# Patient Record
Sex: Male | Born: 1935 | Race: Black or African American | Hispanic: No | Marital: Single | Smoking: Former smoker
Health system: Southern US, Community
[De-identification: ages and names within clinical notes are randomized; demographics above are authoritative.]

## PROBLEM LIST (undated history)

## (undated) DIAGNOSIS — E785 Hyperlipidemia, unspecified: Secondary | ICD-10-CM

## (undated) DIAGNOSIS — K648 Other hemorrhoids: Secondary | ICD-10-CM

## (undated) DIAGNOSIS — N400 Enlarged prostate without lower urinary tract symptoms: Secondary | ICD-10-CM

## (undated) DIAGNOSIS — K219 Gastro-esophageal reflux disease without esophagitis: Secondary | ICD-10-CM

## (undated) DIAGNOSIS — R0989 Other specified symptoms and signs involving the circulatory and respiratory systems: Secondary | ICD-10-CM

## (undated) DIAGNOSIS — K5730 Diverticulosis of large intestine without perforation or abscess without bleeding: Secondary | ICD-10-CM

## (undated) DIAGNOSIS — M542 Cervicalgia: Secondary | ICD-10-CM

## (undated) DIAGNOSIS — Z87891 Personal history of nicotine dependence: Secondary | ICD-10-CM

## (undated) DIAGNOSIS — I2699 Other pulmonary embolism without acute cor pulmonale: Secondary | ICD-10-CM

## (undated) DIAGNOSIS — R519 Headache, unspecified: Secondary | ICD-10-CM

## (undated) DIAGNOSIS — Z86010 Personal history of colonic polyps: Secondary | ICD-10-CM

## (undated) DIAGNOSIS — I739 Peripheral vascular disease, unspecified: Secondary | ICD-10-CM

## (undated) DIAGNOSIS — D126 Benign neoplasm of colon, unspecified: Secondary | ICD-10-CM

## (undated) DIAGNOSIS — N419 Inflammatory disease of prostate, unspecified: Secondary | ICD-10-CM

## (undated) DIAGNOSIS — M1990 Unspecified osteoarthritis, unspecified site: Secondary | ICD-10-CM

## (undated) DIAGNOSIS — R109 Unspecified abdominal pain: Secondary | ICD-10-CM

## (undated) HISTORY — DX: Unspecified abdominal pain: R10.9

## (undated) HISTORY — DX: Peripheral vascular disease, unspecified: I73.9

## (undated) HISTORY — DX: Other specified symptoms and signs involving the circulatory and respiratory systems: R09.89

## (undated) HISTORY — DX: Other hemorrhoids: K64.8

## (undated) HISTORY — DX: Diverticulosis of large intestine without perforation or abscess without bleeding: K57.30

## (undated) HISTORY — DX: Benign neoplasm of colon, unspecified: D12.6

## (undated) HISTORY — PX: TRANSURETHRAL RESECTION OF PROSTATE: SHX73

## (undated) HISTORY — DX: Personal history of colonic polyps: Z86.010

## (undated) HISTORY — PX: OTHER SURGICAL HISTORY: SHX169

## (undated) HISTORY — DX: Hyperlipidemia, unspecified: E78.5

## (undated) HISTORY — DX: Gastro-esophageal reflux disease without esophagitis: K21.9

## (undated) HISTORY — DX: Inflammatory disease of prostate, unspecified: N41.9

## (undated) HISTORY — DX: Benign prostatic hyperplasia without lower urinary tract symptoms: N40.0

## (undated) HISTORY — DX: Headache, unspecified: R51.9

## (undated) HISTORY — DX: Personal history of nicotine dependence: Z87.891

## (undated) HISTORY — DX: Cervicalgia: M54.2

## (undated) HISTORY — DX: Unspecified osteoarthritis, unspecified site: M19.90

---

## 1993-02-11 ENCOUNTER — Encounter (INDEPENDENT_AMBULATORY_CARE_PROVIDER_SITE_OTHER): Payer: Self-pay | Admitting: Gastroenterology

## 2002-12-27 ENCOUNTER — Encounter: Payer: Self-pay | Admitting: Emergency Medicine

## 2002-12-27 ENCOUNTER — Emergency Department (HOSPITAL_COMMUNITY): Admission: EM | Admit: 2002-12-27 | Discharge: 2002-12-27 | Payer: Self-pay | Admitting: Emergency Medicine

## 2004-05-31 ENCOUNTER — Emergency Department (HOSPITAL_COMMUNITY): Admission: EM | Admit: 2004-05-31 | Discharge: 2004-05-31 | Payer: Self-pay

## 2004-08-30 ENCOUNTER — Ambulatory Visit (HOSPITAL_COMMUNITY): Admission: RE | Admit: 2004-08-30 | Discharge: 2004-08-30 | Payer: Self-pay | Admitting: Internal Medicine

## 2004-09-08 ENCOUNTER — Ambulatory Visit (HOSPITAL_COMMUNITY): Admission: RE | Admit: 2004-09-08 | Discharge: 2004-09-08 | Payer: Self-pay | Admitting: Internal Medicine

## 2005-03-07 ENCOUNTER — Ambulatory Visit: Payer: Self-pay | Admitting: Internal Medicine

## 2005-03-18 ENCOUNTER — Ambulatory Visit: Payer: Self-pay | Admitting: Cardiology

## 2005-07-04 ENCOUNTER — Ambulatory Visit: Payer: Self-pay | Admitting: Internal Medicine

## 2005-08-04 ENCOUNTER — Emergency Department (HOSPITAL_COMMUNITY): Admission: EM | Admit: 2005-08-04 | Discharge: 2005-08-04 | Payer: Self-pay | Admitting: Emergency Medicine

## 2005-11-14 ENCOUNTER — Ambulatory Visit: Payer: Self-pay | Admitting: Internal Medicine

## 2005-11-16 ENCOUNTER — Encounter: Payer: Self-pay | Admitting: Cardiology

## 2005-11-16 ENCOUNTER — Ambulatory Visit: Payer: Self-pay

## 2005-12-29 ENCOUNTER — Ambulatory Visit: Payer: Self-pay | Admitting: Internal Medicine

## 2006-02-21 ENCOUNTER — Ambulatory Visit: Payer: Self-pay | Admitting: Gastroenterology

## 2006-04-21 ENCOUNTER — Ambulatory Visit: Payer: Self-pay | Admitting: Internal Medicine

## 2006-04-26 ENCOUNTER — Ambulatory Visit: Payer: Self-pay | Admitting: *Deleted

## 2006-07-06 ENCOUNTER — Ambulatory Visit: Payer: Self-pay | Admitting: Internal Medicine

## 2006-10-03 ENCOUNTER — Ambulatory Visit: Payer: Self-pay | Admitting: Internal Medicine

## 2006-10-16 ENCOUNTER — Ambulatory Visit: Payer: Self-pay | Admitting: Gastroenterology

## 2006-10-27 ENCOUNTER — Ambulatory Visit: Payer: Self-pay | Admitting: Gastroenterology

## 2006-10-27 ENCOUNTER — Encounter: Payer: Self-pay | Admitting: Gastroenterology

## 2006-11-06 ENCOUNTER — Ambulatory Visit: Payer: Self-pay | Admitting: Internal Medicine

## 2006-12-21 ENCOUNTER — Ambulatory Visit: Payer: Self-pay | Admitting: Internal Medicine

## 2007-06-18 ENCOUNTER — Telehealth: Payer: Self-pay | Admitting: Internal Medicine

## 2007-06-20 ENCOUNTER — Encounter: Payer: Self-pay | Admitting: Internal Medicine

## 2007-07-27 ENCOUNTER — Ambulatory Visit: Payer: Self-pay | Admitting: Gastroenterology

## 2007-08-29 ENCOUNTER — Ambulatory Visit: Payer: Self-pay | Admitting: Gastroenterology

## 2008-01-04 ENCOUNTER — Encounter: Payer: Self-pay | Admitting: Gastroenterology

## 2008-01-04 DIAGNOSIS — N419 Inflammatory disease of prostate, unspecified: Secondary | ICD-10-CM

## 2008-01-04 DIAGNOSIS — K648 Other hemorrhoids: Secondary | ICD-10-CM

## 2008-01-04 DIAGNOSIS — D126 Benign neoplasm of colon, unspecified: Secondary | ICD-10-CM

## 2008-01-04 DIAGNOSIS — R109 Unspecified abdominal pain: Secondary | ICD-10-CM

## 2008-01-04 DIAGNOSIS — K5730 Diverticulosis of large intestine without perforation or abscess without bleeding: Secondary | ICD-10-CM | POA: Insufficient documentation

## 2008-01-04 HISTORY — DX: Benign neoplasm of colon, unspecified: D12.6

## 2008-01-04 HISTORY — DX: Inflammatory disease of prostate, unspecified: N41.9

## 2008-01-04 HISTORY — DX: Diverticulosis of large intestine without perforation or abscess without bleeding: K57.30

## 2008-01-04 HISTORY — DX: Other hemorrhoids: K64.8

## 2008-02-13 ENCOUNTER — Ambulatory Visit: Payer: Self-pay | Admitting: Internal Medicine

## 2008-02-13 DIAGNOSIS — N400 Enlarged prostate without lower urinary tract symptoms: Secondary | ICD-10-CM

## 2008-02-13 HISTORY — DX: Benign prostatic hyperplasia without lower urinary tract symptoms: N40.0

## 2008-07-16 ENCOUNTER — Ambulatory Visit: Payer: Self-pay | Admitting: Internal Medicine

## 2008-07-16 DIAGNOSIS — M65839 Other synovitis and tenosynovitis, unspecified forearm: Secondary | ICD-10-CM

## 2008-07-16 DIAGNOSIS — M65849 Other synovitis and tenosynovitis, unspecified hand: Secondary | ICD-10-CM

## 2008-08-13 ENCOUNTER — Encounter: Payer: Self-pay | Admitting: Internal Medicine

## 2008-08-14 ENCOUNTER — Ambulatory Visit: Payer: Self-pay | Admitting: Internal Medicine

## 2008-08-14 DIAGNOSIS — Z860100 Personal history of colon polyps, unspecified: Secondary | ICD-10-CM | POA: Insufficient documentation

## 2008-08-14 LAB — CONVERTED CEMR LAB
ALT: 12 units/L (ref 0–53)
AST: 21 units/L (ref 0–37)
Albumin: 3.6 g/dL (ref 3.5–5.2)
Alkaline Phosphatase: 68 units/L (ref 39–117)
BUN: 20 mg/dL (ref 6–23)
Basophils Absolute: 0 10*3/uL (ref 0.0–0.1)
Basophils Relative: 0.1 % (ref 0.0–3.0)
Bilirubin, Direct: 0.1 mg/dL (ref 0.0–0.3)
CO2: 29 meq/L (ref 19–32)
Calcium: 9.2 mg/dL (ref 8.4–10.5)
Chloride: 106 meq/L (ref 96–112)
Cholesterol: 188 mg/dL (ref 0–200)
Creatinine, Ser: 1.1 mg/dL (ref 0.4–1.5)
Eosinophils Absolute: 0.1 10*3/uL (ref 0.0–0.7)
Eosinophils Relative: 1.8 % (ref 0.0–5.0)
GFR calc Af Amer: 84 mL/min
GFR calc non Af Amer: 69 mL/min
Glucose, Bld: 84 mg/dL (ref 70–99)
HCT: 40.6 % (ref 39.0–52.0)
HDL: 71.2 mg/dL (ref >39.0)
Hemoglobin: 13.7 g/dL (ref 13.0–17.0)
LDL Cholesterol: 107 mg/dL — ABNORMAL HIGH (ref 0–99)
Lymphocytes Relative: 35.3 % (ref 12.0–46.0)
MCHC: 33.7 g/dL (ref 30.0–36.0)
MCV: 88.9 fL (ref 78.0–100.0)
Monocytes Absolute: 0.5 10*3/uL (ref 0.1–1.0)
Monocytes Relative: 10.9 % (ref 3.0–12.0)
Neutro Abs: 2.3 10*3/uL (ref 1.4–7.7)
Neutrophils Relative %: 51.9 % (ref 43.0–77.0)
PSA: 0.15 ng/mL (ref 0.10–4.00)
Platelets: 189 10*3/uL (ref 150–400)
Potassium: 3.9 meq/L (ref 3.5–5.1)
RBC: 4.57 M/uL (ref 4.22–5.81)
RDW: 13.2 % (ref 11.5–14.6)
Sodium: 141 meq/L (ref 135–145)
TSH: 1.24 microintl units/mL (ref 0.35–5.50)
Total Bilirubin: 0.9 mg/dL (ref 0.3–1.2)
Total CHOL/HDL Ratio: 2.6
Total Protein: 6.8 g/dL (ref 6.0–8.3)
Triglycerides: 48 mg/dL (ref 0–149)
VLDL: 10 mg/dL (ref 0–40)
WBC: 4.5 10*3/uL (ref 4.5–10.5)

## 2008-11-14 ENCOUNTER — Ambulatory Visit: Payer: Self-pay | Admitting: Internal Medicine

## 2008-11-14 DIAGNOSIS — R109 Unspecified abdominal pain: Secondary | ICD-10-CM | POA: Insufficient documentation

## 2008-11-17 ENCOUNTER — Ambulatory Visit: Payer: Self-pay | Admitting: Internal Medicine

## 2008-11-17 LAB — CONVERTED CEMR LAB
BUN: 15 mg/dL (ref 6–23)
BUN: 15 mg/dL (ref 6–23)
Creatinine, Ser: 1.1 mg/dL (ref 0.4–1.5)
Creatinine, Ser: 1 mg/dL (ref 0.4–1.5)

## 2008-11-19 ENCOUNTER — Ambulatory Visit: Payer: Self-pay | Admitting: Cardiology

## 2009-06-22 ENCOUNTER — Encounter: Payer: Self-pay | Admitting: Internal Medicine

## 2009-08-14 ENCOUNTER — Ambulatory Visit: Payer: Self-pay | Admitting: Internal Medicine

## 2009-08-14 DIAGNOSIS — Z87891 Personal history of nicotine dependence: Secondary | ICD-10-CM | POA: Insufficient documentation

## 2009-08-14 HISTORY — DX: Personal history of nicotine dependence: Z87.891

## 2009-08-14 LAB — CONVERTED CEMR LAB
ALT: 14 units/L (ref 0–53)
AST: 20 units/L (ref 0–37)
Albumin: 3.6 g/dL (ref 3.5–5.2)
Alkaline Phosphatase: 73 units/L (ref 39–117)
BUN: 15 mg/dL (ref 6–23)
Basophils Absolute: 0 10*3/uL (ref 0.0–0.1)
Basophils Relative: 0.6 % (ref 0.0–3.0)
Bilirubin, Direct: 0.1 mg/dL (ref 0.0–0.3)
CO2: 22 meq/L (ref 19–32)
Calcium: 9.4 mg/dL (ref 8.4–10.5)
Chloride: 104 meq/L (ref 96–112)
Cholesterol: 203 mg/dL — ABNORMAL HIGH (ref 0–200)
Creatinine, Ser: 1.2 mg/dL (ref 0.4–1.5)
Direct LDL: 111 mg/dL
Eosinophils Absolute: 0.1 10*3/uL (ref 0.0–0.7)
Eosinophils Relative: 1.6 % (ref 0.0–5.0)
GFR calc non Af Amer: 75.62 mL/min (ref >60)
Glucose, Bld: 108 mg/dL — ABNORMAL HIGH (ref 70–99)
HCT: 41 % (ref 39.0–52.0)
HDL: 78.9 mg/dL (ref >39.00)
Hemoglobin: 13.7 g/dL (ref 13.0–17.0)
Lymphocytes Relative: 26.7 % (ref 12.0–46.0)
Lymphs Abs: 1.4 10*3/uL (ref 0.7–4.0)
MCHC: 33.4 g/dL (ref 30.0–36.0)
MCV: 90.4 fL (ref 78.0–100.0)
Monocytes Absolute: 0.4 10*3/uL (ref 0.1–1.0)
Monocytes Relative: 7.7 % (ref 3.0–12.0)
Neutro Abs: 3.5 10*3/uL (ref 1.4–7.7)
Neutrophils Relative %: 63.4 % (ref 43.0–77.0)
Platelets: 200 10*3/uL (ref 150.0–400.0)
Potassium: 4.5 meq/L (ref 3.5–5.1)
RBC: 4.53 M/uL (ref 4.22–5.81)
RDW: 13.6 % (ref 11.5–14.6)
Sodium: 139 meq/L (ref 135–145)
TSH: 0.78 microintl units/mL (ref 0.35–5.50)
Total Bilirubin: 0.9 mg/dL (ref 0.3–1.2)
Total CHOL/HDL Ratio: 3
Total Protein: 6.6 g/dL (ref 6.0–8.3)
Triglycerides: 50 mg/dL (ref 0.0–149.0)
VLDL: 10 mg/dL (ref 0.0–40.0)
WBC: 5.4 10*3/uL (ref 4.5–10.5)

## 2009-10-07 ENCOUNTER — Encounter (INDEPENDENT_AMBULATORY_CARE_PROVIDER_SITE_OTHER): Payer: Self-pay | Admitting: *Deleted

## 2010-02-03 ENCOUNTER — Encounter: Payer: Self-pay | Admitting: Internal Medicine

## 2010-05-26 ENCOUNTER — Encounter: Payer: Self-pay | Admitting: Internal Medicine

## 2010-06-30 ENCOUNTER — Ambulatory Visit: Payer: Self-pay | Admitting: Internal Medicine

## 2010-06-30 DIAGNOSIS — J029 Acute pharyngitis, unspecified: Secondary | ICD-10-CM

## 2010-06-30 DIAGNOSIS — M542 Cervicalgia: Secondary | ICD-10-CM | POA: Insufficient documentation

## 2010-06-30 LAB — CONVERTED CEMR LAB: Rapid Strep: NEGATIVE

## 2010-07-01 ENCOUNTER — Encounter: Payer: Self-pay | Admitting: Internal Medicine

## 2010-07-01 DIAGNOSIS — R0989 Other specified symptoms and signs involving the circulatory and respiratory systems: Secondary | ICD-10-CM

## 2010-07-02 ENCOUNTER — Ambulatory Visit: Payer: Self-pay

## 2010-07-02 ENCOUNTER — Encounter: Payer: Self-pay | Admitting: Internal Medicine

## 2010-08-17 ENCOUNTER — Ambulatory Visit: Payer: Self-pay | Admitting: Internal Medicine

## 2010-08-17 ENCOUNTER — Encounter: Payer: Self-pay | Admitting: Internal Medicine

## 2010-08-17 DIAGNOSIS — I739 Peripheral vascular disease, unspecified: Secondary | ICD-10-CM | POA: Insufficient documentation

## 2010-08-17 HISTORY — DX: Peripheral vascular disease, unspecified: I73.9

## 2010-08-17 LAB — CONVERTED CEMR LAB
ALT: 13 units/L (ref 0–53)
AST: 22 units/L (ref 0–37)
Albumin: 3.6 g/dL (ref 3.5–5.2)
Alkaline Phosphatase: 87 units/L (ref 39–117)
BUN: 17 mg/dL (ref 6–23)
Basophils Absolute: 0 10*3/uL (ref 0.0–0.1)
Basophils Relative: 0.3 % (ref 0.0–3.0)
Bilirubin, Direct: 0.1 mg/dL (ref 0.0–0.3)
CO2: 29 meq/L (ref 19–32)
Calcium: 9.2 mg/dL (ref 8.4–10.5)
Chloride: 104 meq/L (ref 96–112)
Cholesterol: 187 mg/dL (ref 0–200)
Creatinine, Ser: 1 mg/dL (ref 0.4–1.5)
Eosinophils Absolute: 0.1 10*3/uL (ref 0.0–0.7)
Eosinophils Relative: 1.2 % (ref 0.0–5.0)
GFR calc non Af Amer: 89.96 mL/min (ref >60)
Glucose, Bld: 92 mg/dL (ref 70–99)
HCT: 39.2 % (ref 39.0–52.0)
HDL: 74.7 mg/dL (ref >39.00)
Hemoglobin: 13 g/dL (ref 13.0–17.0)
LDL Cholesterol: 104 mg/dL — ABNORMAL HIGH (ref 0–99)
Lymphocytes Relative: 33.5 % (ref 12.0–46.0)
Lymphs Abs: 1.6 10*3/uL (ref 0.7–4.0)
MCHC: 33.3 g/dL (ref 30.0–36.0)
MCV: 89.3 fL (ref 78.0–100.0)
Monocytes Absolute: 0.5 10*3/uL (ref 0.1–1.0)
Monocytes Relative: 11.3 % (ref 3.0–12.0)
Neutro Abs: 2.5 10*3/uL (ref 1.4–7.7)
Neutrophils Relative %: 53.7 % (ref 43.0–77.0)
Platelets: 201 10*3/uL (ref 150.0–400.0)
Potassium: 4.4 meq/L (ref 3.5–5.1)
RBC: 4.39 M/uL (ref 4.22–5.81)
RDW: 14.4 % (ref 11.5–14.6)
Sodium: 138 meq/L (ref 135–145)
TSH: 0.93 microintl units/mL (ref 0.35–5.50)
Total Bilirubin: 0.6 mg/dL (ref 0.3–1.2)
Total CHOL/HDL Ratio: 3
Total Protein: 6.6 g/dL (ref 6.0–8.3)
Triglycerides: 42 mg/dL (ref 0.0–149.0)
VLDL: 8.4 mg/dL (ref 0.0–40.0)
WBC: 4.7 10*3/uL (ref 4.5–10.5)

## 2010-11-17 ENCOUNTER — Ambulatory Visit
Admission: RE | Admit: 2010-11-17 | Discharge: 2010-11-17 | Payer: Self-pay | Source: Home / Self Care | Attending: Internal Medicine | Admitting: Internal Medicine

## 2010-11-17 DIAGNOSIS — K219 Gastro-esophageal reflux disease without esophagitis: Secondary | ICD-10-CM | POA: Insufficient documentation

## 2010-11-17 HISTORY — DX: Gastro-esophageal reflux disease without esophagitis: K21.9

## 2010-12-09 NOTE — Assessment & Plan Note (Signed)
Summary: CPX (PT WILL COME IN FASTING) // RS----PT [facility] // RS   Vital Signs:  Patient profile:   77 year old male Height:      70 inches Weight:      178 pounds BMI:     25.63 Temp:     98.3 degrees F oral BP sitting:   120 / 80  (left arm) Cuff size:   regular  Vitals Entered By: Duard Brady LPN (August 17, 2010 8:43 AM) CC: cpx - doing well Is Patient Diabetic? No   CC:  cpx - doing well.  History of Present Illness: 77 year old patient who is seen today for a comprehensive evaluation.  He is doing quite well and denies any focal neurological symptoms or cardiopulmonary complaints.  He does have a history of colonic polyps BPH. he has a history of diverticulosis and colonic polyps.  His last colonoscopy was 4 years ago.  Remains on alpha-blocker for BPH and remained stable Here for Medicare AWV:  1.   Risk factors based on Past M, S, F history:   vascular risk factors include family history of coronary artery disease;  he has a history of peripheral vascular occlusive disease 2.   Physical Activities: remains quite active and physical sense;  continues to work occasionally 3.   Depression/mood: no history of depression or mood disorder 4.   Hearing: no hearing deficits 5.   ADL's: remains independent in all aspects of daily living 6.   Fall Risk: low 7.   Home Safety: no polyps identified 8.   Height, weight, &visual acuity:height and weight stable.  No difficulty with visual acuity 9.   Counseling: heart healthy diet regular.  Exercise encouraged 10.   Labs ordered based on risk factors: laboratory profile will be reviewed, including lipid profile 11.           Referral Coordination- and appropriate at this time.  Will consider colonoscopy in one year 12.           Care Plan- exercise heart healthy diet will be continued.  His medical regimen will be unchanged.  This includes an alpha blocker only for BPH 13.            Cognitive Assessment- alert and oriented, with  normal affect.  No memory difficulties.  Able to handle all executive functions without difficulty   Allergies (verified): No Known Drug Allergies  Past History:  Past Medical History: left lower quadrant pain Benign prostatic hypertrophy Colonic polyps, hx of erectile dysfunction urge incontinence Diverticulosis, colon complete right ICA occlusion  Past Surgical History: Reviewed history from 08/14/2009 and no changes required. circumcision at age 89 status post transurethral destruction of the prostate by radiofrequency thermotherapy colonoscopy December 2007  Family History: Reviewed history from 08/14/2009 and no changes required. father died age 43 mother died age 3 MI  Four brothers and  9 sisters; positive for coronary artery disease, and possible stomach cancer;   2 B, 3 S deceased   Social History: Reviewed history from 08/14/2008 and no changes required. Married Regular exercise-yes  Review of Systems  The patient denies anorexia, fever, weight loss, weight gain, vision loss, decreased hearing, hoarseness, chest pain, syncope, dyspnea on exertion, peripheral edema, prolonged cough, headaches, hemoptysis, abdominal pain, melena, hematochezia, severe indigestion/heartburn, hematuria, incontinence, genital sores, muscle weakness, suspicious skin lesions, transient blindness, difficulty walking, depression, unusual weight change, abnormal bleeding, enlarged lymph nodes, angioedema, breast masses, and testicular masses.    Physical Exam  General:  Well-developed,well-nourished,in no acute distress; alert,appropriate and cooperative throughout examination Head:  Normocephalic and atraumatic without obvious abnormalities. No apparent alopecia or balding. Eyes:  No corneal or conjunctival inflammation noted. EOMI. Perrla. Funduscopic exam benign, without hemorrhages, exudates or papilledema. Vision grossly normal. Ears:  External ear exam shows no significant lesions  or deformities.  Otoscopic examination reveals clear canals, tympanic membranes are intact bilaterally without bulging, retraction, inflammation or discharge. Hearing is grossly normal bilaterally. Mouth:  Oral mucosa and oropharynx without lesions or exudates.   Neck:  markedly decreased right carotid upstroke Chest Wall:  No deformities, masses, tenderness or gynecomastia noted. Breasts:  No masses or gynecomastia noted Lungs:  Normal respiratory effort, chest expands symmetrically. Lungs are clear to auscultation, no crackles or wheezes. Heart:  Normal rate and regular rhythm. S1 and S2 normal without gallop, murmur, click, rub or other extra sounds. Abdomen:  Bowel sounds positive,abdomen soft and non-tender without masses, organomegaly or hernias noted. Rectal:  No external abnormalities noted. Normal sphincter tone. No rectal masses or tenderness. Genitalia:  Testes bilaterally descended without nodularity, tenderness or masses. No scrotal masses or lesions. No penis lesions or urethral discharge. Prostate:  3+ enlarged.  3+ enlarged.   Msk:  No deformity or scoliosis noted of thoracic or lumbar spine.   Pulses:  pedal pulses nonpalpable Extremities:  No clubbing, cyanosis, edema, or deformity noted with normal full range of motion of all joints.   Neurologic:  No cranial nerve deficits noted. Station and gait are normal. Plantar reflexes are down-going bilaterally. DTRs are symmetrical throughout. Sensory, motor and coordinative functions appear intact. Skin:  Intact without suspicious lesions or rashes Cervical Nodes:  No lymphadenopathy noted Axillary Nodes:  No palpable lymphadenopathy Inguinal Nodes:  No significant adenopathy Psych:  Cognition and judgment appear intact. Alert and cooperative with normal attention span and concentration. No apparent delusions, illusions, hallucinations   Impression & Recommendations:  Problem # 1:  HEALTH SCREENING (ICD-V70.0)  Problem # 2:   COLONIC POLYPS, HX OF (ICD-V12.72)  Problem # 3:  PVD (ICD-443.9)  Problem # 4:  BENIGN PROSTATIC HYPERTROPHY (ICD-600.00)  His updated medication list for this problem includes:    Tamsulosin Hcl 0.4 Mg Caps (Tamsulosin hcl) .Marland Kitchen... 1 once daily    His updated medication list for this problem includes:    Tamsulosin Hcl 0.4 Mg Caps (Tamsulosin hcl) .Marland Kitchen... 1 once daily  Orders: Venipuncture (57846) TLB-Lipid Panel (80061-LIPID) TLB-BMP (Basic Metabolic Panel-BMET) (80048-METABOL) TLB-CBC Platelet - w/Differential (85025-CBCD) TLB-Hepatic/Liver Function Pnl (80076-HEPATIC) TLB-TSH (Thyroid Stimulating Hormone) (84443-TSH) Specimen Handling (96295)  Complete Medication List: 1)  Tamsulosin Hcl 0.4 Mg Caps (Tamsulosin hcl) .Marland Kitchen.. 1 once daily 2)  Cialis 10 Mg Tabs (Tadalafil) .... Use daily as directed as needed 3)  Pravastatin Sodium 20 Mg Tabs (Pravastatin sodium) .... One daily 4)  Aspir-low 81 Mg Tbec (Aspirin) .... One daily  Other Orders: Flu Vaccine 34yrs + (28413) Admin 1st Vaccine (24401) EKG w/ Interpretation (93000) Medicare -1st Annual Wellness Visit [phone])  Patient Instructions: 1)  Please schedule a follow-up appointment in 3  months. 2)  Limit your Sodium (Salt). 3)  It is important that you exercise regularly at least 20 minutes 5 times a week. If you develop chest pain, have severe difficulty breathing, or feel very tired , stop exercising immediately and seek medical attention. Prescriptions: PRAVASTATIN SODIUM 20 MG TABS (PRAVASTATIN SODIUM) one daily  #90 x 2   Entered and Authorized by:   Gordy Savers  MD  Signed by:   Gordy Savers  MD on 08/17/2010   Method used:   Print then Give to Patient   RxID:   [id number] CIALIS 10 MG TABS (TADALAFIL) use daily as directed as needed  #6 x 6   Entered and Authorized by:   Gordy Savers  MD   Signed by:   Gordy Savers  MD on 08/17/2010   Method used:   Print then Give to Patient    RxID:   [id number] TAMSULOSIN HCL 0.4 MG CAPS (TAMSULOSIN HCL) 1 once daily  #90 x 6   Entered and Authorized by:   Gordy Savers  MD   Signed by:   Gordy Savers  MD on 08/17/2010   Method used:   Print then Give to Patient   RxID:   [id number]    Immunizations Administered:  Influenza Vaccine # 1:    Vaccine Type: Fluvax 3+    Site: left deltoid    Mfr: GlaxoSmithKline    Dose: 0.5 ml    Route: IM    Given by: Duard Brady LPN    Exp. Date: 05/07/2011    Lot #: [id number]    VIS given: 06/01/10 version given August 17, 2010.    Physician counseled: yes  Flu Vaccine Consent Questions:    Do you have a history of severe allergic reactions to this vaccine? no    Any prior history of allergic reactions to egg and/or gelatin? no    Do you have a sensitivity to the preservative Thimersol? no    Do you have a past history of Guillan-Barre Syndrome? no    Do you currently have an acute febrile illness? no    Have you ever had a severe reaction to latex? no    Vaccine information given and explained to patient? yes

## 2010-12-09 NOTE — Assessment & Plan Note (Signed)
Summary: 3 MONTH FOLLOW UP/CJR   Vital Signs:  Patient profile:   77 year old male Weight:      178 pounds Temp:     98.0 degrees F oral BP sitting:   110 / 68  (left arm) Cuff size:   regular  Vitals Entered By: Duard Brady LPN (November 17, 2010 8:16 AM) CC: 3 mos rov - doing well Is Patient Diabetic? No   CC:  3 mos rov - doing well.  History of Present Illness:  77 year old patient who has a history of hypertension, carotid artery stenosis.  He is doing quite well and denies any focal neurological symptoms.  He has a history of colonic polyps.  Denies any exertional chest pain.  He remains on statin therapy, which he continues to tolerate well.  Only complaints are occasional neck pain that has been chronic and more recently has had some fairly modest reflux symptoms  Allergies (verified): No Known Drug Allergies  Past History:  Past Medical History: Reviewed history from 08/17/2010 and no changes required. left lower quadrant pain Benign prostatic hypertrophy Colonic polyps, hx of erectile dysfunction urge incontinence Diverticulosis, colon complete right ICA occlusion  Review of Systems  The patient denies anorexia, fever, weight loss, weight gain, vision loss, decreased hearing, hoarseness, chest pain, syncope, dyspnea on exertion, peripheral edema, prolonged cough, headaches, hemoptysis, abdominal pain, melena, hematochezia, severe indigestion/heartburn, hematuria, incontinence, genital sores, muscle weakness, suspicious skin lesions, transient blindness, difficulty walking, depression, unusual weight change, abnormal bleeding, enlarged lymph nodes, angioedema, breast masses, and testicular masses.    Physical Exam  General:  Well-developed,well-nourished,in no acute distress; alert,appropriate and cooperative throughout examination Head:  Normocephalic and atraumatic without obvious abnormalities. No apparent alopecia or balding. Eyes:  No corneal or  conjunctival inflammation noted. EOMI. Perrla. Funduscopic exam benign, without hemorrhages, exudates or papilledema. Vision grossly normal. Mouth:  Oral mucosa and oropharynx without lesions or exudates.  Teeth in good repair. Neck:  No deformities, masses, or tenderness noted. Lungs:  Normal respiratory effort, chest expands symmetrically. Lungs are clear to auscultation, no crackles or wheezes. Heart:  Normal rate and regular rhythm. S1 and S2 normal without gallop, murmur, click, rub or other extra sounds. Abdomen:  Bowel sounds positive,abdomen soft and non-tender without masses, organomegaly or hernias noted. Msk:  No deformity or scoliosis noted of thoracic or lumbar spine.   Extremities:  No clubbing, cyanosis, edema, or deformity noted with normal full range of motion of all joints.   Skin:  Intact without suspicious lesions or rashes Cervical Nodes:  No lymphadenopathy noted   Impression & Recommendations:  Problem # 1:  PVD (ICD-443.9)  Problem # 2:  NECK PAIN (ICD-723.1)  His updated medication list for this problem includes:    Aspir-low 81 Mg Tbec (Aspirin) ..... One daily  His updated medication list for this problem includes:    Aspir-low 81 Mg Tbec (Aspirin) ..... One daily  Problem # 3:  GERD (ICD-530.81) anti-reflux regimen discuss  Complete Medication List: 1)  Tamsulosin Hcl 0.4 Mg Caps (Tamsulosin hcl) .Marland Kitchen.. 1 once daily 2)  Cialis 10 Mg Tabs (Tadalafil) .... Use daily as directed as needed 3)  Pravastatin Sodium 20 Mg Tabs (Pravastatin sodium) .... One daily 4)  Aspir-low 81 Mg Tbec (Aspirin) .... One daily  Patient Instructions: 1)  Please schedule a follow-up appointment in 6 months. 2)  Limit your Sodium (Salt) to less than 2 grams a day(slightly less than 1/2 a teaspoon) to prevent fluid retention, swelling,  or worsening of symptoms. 3)  Avoid foods high in acid (tomatoes, citrus juices, spicy foods). Avoid eating within two hours of lying down or before  exercising. Do not over eat; try smaller more frequent meals. Elevate head of bed twelve inches when sleeping. 4)  It is important that you exercise regularly at least 20 minutes 5 times a week. If you develop chest pain, have severe difficulty breathing, or feel very tired , stop exercising immediately and seek medical attention. 5)  consider pepcid AC or OTC Prilosec 20 Prescriptions: PRAVASTATIN SODIUM 20 MG TABS (PRAVASTATIN SODIUM) one daily  #90 x 6   Entered and Authorized by:   Gordy Savers  MD   Signed by:   Gordy Savers  MD on 11/17/2010   Method used:   Print then Give to Patient   RxID:   [id number] CIALIS 10 MG TABS (TADALAFIL) use daily as directed as needed  #6 x 6   Entered and Authorized by:   Gordy Savers  MD   Signed by:   Gordy Savers  MD on 11/17/2010   Method used:   Print then Give to Patient   RxID:   [id number] TAMSULOSIN HCL 0.4 MG CAPS (TAMSULOSIN HCL) 1 once daily  #90 Capsule x 6   Entered and Authorized by:   Gordy Savers  MD   Signed by:   Gordy Savers  MD on 11/17/2010   Method used:   Print then Give to Patient   RxID:   [id number]    Orders Added: 1)  Est. Patient Level III [01027]

## 2010-12-09 NOTE — Letter (Signed)
Summary: [facility]  [facility]   Imported By: Maryln Gottron 05/31/2010 15:59:21  _____________________________________________________________________  External Attachment:    Type:   Image     Comment:   External Document

## 2010-12-09 NOTE — Letter (Signed)
Summary: [facility]  [facility]   Imported By: Maryln Gottron 02/05/2010 15:32:31  _____________________________________________________________________  External Attachment:    Type:   Image     Comment:   External Document

## 2010-12-09 NOTE — Miscellaneous (Signed)
Summary: Orders Update  Clinical Lists Changes  Problems: Added new problem of OTHER SYMPTOMS INVOLVING CARDIOVASCULAR SYSTEM (ICD-785.9) Orders: Added new Test order of Carotid Duplex (Carotid Duplex) - Signed 

## 2010-12-09 NOTE — Assessment & Plan Note (Signed)
Summary: SORE THROAT // RS   Vital Signs:  Patient profile:   77 year old male Weight:      176 pounds Temp:     97.5 degrees F oral BP sitting:   122 / 62  (left arm) Cuff size:   regular  Vitals Entered By: Kathrynn Speed CMA (June 30, 2010 9:14 AM) CC: Sore throat, stiff neck, sore muscle on righ side of neck, x 2 months,dog bite, last week, on left knee,src Is Patient Diabetic? No   CC:  Sore throat, stiff neck, sore muscle on righ side of neck, x 2 months, dog bite, last week, on left knee, and src.  History of Present Illness: 77 year old patient who presents with a two month history of pain in the anterior neck area.  He describes this is a muscle spasm and pain is aggravated by side to side movement.  He denies any hoarseness or swallowing difficulty.  It is a remote history of tobacco use.  When his anterior neck is painful side to side movement does aggravate the pain and he does note a decreased range of motion.  Current Medications (verified): 1)  Tamsulosin Hcl 0.4 Mg Caps (Tamsulosin Hcl) .Marland Kitchen.. 1 Once Daily 2)  Cialis 10 Mg Tabs (Tadalafil) .... Use Daily As Directed As Needed  Allergies (verified): No Known Drug Allergies  Past History:  Past Medical History: Reviewed history from 08/14/2009 and no changes required. left lower quadrant pain Benign prostatic hypertrophy Colonic polyps, hx of erectile dysfunction urge incontinence Diverticulosis, colon  Review of Systems  The patient denies anorexia, fever, weight loss, weight gain, vision loss, decreased hearing, hoarseness, chest pain, syncope, dyspnea on exertion, peripheral edema, prolonged cough, headaches, hemoptysis, abdominal pain, melena, hematochezia, severe indigestion/heartburn, hematuria, incontinence, genital sores, muscle weakness, suspicious skin lesions, transient blindness, difficulty walking, depression, unusual weight change, abnormal bleeding, enlarged lymph nodes, angioedema, breast masses,  and testicular masses.    Physical Exam  General:  Well-developed,well-nourished,in no acute distress; alert,appropriate and cooperative throughout examination Head:  Normocephalic and atraumatic without obvious abnormalities. No apparent alopecia or balding. Eyes:  No corneal or conjunctival inflammation noted. EOMI. Perrla. Funduscopic exam benign, without hemorrhages, exudates or papilledema. Vision grossly normal. Ears:  External ear exam shows no significant lesions or deformities.  Otoscopic examination reveals clear canals, tympanic membranes are intact bilaterally without bulging, retraction, inflammation or discharge. Hearing is grossly normal bilaterally. Mouth:  Oral mucosa and oropharynx without lesions or exudates.  Teeth in good repair. Neck:  the right carotid upstroke appeared to be diminished.  No bruits were noted; there was a suggestion of a small nodule in the anterior right neck region.  This was felt to be the carotid artery that was not very pulsatile Lungs:  Normal respiratory effort, chest expands symmetrically. Lungs are clear to auscultation, no crackles or wheezes. Heart:  Normal rate and regular rhythm. S1 and S2 normal without gallop, murmur, click, rub or other extra sounds.   Impression & Recommendations:  Problem # 1:  NECK PAIN (ICD-723.1)  Orders: Doppler Referral (Doppler)  Problem # 2:  TOBACCO USE, QUIT (ICD-V15.82)  Complete Medication List: 1)  Tamsulosin Hcl 0.4 Mg Caps (Tamsulosin hcl) .Marland Kitchen.. 1 once daily 2)  Cialis 10 Mg Tabs (Tadalafil) .... Use daily as directed as needed  Other Orders: Rapid Strep (59563)  Patient Instructions: 1)  Please schedule a follow-up appointment in 2 months CPX 2)  Advised not to eat any food or drink any liquids after 10  PM the night before your procedure. 3)  Limit your Sodium (Salt). 4)  It is important that you exercise regularly at least 20 minutes 5 times a week. If you develop chest pain, have severe  difficulty breathing, or feel very tired , stop exercising immediately and seek medical attention. 5)  carotid artery.  Doppler study as scheduled Prescriptions: CIALIS 10 MG TABS (TADALAFIL) use daily as directed as needed  #6 x 6   Entered and Authorized by:   Gordy Savers  MD   Signed by:   Gordy Savers  MD on 06/30/2010   Method used:   Print then Give to Patient   RxID:   [id number] TAMSULOSIN HCL 0.4 MG CAPS (TAMSULOSIN HCL) 1 once daily  #90 x 6   Entered and Authorized by:   Gordy Savers  MD   Signed by:   Gordy Savers  MD on 06/30/2010   Method used:   Print then Give to Patient   RxID:   [id number]    Laboratory Results  Date/Time Received: June 30, 2010  Date/Time Reported: June 30, 2010   Other Tests  Rapid Strep: negative Comments: Kathrynn Speed CMA  June 30, 2010 9:36 AM

## 2011-03-22 NOTE — Assessment & Plan Note (Signed)
[facility]                         GASTROENTEROLOGY OFFICE NOTE   ALPHONSUS, DOYEL                      MRN:          [id number]  DATE:08/29/2007                            DOB:          [birth year]    PROBLEM:  Abdominal pain.   Connor Neal has returned for scheduled followup. On Levbid twice a day  he is significantly improved. He has occasional lower abdominal  discomfort with straining. He has had no change in his bowel habits and  altogether feels well.   PHYSICAL EXAMINATION:  Pulse 66, blood pressure 158/78, weight 184.   IMPRESSION:  Nonspecific lower abdominal pain, resolved.   RECOMMENDATIONS:  Discontinue Levbid and use p.r.n. I did instruct Mr.  Phenix to contact me if he finds that he has recurrent and persistent  pain off of his Levbid at which point I would consider CT of the  abdomen.     Barbette Hair. Arlyce Dice, MD,FACG  Electronically Signed    RDK/MedQ  DD: 08/29/2007  DT: 08/29/2007  Job #: [id number]   cc:   Gordy Savers, MD

## 2011-03-22 NOTE — Assessment & Plan Note (Signed)
[facility]                         GASTROENTEROLOGY OFFICE NOTE   SEVERIANO, UTSEY                      MRN:          [id number]  DATE:07/27/2007                            DOB:          1931/10/21    PROBLEM:  Abdominal pain.   Mr. Cowans has returned again complaining of abdominal pain.  He has  spontaneous mild aching left lower quadrant pain.  It is unrelated to  eating or bowel movements.  There has been no change in his bowel  habits.  Colonoscopy in December 2007 was remarkable for a few sigmoid  diverticula.  A nonadenomatous polyp was removed.  There is no history  of melena or hematochezia.  He has been taking Librax with some relief.  He moves his bowels regularly.   His only other medication is Flomax.   On exam, pulse 68, blood pressure 136/78, weight 185.  Abdomen is  without mass, tenderness or organomegaly.   IMPRESSION:  Nonspecific lower abdominal pain.  I suspect this is  related to spasm.   RECOMMENDATIONS:  1. Fiber supplementation daily.  2. Trial of Levbid 0.275 mg twice daily.     Barbette Hair. Arlyce Dice, MD,FACG  Electronically Signed    RDK/MedQ  DD: 07/27/2007  DT: 07/27/2007  Job #: [id number]   cc:   Gordy Savers, MD

## 2011-03-25 NOTE — Assessment & Plan Note (Signed)
[facility] OFFICE NOTE   Connor Neal, Connor Neal                      MRN:          [id number]  DATE:10/03/2006                            DOB:          1931/06/24    The patient is a 77 year old black gentleman who is seen today to re-  establish with our practice.  No old medical records are available.  He  states for a number of months he has been having some chronic abdominal  pain.  He did have a CT of the abdomen and pelvis in June of this year  that was unremarkable.  He states that he feels that he had a  colonoscopy approximately 4 years ago.   PAST MEDICAL HISTORY:  Fairly unremarkable.  He states that he was  hospitalized by me in 1998 for some GI disorder.  He had an outpatient  circumcision at age 33, otherwise does not recall any hospital  admissions.   HE HAS NO KNOWN ALLERGIES.  He has a number of p.r.n. medications  including ibuprofen, Benadryl, Prilosec and Pepcid.   FAMILY HISTORY:  Father died at 34.  Mother died at 36 of an apparent  MI.  One of 4 brothers deceased from coronary artery disease.  One of 9  sisters deceased from stomach cancer.  No diabetes or other family  history of cancer.   EXAM:  Revealed a healthy appearing, fit, black male in no acute  distress.  Blood pressure was 150/80.  FUNDI, EAR, NOSE AND THROAT:  Unremarkable.  NECK:  No bruits.  CHEST:  Clear.  CARDIOVASCULAR:  Normal heart sounds, no murmurs.  ABDOMEN:  Benign.  No organomegaly.  EXTERNAL GENITALIA:  Negative.  PROSTATE:  Moderately large and benign.  Stool heme negative.  EXTREMITIES:  Reveal diminished pedal pulses.   IMPRESSION:  1. Chronic abdominal pain.  2. Benign prostatic hypertrophy with nocturia.   DISPOSITION:  1. Will place the patient on Cardura 2 mg at bedtime.  2. Will recheck in 4 weeks to assess his blood pressure and response.  3. A colonoscopy will be set up as well as screening  laboratory data.    Gordy Savers, MD  Electronically Signed   PFK/MedQ  DD: 10/03/2006  DT: 10/03/2006  Job #: [id number]

## 2011-03-30 ENCOUNTER — Encounter: Payer: Self-pay | Admitting: Internal Medicine

## 2011-03-30 ENCOUNTER — Ambulatory Visit (INDEPENDENT_AMBULATORY_CARE_PROVIDER_SITE_OTHER): Payer: 59 | Admitting: Internal Medicine

## 2011-03-30 DIAGNOSIS — R208 Other disturbances of skin sensation: Secondary | ICD-10-CM

## 2011-03-30 DIAGNOSIS — R109 Unspecified abdominal pain: Secondary | ICD-10-CM

## 2011-03-30 DIAGNOSIS — I739 Peripheral vascular disease, unspecified: Secondary | ICD-10-CM

## 2011-03-30 DIAGNOSIS — R209 Unspecified disturbances of skin sensation: Secondary | ICD-10-CM

## 2011-03-30 MED ORDER — TRAMADOL HCL 50 MG PO TABS: 50.0000 mg | ORAL_TABLET | Freq: Four times a day (QID) | ORAL | Status: AC | PRN

## 2011-03-30 NOTE — Patient Instructions (Signed)
Call or return to clinic prn if these symptoms worsen or fail to improve as anticipated.  Return in October for your annual exam as scheduled

## 2011-03-30 NOTE — Progress Notes (Signed)
  Subjective:    Patient ID: Connor Neal, male    DOB: 06-Aug-1931, 77 y.o.   MRN: [id number]  HPI  77 year old patient who has a history of chronic abdominal pain. This is more marked on the left side. He states he has a difficult time sleeping at night on his left side. Over the years he is said to CT scans of the abdomen and pelvis that were nonrevealing. He is up-to-date on his colonoscopies and does have a history of colonic polyps. He also complains of some tingling and dysesthesia involving his left foot and ankle. He is concerned about the possibility of diabetes. Laboratory studies were reviewed from October of last year blood sugar was normal denies any weight loss or other constitutional complaints   Review of Systems  Constitutional: Negative for fever, chills, appetite change and fatigue.  HENT: Negative for hearing loss, ear pain, congestion, sore throat, trouble swallowing, neck stiffness, dental problem, voice change and tinnitus.   Eyes: Negative for pain, discharge and visual disturbance.  Respiratory: Negative for cough, chest tightness, wheezing and stridor.   Cardiovascular: Negative for chest pain, palpitations and leg swelling.  Gastrointestinal: Positive for abdominal pain. Negative for nausea, vomiting, diarrhea, constipation, blood in stool and abdominal distention.  Genitourinary: Negative for urgency, hematuria, flank pain, discharge, difficulty urinating and genital sores.  Musculoskeletal: Negative for myalgias, back pain, joint swelling, arthralgias and gait problem.  Skin: Negative for rash.  Neurological: Positive for numbness. Negative for dizziness, syncope, speech difficulty, weakness and headaches.  Hematological: Negative for adenopathy. Does not bruise/bleed easily.  Psychiatric/Behavioral: Negative for behavioral problems and dysphoric mood. The patient is not nervous/anxious.        Objective:   Physical Exam  Constitutional: He is oriented to person,  place, and time. He appears well-developed.  HENT:  Head: Normocephalic.  Right Ear: External ear normal.  Left Ear: External ear normal.  Eyes: Conjunctivae and EOM are normal.  Neck: Normal range of motion.  Cardiovascular: Normal rate and normal heart sounds.        Pedal pulses on the left were not easily palpable  Pulmonary/Chest: Breath sounds normal.  Abdominal: Bowel sounds are normal.  Musculoskeletal: Normal range of motion. He exhibits no edema and no tenderness.  Neurological: He is alert and oriented to person, place, and time.       The left ear was examined and had to vibratory sensation and monofilament testing Achilles reflex was normal. Motor strength appeared normal  Psychiatric: He has a normal mood and affect. His behavior is normal.          Assessment & Plan:   Chronic abdominal pain. Was given a prescription for tramadol to take when necessary. He will call if he worsens Left foot dysesthesia. We'll clinically observe at this time Dyslipidemia. We'll continue pravastatin

## 2011-05-17 ENCOUNTER — Ambulatory Visit: Payer: Self-pay | Admitting: Internal Medicine

## 2011-07-15 ENCOUNTER — Other Ambulatory Visit: Payer: Self-pay | Admitting: Internal Medicine

## 2011-07-15 DIAGNOSIS — I6529 Occlusion and stenosis of unspecified carotid artery: Secondary | ICD-10-CM

## 2011-07-18 ENCOUNTER — Encounter (INDEPENDENT_AMBULATORY_CARE_PROVIDER_SITE_OTHER): Payer: 59 | Admitting: *Deleted

## 2011-07-18 DIAGNOSIS — I6529 Occlusion and stenosis of unspecified carotid artery: Secondary | ICD-10-CM

## 2011-08-22 ENCOUNTER — Ambulatory Visit (INDEPENDENT_AMBULATORY_CARE_PROVIDER_SITE_OTHER): Payer: 59 | Admitting: Internal Medicine

## 2011-08-22 ENCOUNTER — Encounter: Payer: Self-pay | Admitting: Internal Medicine

## 2011-08-22 VITALS — BP 120/80 | HR 66 | Temp 98.3°F | Resp 16 | Ht 70.5 in | Wt 174.0 lb

## 2011-08-22 DIAGNOSIS — N400 Enlarged prostate without lower urinary tract symptoms: Secondary | ICD-10-CM

## 2011-08-22 DIAGNOSIS — Z0000 Encounter for general adult medical examination without abnormal findings: Secondary | ICD-10-CM

## 2011-08-22 DIAGNOSIS — Z860100 Personal history of colon polyps, unspecified: Secondary | ICD-10-CM

## 2011-08-22 DIAGNOSIS — K219 Gastro-esophageal reflux disease without esophagitis: Secondary | ICD-10-CM

## 2011-08-22 DIAGNOSIS — R0989 Other specified symptoms and signs involving the circulatory and respiratory systems: Secondary | ICD-10-CM

## 2011-08-22 DIAGNOSIS — I739 Peripheral vascular disease, unspecified: Secondary | ICD-10-CM

## 2011-08-22 LAB — COMPREHENSIVE METABOLIC PANEL
ALT: 13 U/L (ref 0–53)
AST: 23 U/L (ref 0–37)
Albumin: 3.7 g/dL (ref 3.5–5.2)
Alkaline Phosphatase: 72 U/L (ref 39–117)
BUN: 16 mg/dL (ref 6–23)
CO2: 29 mEq/L (ref 19–32)
Calcium: 9.1 mg/dL (ref 8.4–10.5)
Chloride: 104 mEq/L (ref 96–112)
Creatinine, Ser: 1 mg/dL (ref 0.4–1.5)
GFR: 88.73 mL/min (ref >60.00)
Glucose, Bld: 89 mg/dL (ref 70–99)
Potassium: 4.1 mEq/L (ref 3.5–5.1)
Sodium: 141 mEq/L (ref 135–145)
Total Bilirubin: 0.6 mg/dL (ref 0.3–1.2)
Total Protein: 7.4 g/dL (ref 6.0–8.3)

## 2011-08-22 LAB — CBC WITH DIFFERENTIAL/PLATELET
Basophils Absolute: 0 10*3/uL (ref 0.0–0.1)
Basophils Relative: 0.5 % (ref 0.0–3.0)
Eosinophils Absolute: 0.1 10*3/uL (ref 0.0–0.7)
Eosinophils Relative: 1.2 % (ref 0.0–5.0)
HCT: 39.7 % (ref 39.0–52.0)
Hemoglobin: 13.2 g/dL (ref 13.0–17.0)
Lymphocytes Relative: 25.5 % (ref 12.0–46.0)
Lymphs Abs: 1.3 10*3/uL (ref 0.7–4.0)
MCHC: 33.3 g/dL (ref 30.0–36.0)
MCV: 89.5 fl (ref 78.0–100.0)
Monocytes Absolute: 0.5 10*3/uL (ref 0.1–1.0)
Monocytes Relative: 10.6 % (ref 3.0–12.0)
Neutro Abs: 3.1 10*3/uL (ref 1.4–7.7)
Neutrophils Relative %: 62.2 % (ref 43.0–77.0)
Platelets: 211 10*3/uL (ref 150.0–400.0)
RBC: 4.44 Mil/uL (ref 4.22–5.81)
RDW: 13.9 % (ref 11.5–14.6)
WBC: 4.9 10*3/uL (ref 4.5–10.5)

## 2011-08-22 LAB — LIPID PANEL
Cholesterol: 183 mg/dL (ref 0–200)
HDL: 81.3 mg/dL (ref >39.00)
LDL Cholesterol: 93 mg/dL (ref 0–99)
Total CHOL/HDL Ratio: 2
Triglycerides: 42 mg/dL (ref 0.0–149.0)
VLDL: 8.4 mg/dL (ref 0.0–40.0)

## 2011-08-22 LAB — TSH: TSH: 1.09 u[IU]/mL (ref 0.35–5.50)

## 2011-08-22 MED ORDER — TADALAFIL 20 MG PO TABS: 20.0000 mg | ORAL_TABLET | Freq: Every day | ORAL | Status: DC | PRN

## 2011-08-22 MED ORDER — TAMSULOSIN HCL 0.4 MG PO CAPS: 0.4000 mg | ORAL_CAPSULE | Freq: Every day | ORAL | Status: DC

## 2011-08-22 MED ORDER — NAPROXEN SODIUM 220 MG PO TABS: 220.0000 mg | ORAL_TABLET | Freq: Two times a day (BID) | ORAL | Status: DC | PRN

## 2011-08-22 MED ORDER — PRAVASTATIN SODIUM 20 MG PO TABS: 20.0000 mg | ORAL_TABLET | Freq: Every day | ORAL | Status: DC

## 2011-08-22 NOTE — Progress Notes (Signed)
Subjective:    Patient ID: Connor Neal, male    DOB: Dec 10, 1930, 77 y.o.   MRN: [id number]  HPI CC: cpx - doing well.   History of Present Illness:   48 ear-old patient who is seen today for a comprehensive evaluation. He is doing quite well and denies any focal neurological symptoms or cardiopulmonary complaints. He does have a history of colonic polyps BPH.  he has a history of diverticulosis and colonic polyps. His last colonoscopy was 4 years ago. Remains on alpha-blocker for BPH and remained stable  His only complaint is some mild left upper medial thigh pain from recent overuse. The muscle pain is improving daily  Here for Medicare AWV:   1. Risk factors based on Past M, S, F history: vascular risk factors include family history of coronary artery disease; he has a history of peripheral vascular occlusive disease  2. Physical Activities: remains quite active and physical sense; continues to work occasionally  3. Depression/mood: no history of depression or mood disorder  4. Hearing: no hearing deficits  5. ADL's: remains independent in all aspects of daily living  6. Fall Risk: low  7. Home Safety: no polyps identified  8. Height, weight, &visual acuity:height and weight stable. No difficulty with visual acuity  9. Counseling: heart healthy diet regular. Exercise encouraged  10. Labs ordered based on risk factors: laboratory profile will be reviewed, including lipid profile  11. Referral Coordination- and appropriate at this time. Will consider colonoscopy in one year  12. Care Plan- exercise heart healthy diet will be continued. His medical regimen will be unchanged. This includes an alpha blocker only for BPH  13. Cognitive Assessment- alert and oriented, with normal affect. No memory difficulties. Able to handle all executive functions without difficulty   Allergies (verified):  No Known Drug Allergies  Past History:  Past Medical History:  left lower quadrant pain    Benign prostatic hypertrophy  Colonic polyps, hx of  erectile dysfunction  urge incontinence  Diverticulosis, colon  complete right ICA occlusion   Past Surgical History:  Reviewed history from 08/14/2009 and no changes required.  circumcision at age 65  status post transurethral destruction of the prostate by radiofrequency thermotherapy  colonoscopy December 2007   Family History:  Reviewed history from 08/14/2009 and no changes required.  father died age 40  mother died age 38 MI  Four brothers and 9 sisters; positive for coronary artery disease, and possible stomach cancer;  2 B, 3 S deceased   Social History:  Reviewed history from 08/14/2008 and no changes required.  Married  Regular exercise-yes    Review of Systems  Constitutional: Negative for fever, chills, activity change, appetite change and fatigue.  HENT: Negative for hearing loss, ear pain, congestion, rhinorrhea, sneezing, mouth sores, trouble swallowing, neck pain, neck stiffness, dental problem, voice change, sinus pressure and tinnitus.   Eyes: Negative for photophobia, pain, redness and visual disturbance.  Respiratory: Negative for apnea, cough, choking, chest tightness, shortness of breath and wheezing.   Cardiovascular: Negative for chest pain, palpitations and leg swelling.       No claudication  Gastrointestinal: Negative for nausea, vomiting, abdominal pain, diarrhea, constipation, blood in stool, abdominal distention, anal bleeding and rectal pain.  Genitourinary: Negative for dysuria, urgency, frequency, hematuria, flank pain, decreased urine volume, discharge, penile swelling, scrotal swelling, difficulty urinating, genital sores and testicular pain.  Musculoskeletal: Negative for myalgias, back pain, joint swelling, arthralgias and gait problem.  Skin: Negative for color change,  rash and wound.  Neurological: Negative for dizziness, tremors, seizures, syncope, facial asymmetry, speech difficulty,  weakness, light-headedness, numbness and headaches.  Hematological: Negative for adenopathy. Does not bruise/bleed easily.  Psychiatric/Behavioral: Negative for suicidal ideas, hallucinations, behavioral problems, confusion, sleep disturbance, self-injury, dysphoric mood, decreased concentration and agitation. The patient is not nervous/anxious.        Objective:   Physical Exam  Constitutional: He appears well-developed and well-nourished.  HENT:  Head: Normocephalic and atraumatic.  Right Ear: External ear normal.  Left Ear: External ear normal.  Nose: Nose normal.  Mouth/Throat: Oropharynx is clear and moist.  Eyes: Conjunctivae and EOM are normal. Pupils are equal, round, and reactive to light. No scleral icterus.  Neck: Normal range of motion. Neck supple. No JVD present. No thyromegaly present.  Cardiovascular: Regular rhythm and normal heart sounds.  Exam reveals no gallop and no friction rub.   No murmur heard.      Pedal pulses absent  Pulmonary/Chest: Effort normal and breath sounds normal. He exhibits no tenderness.  Abdominal: Soft. Bowel sounds are normal. He exhibits no distension and no mass. There is no tenderness.  Genitourinary: Penis normal. Guaiac negative stool. No penile tenderness.       +2 enlarged  Musculoskeletal: Normal range of motion. He exhibits no edema and no tenderness.  Lymphadenopathy:    He has no cervical adenopathy.  Neurological: He is alert. He has normal reflexes. No cranial nerve deficit. Coordination normal.  Skin: Skin is warm and dry. No rash noted.  Psychiatric: He has a normal mood and affect. His behavior is normal.          Assessment & Plan:   Preventive health examination PAD-  remained stable without claudication or focal neurological symptoms Colonic polyps BPH ED Statin therapy  Laboratory data including lipid profile will be reviewed  return in one year or as needed

## 2011-08-22 NOTE — Patient Instructions (Signed)
Take 81 mg of aspirin daily    It is important that you exercise regularly, at least 20 minutes 3 to 4 times per week.  If you develop chest pain or shortness of breath seek  medical attention.  Return in one year for follow-up

## 2011-08-23 ENCOUNTER — Telehealth: Payer: Self-pay | Admitting: Internal Medicine

## 2011-08-23 MED ORDER — VARDENAFIL HCL 20 MG PO TABS: 20.0000 mg | ORAL_TABLET | ORAL | Status: DC | PRN

## 2011-08-23 NOTE — Telephone Encounter (Signed)
Patient saw dr Kirtland Bouchard yesterday and Cialis was sent to [facility]. He would like another rx for Levitra sent to [facility] [address] in [place], because it is cheaper. Thanks.

## 2011-08-23 NOTE — Telephone Encounter (Signed)
levitra 20  # 6  RF 6

## 2011-08-23 NOTE — Telephone Encounter (Signed)
done

## 2011-08-23 NOTE — Telephone Encounter (Signed)
Please advise on alternate rx to be called in

## 2011-09-06 ENCOUNTER — Telehealth: Payer: Self-pay | Admitting: *Deleted

## 2011-09-06 NOTE — Telephone Encounter (Signed)
Pt needs results of last PSA, and dates, please.  Please call.  I do not see one.

## 2011-09-07 NOTE — Telephone Encounter (Signed)
Spoke with wife - informed that I dont see that psa was ordered or drawn on 10/15 appt. - he has urology appt . Will f/u there

## 2012-02-18 ENCOUNTER — Ambulatory Visit (INDEPENDENT_AMBULATORY_CARE_PROVIDER_SITE_OTHER): Payer: Medicare Other | Admitting: Family Medicine

## 2012-02-18 ENCOUNTER — Encounter: Payer: Self-pay | Admitting: Family Medicine

## 2012-02-18 ENCOUNTER — Ambulatory Visit: Payer: Medicare Other | Admitting: Family Medicine

## 2012-02-18 DIAGNOSIS — R079 Chest pain, unspecified: Secondary | ICD-10-CM

## 2012-02-18 DIAGNOSIS — S46819A Strain of other muscles, fascia and tendons at shoulder and upper arm level, unspecified arm, initial encounter: Secondary | ICD-10-CM

## 2012-02-18 MED ORDER — CYCLOBENZAPRINE HCL 5 MG PO TABS: 5.0000 mg | ORAL_TABLET | Freq: Every evening | ORAL | Status: AC | PRN

## 2012-02-18 MED ORDER — DICLOFENAC SODIUM 75 MG PO TBEC: 75.0000 mg | DELAYED_RELEASE_TABLET | Freq: Two times a day (BID) | ORAL | Status: DC

## 2012-02-18 NOTE — Progress Notes (Signed)
  Patient Name: Connor Neal Date of Birth: [birth year] Age: 78 y.o. Medical Record Number: [id number] Gender: male Date of Encounter: 02/18/2012  History of Present Illness:  Vihan Santagata is a 78 y.o. very pleasant male patient who presents with the following:  Did some yardwork about three weeks ago -- and muscle is sticking in his back. On his left side of his chest and also in his left posterior of his chest. Then it will come back again, and he will have to holler out. No particular pain, numbness, or focal weakness. No shoulder pain. No pain with abduction. No pain with internal rotation.  No SOB with walking or going up and down the stairs, walked 2 miles on Thurs.  Remote h/o tobacco  At home, has tried some prayer, but that is about it. No meds or other non-pharm treatments  Past Medical History, Surgical History, Social History, Family History, Problem List, Medications, and Allergies have been reviewed and updated if relevant.  Review of Systems:  GEN: No fevers, chills. Nontoxic. Primarily MSK c/o today. MSK: Detailed in the HPI GI: tolerating PO intake without difficulty Neuro: No numbness, parasthesias, or tingling associated. Otherwise the pertinent positives of the ROS are noted above.    Physical Examination: Filed Vitals:   02/18/12 1043  BP: 150/80  Pulse: 67  Temp: 97.6 F (36.4 C)  TempSrc: Oral  Weight: 174 lb (78.926 kg)  SpO2: 97%    There is no height on file to calculate BMI.   GEN: WDWN, NAD, Non-toxic, A & O x 3 HEENT: Atraumatic, Normocephalic. Neck supple. No masses, No LAD. Ears and Nose: No external deformity. CV: RRR, No M/G/R. No JVD. No thrill. No extra heart sounds. PULM: CTA B, no wheezes, crackles, rhonchi. No retractions. No resp. distress. No accessory muscle use. MSK: full range of motion of the shoulder without any difficulties. Strength is 5/5 throughout. Nontender at the a.c. Joint. Nontender with crossover. Nontender with  KENNEDY test. Nontender near test. Negative speed's test. Negative Yergason's test. Negative Jobe test Focally tender more at the upper aspect of the trapezius on the LEFT as well as the insertion of the pectoralis major EXTR: No c/c/e NEURO Normal gait.  PSYCH: Normally interactive. Conversant. Not depressed or anxious appearing.  Calm demeanor.    Assessment and Plan:  1. Chest pain  EKG 12-Lead  2. Trapezius strain     EKG: Normal sinus rhythm. Normal axis, normal R wave progression, No acute ST elevation or depression. Increased amplitude is in the anterior leads and the QRS complex, but otherwise grossly unremarkable electrocardiogram  Suspect musculoskeletal pec major insertional tendinopathy as well as trapezius strain. Reviewed basic rehabilitation maneuvers, start with some anti-inflammatories, muscle relaxant might time, heat, and massage  Orders Today: Orders Placed This Encounter  Procedures  . EKG 12-Lead    Medications Today: Meds ordered this encounter  Medications  . diclofenac (VOLTAREN) 75 MG EC tablet    Sig: Take 1 tablet (75 mg total) by mouth 2 (two) times daily.    Dispense:  40 tablet    Refill:  0  . cyclobenzaprine (FLEXERIL) 5 MG tablet    Sig: Take 1 tablet (5 mg total) by mouth at bedtime as needed for muscle spasms.    Dispense:  30 tablet    Refill:  1

## 2012-02-20 ENCOUNTER — Telehealth: Payer: Self-pay | Admitting: Family Medicine

## 2012-02-20 NOTE — Telephone Encounter (Signed)
[facility] Triage Call Report Triage Record Num: [id number] Operator: Aundra Millet Patient Name: Connor Neal Call Date & Time: 02/18/2012 7:42:57AM Patient Phone: [phone] PCP: Gordy Savers Patient Gender: Male PCP Fax : [phone] Patient DOB: 1931/09/29 Practice Name[facility] Reason for Call: Caller: [name]/Patient; PCP: Eleonore Chiquito; CB#: [phone]; Call regarding Back Pain; Upper back pain x 3 weeks. Pr recalls doing yardwork using tiller ~ 1 week before sx's started but no known injury. Hasnt tried OTC Tylenol nor Advil. Pt also c/o of episodes of pain on left chest that radiates to back interm for ~ 3 weeks as well - Sx's lasting up to 1 minute with 3 episodes this morning. No trouble breathing. RN adv to hang up and 911 for cardiac sx's per Back Symptoms protocol and pt stated " I dont need to dial 911" and wants to come to office. RN adv again the recommendation is to hang up and dial 911 and pt declined. Appt sched for 1030 with Dr Patsy Lager this morning at [facility] office per pt request Protocol(s) Used: Back Symptoms Recommended Outcome per Protocol: Activate EMS 911 Reason for Outcome: Any other cardiac signs/symptoms for more than 5 minutes, now or within last hour. Pain is NOT associated with taking a deep breath or a productive cough, movement, or touch to a localized area on the chest or upper body. Care Advice: ~ An adult should stay with the patient, preferably one trained in CPR. ~ IMMEDIATE ACTION Write down provider's name. List or place the following in a bag for transport with the patient: current prescription and/or nonprescription medications; alternative treatments, therapies and medications; and street drugs. ~ 02/18/2012 8:23:35AM Page 1 of 1 [email or web address]

## 2012-04-25 ENCOUNTER — Ambulatory Visit: Payer: Medicare Other | Admitting: Pediatrics

## 2012-06-18 ENCOUNTER — Telehealth: Payer: Self-pay | Admitting: Family Medicine

## 2012-06-18 NOTE — Telephone Encounter (Signed)
Patient is requesting referral from Dr. Kirtland Bouchard for his colonoscopy. Looks like, per EMR, last one was 10/27/2006. [place], under appointment screen in EPIC, looks like GI set him up for Recall in 2017, so that may need to be looked into. Thanks!

## 2012-06-18 NOTE — Telephone Encounter (Signed)
Spoke with pt- he thinks he needs one every 5 yrs - states he has the occasional abd pain . I tried to explain that according to GI - last done 2007 repeat in 41yrs.  He is aware dr. Amador Cunas out of office and I will have to wait for him to ok referral .

## 2012-06-24 NOTE — Telephone Encounter (Signed)
We'll reassess need at  the time of his annual exam in the fall

## 2012-06-25 NOTE — Telephone Encounter (Signed)
Pt aware.

## 2012-08-06 ENCOUNTER — Encounter: Payer: Self-pay | Admitting: Gastroenterology

## 2012-08-22 ENCOUNTER — Encounter: Payer: 59 | Admitting: Internal Medicine

## 2012-08-22 ENCOUNTER — Encounter: Payer: Self-pay | Admitting: Internal Medicine

## 2012-08-22 ENCOUNTER — Ambulatory Visit (INDEPENDENT_AMBULATORY_CARE_PROVIDER_SITE_OTHER): Payer: Medicare Other | Admitting: Internal Medicine

## 2012-08-22 VITALS — BP 138/80 | HR 60 | Temp 98.2°F | Resp 18 | Ht 70.4 in | Wt 182.0 lb

## 2012-08-22 DIAGNOSIS — K219 Gastro-esophageal reflux disease without esophagitis: Secondary | ICD-10-CM

## 2012-08-22 DIAGNOSIS — Z0000 Encounter for general adult medical examination without abnormal findings: Secondary | ICD-10-CM

## 2012-08-22 DIAGNOSIS — I739 Peripheral vascular disease, unspecified: Secondary | ICD-10-CM

## 2012-08-22 DIAGNOSIS — E785 Hyperlipidemia, unspecified: Secondary | ICD-10-CM | POA: Insufficient documentation

## 2012-08-22 LAB — CBC WITH DIFFERENTIAL/PLATELET
Basophils Absolute: 0 10*3/uL (ref 0.0–0.1)
HCT: 41.5 % (ref 39.0–52.0)
Hemoglobin: 13.2 g/dL (ref 13.0–17.0)
Lymphs Abs: 1.6 10*3/uL (ref 0.7–4.0)
MCHC: 31.9 g/dL (ref 30.0–36.0)
MCV: 89.4 fl (ref 78.0–100.0)
Monocytes Absolute: 0.6 10*3/uL (ref 0.1–1.0)
Neutro Abs: 3 10*3/uL (ref 1.4–7.7)
Platelets: 192 10*3/uL (ref 150.0–400.0)
RDW: 14.4 % (ref 11.5–14.6)

## 2012-08-22 LAB — LIPID PANEL
LDL Cholesterol: 116 mg/dL — ABNORMAL HIGH (ref 0–99)
Total CHOL/HDL Ratio: 3

## 2012-08-22 LAB — COMPREHENSIVE METABOLIC PANEL
ALT: 12 U/L (ref 0–53)
AST: 22 U/L (ref 0–37)
Alkaline Phosphatase: 78 U/L (ref 39–117)
CO2: 29 mEq/L (ref 19–32)
Creatinine, Ser: 1.1 mg/dL (ref 0.4–1.5)
Sodium: 141 mEq/L (ref 135–145)
Total Bilirubin: 0.7 mg/dL (ref 0.3–1.2)
Total Protein: 6.9 g/dL (ref 6.0–8.3)

## 2012-08-22 MED ORDER — PRAVASTATIN SODIUM 20 MG PO TABS: 20.0000 mg | ORAL_TABLET | Freq: Every day | ORAL | Status: DC

## 2012-08-22 MED ORDER — VARDENAFIL HCL 20 MG PO TABS: 20.0000 mg | ORAL_TABLET | ORAL | Status: DC | PRN

## 2012-08-22 MED ORDER — DICLOFENAC SODIUM 75 MG PO TBEC: 75.0000 mg | DELAYED_RELEASE_TABLET | Freq: Two times a day (BID) | ORAL | Status: AC

## 2012-08-22 MED ORDER — TAMSULOSIN HCL 0.4 MG PO CAPS: 0.4000 mg | ORAL_CAPSULE | Freq: Every day | ORAL | Status: DC

## 2012-08-22 NOTE — Patient Instructions (Addendum)
Limit your sodium (Salt) intake  Carotid artery duplex study as discussed  Stress test as discussed    It is important that you exercise regularly, at least 20 minutes 3 to 4 times per week.  If you develop chest pain or shortness of breath seek  medical attention.

## 2012-08-22 NOTE — Progress Notes (Signed)
Patient ID: Connor Neal, male   DOB: Jul 15, 1931, 78 y.o.   MRN: [id number]  Subjective:    Patient ID: Connor Neal, male    DOB: Aug 12, 1931, 78 y.o.   MRN: [id number]  Hyperlipidemia Associated symptoms include chest pain. Pertinent negatives include no myalgias or shortness of breath.   CC: cpx - doing well.   History of Present Illness:   25 ear-old patient who is seen today for a comprehensive evaluation.  Patient has a history of peripheral vascular disease with a totally occluded right ICA. Denies any focal neurological symptoms. He also has absent pedal pulses but denies any claudication. He walks 1 mile approximately 3 times per week and denies any exertional chest pain or claudication. He does state that he has occasional chest tightness that occurs 2-3 times per week paroxysmally.  He does have a history of GERD and does take diclofenac occasionally. Remains on pravastatin for dyslipidemia. He does have a history of colonic polyps and his last colonoscopy was approximately 5 years ago he wishes to defer additional screening and consider colonoscopy for diagnostic purposes only  Here for Medicare AWV:   1. Risk factors based on Past M, S, F history: vascular risk factors include family history of coronary artery disease; he has a history of peripheral vascular occlusive disease and dyslipidemia 2. Physical Activities: remains quite active and physical sense; continues to work occasionally  3. Depression/mood: no history of depression or mood disorder  4. Hearing: no hearing deficits  5. ADL's: remains independent in all aspects of daily living  6. Fall Risk: low  7. Home Safety: no polyps identified  8. Height, weight, &visual acuity:height and weight stable. No difficulty with visual acuity  9. Counseling: heart healthy diet regular. Exercise encouraged  10. Labs ordered based on risk factors: laboratory profile will be reviewed, including lipid profile  11. Referral  Coordination-  needs followup carotid artery Doppler study  12. Care Plan- exercise heart healthy diet will be continued. His medical regimen will be unchanged. This includes an alpha blocker only for BPH  13. Cognitive Assessment- alert and oriented, with normal affect. No memory difficulties. Able to handle all executive functions without difficulty   Allergies (verified):  No Known Drug Allergies  Past History:  Past Medical History:  left lower quadrant pain  Benign prostatic hypertrophy  Colonic polyps, hx of  erectile dysfunction  urge incontinence  Diverticulosis, colon  complete right ICA occlusion   Past Surgical History:  Reviewed history from 08/14/2009 and no changes required.  circumcision at age 10  status post transurethral destruction of the prostate by radiofrequency thermotherapy  colonoscopy December 2007   Family History:  Reviewed history from 08/14/2009 and no changes required.  father died age 21  mother died age 35 MI  Four brothers and 9 sisters; positive for coronary artery disease, and possible stomach cancer;  2 B, 3 S deceased   Social History:  Reviewed history from 08/14/2008 and no changes required.  Married  Regular exercise-yes  Past Medical History  Diagnosis Date  . ABDOMINAL PAIN 11/14/2008  . BENIGN PROSTATIC HYPERTROPHY 02/13/2008  . COLONIC POLYPS, HX OF 08/14/2008  . COLONIC POLYPS 01/04/2008  . Diverticulosis of colon (without mention of hemorrhage) 01/04/2008  . GERD 11/17/2010  . Internal hemorrhoids with other complication 01/04/2008  . NECK PAIN 06/30/2010  . Other symptoms involving cardiovascular system 07/01/2010  . PROSTATITIS, RECURRENT 01/04/2008  . PVD 08/17/2010  . TOBACCO USE, QUIT 08/14/2009  History   Social History  . Marital Status: Married    Spouse Name: N/A    Number of Children: N/A  . Years of Education: N/A   Occupational History  . Not on file.   Social History Main Topics  . Smoking status: Former  Smoker    Quit date: 11/07/1958  . Smokeless tobacco: Never Used  . Alcohol Use: No  . Drug Use: No  . Sexually Active: Not on file   Other Topics Concern  . Not on file   Social History Narrative  . No narrative on file    Past Surgical History  Procedure Date  . Transurethral resection of prostate     No family history on file.  No Known Allergies  Current Outpatient Prescriptions on File Prior to Visit  Medication Sig Dispense Refill  . aspirin 81 MG tablet Take 81 mg by mouth daily.        . diclofenac (VOLTAREN) 75 MG EC tablet Take 1 tablet (75 mg total) by mouth 2 (two) times daily.  40 tablet  0  . naproxen sodium (ANAPROX) 220 MG tablet Take 1 tablet (220 mg total) by mouth 2 (two) times daily as needed.  180 tablet  6  . pravastatin (PRAVACHOL) 20 MG tablet Take 1 tablet (20 mg total) by mouth daily.  90 tablet  6  . Tamsulosin HCl (FLOMAX) 0.4 MG CAPS Take 1 capsule (0.4 mg total) by mouth daily.  90 capsule  6  . vardenafil (LEVITRA) 20 MG tablet Take 1 tablet (20 mg total) by mouth as needed for erectile dysfunction.  6 tablet  6    BP 138/80  Pulse 60  Temp 98.2 F (36.8 C) (Oral)  Resp 18  Ht 5' 10.4" (1.788 m)  Wt 182 lb (82.555 kg)  BMI 25.82 kg/m2  SpO2 98%     Review of Systems  Constitutional: Negative for fever, chills, activity change, appetite change and fatigue.  HENT: Negative for hearing loss, ear pain, congestion, rhinorrhea, sneezing, mouth sores, trouble swallowing, neck pain, neck stiffness, dental problem, voice change, sinus pressure and tinnitus.   Eyes: Negative for photophobia, pain, redness and visual disturbance.  Respiratory: Negative for apnea, cough, choking, chest tightness, shortness of breath and wheezing.   Cardiovascular: Positive for chest pain. Negative for palpitations and leg swelling.       No claudication  Gastrointestinal: Negative for nausea, vomiting, abdominal pain, diarrhea, constipation, blood in stool,  abdominal distention, anal bleeding and rectal pain.  Genitourinary: Negative for dysuria, urgency, frequency, hematuria, flank pain, decreased urine volume, discharge, penile swelling, scrotal swelling, difficulty urinating, genital sores and testicular pain.  Musculoskeletal: Negative for myalgias, back pain, joint swelling, arthralgias and gait problem.  Skin: Negative for color change, rash and wound.  Neurological: Negative for dizziness, tremors, seizures, syncope, facial asymmetry, speech difficulty, weakness, light-headedness, numbness and headaches.  Hematological: Negative for adenopathy. Does not bruise/bleed easily.  Psychiatric/Behavioral: Negative for suicidal ideas, hallucinations, behavioral problems, confusion, disturbed wake/sleep cycle, self-injury, dysphoric mood, decreased concentration and agitation. The patient is not nervous/anxious.        Objective:   Physical Exam  Constitutional: He appears well-developed and well-nourished.  HENT:  Head: Normocephalic and atraumatic.  Right Ear: External ear normal.  Left Ear: External ear normal.  Nose: Nose normal.  Mouth/Throat: Oropharynx is clear and moist.  Eyes: Conjunctivae normal and EOM are normal. Pupils are equal, round, and reactive to light. No scleral icterus.  Neck: Normal  range of motion. Neck supple. No JVD present. No thyromegaly present.       Decreased right carotid upstroke. No bruits  Cardiovascular: Regular rhythm and normal heart sounds.  Exam reveals no gallop and no friction rub.   No murmur heard.      Pedal pulses absent  Pulmonary/Chest: Effort normal and breath sounds normal. He exhibits no tenderness.  Abdominal: Soft. Bowel sounds are normal. He exhibits no distension and no mass. There is no tenderness.  Genitourinary: Penis normal. Guaiac negative stool. No penile tenderness.       +2 enlarged  Musculoskeletal: Normal range of motion. He exhibits no edema and no tenderness.    Lymphadenopathy:    He has no cervical adenopathy.  Neurological: He is alert. He has normal reflexes. No cranial nerve deficit. Coordination normal.  Skin: Skin is warm and dry. No rash noted.  Psychiatric: He has a normal mood and affect. His behavior is normal.          Assessment & Plan:   Preventive health examination PAD-  remained stable without claudication or focal neurological symptoms Colonic polyps BPH ED Statin therapy  Laboratory data including lipid profile will be reviewed  return in one year or as needed Follow carotid artery Doppler study

## 2012-08-24 ENCOUNTER — Encounter (INDEPENDENT_AMBULATORY_CARE_PROVIDER_SITE_OTHER): Payer: Medicare Other

## 2012-08-24 DIAGNOSIS — I6529 Occlusion and stenosis of unspecified carotid artery: Secondary | ICD-10-CM

## 2012-08-24 DIAGNOSIS — I739 Peripheral vascular disease, unspecified: Secondary | ICD-10-CM

## 2012-08-30 ENCOUNTER — Ambulatory Visit (HOSPITAL_COMMUNITY): Payer: Medicare Other | Attending: Internal Medicine | Admitting: Radiology

## 2012-08-30 VITALS — BP 142/78 | Ht 70.5 in | Wt 182.0 lb

## 2012-08-30 DIAGNOSIS — R0789 Other chest pain: Secondary | ICD-10-CM | POA: Insufficient documentation

## 2012-08-30 DIAGNOSIS — R079 Chest pain, unspecified: Secondary | ICD-10-CM

## 2012-08-30 DIAGNOSIS — I739 Peripheral vascular disease, unspecified: Secondary | ICD-10-CM | POA: Insufficient documentation

## 2012-08-30 DIAGNOSIS — Z8249 Family history of ischemic heart disease and other diseases of the circulatory system: Secondary | ICD-10-CM | POA: Insufficient documentation

## 2012-08-30 MED ORDER — TECHNETIUM TC 99M SESTAMIBI GENERIC - CARDIOLITE
11.0000 | Freq: Once | INTRAVENOUS | Status: AC | PRN
  Administered 2012-08-30: 11 via INTRAVENOUS

## 2012-08-30 MED ORDER — TECHNETIUM TC 99M SESTAMIBI GENERIC - CARDIOLITE
33.0000 | Freq: Once | INTRAVENOUS | Status: AC | PRN
  Administered 2012-08-30: 33 via INTRAVENOUS

## 2012-08-30 MED ORDER — REGADENOSON 0.4 MG/5ML IV SOLN
0.4000 mg | Freq: Once | INTRAVENOUS | Status: AC
  Administered 2012-08-30: 0.4 mg via INTRAVENOUS

## 2012-08-30 NOTE — Progress Notes (Signed)
[facility] SITE 3 NUCLEAR MED [id number] [place] [address] [phone]  Cardiology Nuclear Med Study  Connor Neal is a 78 y.o. male     MRN : [id number]     DOB: 1930-12-13  Procedure Date: 08/30/2012  Nuclear Med Background Indication for Stress Test:  Evaluation for Ischemia History:  No prior known history of CAD, '07 Myocardial Perfusion Study-Normal, EF=66% Cardiac Risk Factors: Carotid Disease, Family History - CAD, History of Smoking, Lipids and PVD  Symptoms:  Chest Tightness (last date of chest discomfort yesterday)   Nuclear Pre-Procedure Caffeine/Decaff Intake:  None NPO After: 5pm   Lungs:  clear O2 Sat: 98% on room air. IV 0.9% NS with Angio Cath:  22g  IV Site: R Hand  IV Started by:  Bonnita Levan, RN  Chest Size (in):  44 Cup Size: n/a  Height: 5' 10.5" (1.791 m)  Weight:  182 lb (82.555 kg)  BMI:  Body mass index is 25.75 kg/(m^2). Tech Comments:  N/A    Nuclear Med Study 1 or 2 day study: 1 day  Stress Test Type:  Lexiscan  Reading MD: Kristeen Miss, MD  Order Authorizing Provider:  Eleonore Chiquito, MD  Resting Radionuclide: Technetium 51m Sestamibi  Resting Radionuclide Dose: 11.0 mCi   Stress Radionuclide:  Technetium 31m Sestamibi  Stress Radionuclide Dose: 33.0 mCi           Stress Protocol Rest HR: 51 Stress HR: 88  Rest BP: 142/78 Stress BP: 153/88  Exercise Time (min): n/a METS: n/a   Predicted Max HR: 141 bpm % Max HR: 62.41 bpm Rate Pressure Product: 62952   Dose of Adenosine (mg):  n/a Dose of Lexiscan: 0.4 mg  Dose of Atropine (mg): n/a Dose of Dobutamine: n/a mcg/kg/min (at max HR)  Stress Test Technologist: Irean Hong, RN  Nuclear Technologist:  Domenic Polite, CNMT     Rest Procedure:  Myocardial perfusion imaging was performed at rest 45 minutes following the intravenous administration of Technetium 37m Sestamibi. Rest ECG: Sinus Bradycardia, 1st Degree AVB, poor R wave progression    Stress Procedure:  The patient received IV Lexiscan 0.4 mg over 15-seconds.  Technetium 35m Sestamibi injected at 30-seconds.  There were no significant changes with Lexiscan.  There was a rare PAC, PVC. Quantitative spect images were obtained after a 45 minute delay. Stress ECG: No significant change from baseline ECG  QPS Raw Data Images:  Normal; no motion artifact; normal heart/lung ratio. Stress Images:  Normal homogeneous uptake in all areas of the myocardium. Rest Images:  Normal homogeneous uptake in all areas of the myocardium. Subtraction (SDS):  No evidence of ischemia. Transient Ischemic Dilatation (Normal <1.22):  1.06 Lung/Heart Ratio (Normal <0.45):  0.24  Quantitative Gated Spect Images QGS EDV:  105 ml QGS ESV:  40 ml  Impression Exercise Capacity:  Lexiscan with no exercise. BP Response:  Normal blood pressure response. Clinical Symptoms:  No significant symptoms noted. ECG Impression:  No significant ST segment change suggestive of ischemia. Comparison with Prior Nuclear Study: No images to compare  Overall Impression:  Normal stress nuclear study.  No evidence of ischemia.  Normal LV function.    LV Ejection Fraction: 62%.  LV Wall Motion:  NL LV Function; NL Wall Motion.    Vesta Mixer, Montez Hageman., MD, [facility] 08/30/2012, 6:25 PM Office - [phone] Pager [phone]

## 2012-09-03 NOTE — Progress Notes (Signed)
Quick Note:  Left msg. Of normal results on answering machine ______

## 2012-09-21 ENCOUNTER — Other Ambulatory Visit: Payer: Self-pay | Admitting: Internal Medicine

## 2012-09-24 NOTE — Telephone Encounter (Signed)
Med filled.  

## 2013-12-18 ENCOUNTER — Ambulatory Visit (INDEPENDENT_AMBULATORY_CARE_PROVIDER_SITE_OTHER): Payer: Medicare Other | Admitting: Internal Medicine

## 2013-12-18 ENCOUNTER — Encounter: Payer: Self-pay | Admitting: Internal Medicine

## 2013-12-18 VITALS — BP 140/80

## 2013-12-18 DIAGNOSIS — Z860100 Personal history of colon polyps, unspecified: Secondary | ICD-10-CM

## 2013-12-18 DIAGNOSIS — E785 Hyperlipidemia, unspecified: Secondary | ICD-10-CM

## 2013-12-18 DIAGNOSIS — K5730 Diverticulosis of large intestine without perforation or abscess without bleeding: Secondary | ICD-10-CM

## 2013-12-18 DIAGNOSIS — K219 Gastro-esophageal reflux disease without esophagitis: Secondary | ICD-10-CM

## 2013-12-18 DIAGNOSIS — Z0000 Encounter for general adult medical examination without abnormal findings: Secondary | ICD-10-CM

## 2013-12-18 LAB — CBC WITH DIFFERENTIAL/PLATELET
BASOS ABS: 0 10*3/uL (ref 0.0–0.1)
Basophils Relative: 0.4 % (ref 0.0–3.0)
EOS ABS: 0.1 10*3/uL (ref 0.0–0.7)
Eosinophils Relative: 1.4 % (ref 0.0–5.0)
HCT: 40.9 % (ref 39.0–52.0)
Hemoglobin: 13.1 g/dL (ref 13.0–17.0)
Lymphocytes Relative: 29.8 % (ref 12.0–46.0)
Lymphs Abs: 1.5 10*3/uL (ref 0.7–4.0)
MCHC: 32 g/dL (ref 30.0–36.0)
MCV: 89.8 fl (ref 78.0–100.0)
Monocytes Absolute: 0.5 10*3/uL (ref 0.1–1.0)
Monocytes Relative: 10.8 % (ref 3.0–12.0)
NEUTROS PCT: 57.6 % (ref 43.0–77.0)
Neutro Abs: 2.8 10*3/uL (ref 1.4–7.7)
PLATELETS: 221 10*3/uL (ref 150.0–400.0)
RBC: 4.56 Mil/uL (ref 4.22–5.81)
RDW: 14.3 % (ref 11.5–14.6)
WBC: 4.9 10*3/uL (ref 4.5–10.5)

## 2013-12-18 LAB — TSH: TSH: 2.02 u[IU]/mL (ref 0.35–5.50)

## 2013-12-18 LAB — HEPATIC FUNCTION PANEL
ALT: 12 U/L (ref 0–53)
AST: 21 U/L (ref 0–37)
Albumin: 3.7 g/dL (ref 3.5–5.2)
Alkaline Phosphatase: 79 U/L (ref 39–117)
BILIRUBIN TOTAL: 0.8 mg/dL (ref 0.3–1.2)
Bilirubin, Direct: 0.1 mg/dL (ref 0.0–0.3)
Total Protein: 7.1 g/dL (ref 6.0–8.3)

## 2013-12-18 LAB — LIPID PANEL
CHOLESTEROL: 197 mg/dL (ref 0–200)
HDL: 70 mg/dL (ref >39.00)
LDL Cholesterol: 116 mg/dL — ABNORMAL HIGH (ref 0–99)
Total CHOL/HDL Ratio: 3
Triglycerides: 55 mg/dL (ref 0.0–149.0)
VLDL: 11 mg/dL (ref 0.0–40.0)

## 2013-12-18 LAB — POCT URINALYSIS DIPSTICK
Bilirubin, UA: NEGATIVE
Blood, UA: NEGATIVE
Glucose, UA: NEGATIVE
KETONES UA: NEGATIVE
Leukocytes, UA: NEGATIVE
Nitrite, UA: NEGATIVE
PH UA: 5.5
Spec Grav, UA: 1.025
Urobilinogen, UA: 0.2

## 2013-12-18 LAB — BASIC METABOLIC PANEL
BUN: 19 mg/dL (ref 6–23)
CO2: 28 mEq/L (ref 19–32)
CREATININE: 1.1 mg/dL (ref 0.4–1.5)
Calcium: 9.3 mg/dL (ref 8.4–10.5)
Chloride: 107 mEq/L (ref 96–112)
GFR: 80.15 mL/min (ref >60.00)
Glucose, Bld: 95 mg/dL (ref 70–99)
POTASSIUM: 4 meq/L (ref 3.5–5.1)
Sodium: 142 mEq/L (ref 135–145)

## 2013-12-18 NOTE — Progress Notes (Signed)
Pre-visit discussion using our clinic review tool. No additional management support is needed unless otherwise documented below in the visit note.  

## 2013-12-18 NOTE — Patient Instructions (Signed)
Limit your sodium (Salt) intake  Please check your blood pressure on a regular basis.  If it is consistently greater than 150/90, please make an office appointment.  Return in one year for follow-up  Resume the aspirin as well as pravastatin

## 2013-12-18 NOTE — Progress Notes (Signed)
Patient ID: Connor Neal, male   DOB: 09/24/1931, 79 y.o.   MRN: [id number]  Subjective:    Patient ID: Connor Neal, male    DOB: 1931-08-06, 80 y.o.   MRN: [id number]  Hyperlipidemia Associated symptoms include chest pain. Pertinent negatives include no myalgias or shortness of breath.   CC: cpx - doing well.   History of Present Illness:   39  ear-old patient who is seen today for a comprehensive evaluation.  Patient has a history of peripheral vascular disease with a totally occluded right ICA. Denies any focal neurological symptoms. He also has absent pedal pulses but denies any claudication. He walks 1 mile approximately 3 times per week and denies any exertional chest pain or claudication.  He does have a history of GERD and does take diclofenac occasionally. He states that he has discontinued both pravastatin and daily aspirin  He does have a history of colonic polyps and his last colonoscopy was approximately 5 years ago he wishes to defer additional screening and consider colonoscopy for diagnostic purposes only  Here for Medicare AWV:   1. Risk factors based on Past M, S, F history: vascular risk factors include family history of coronary artery disease; he has a history of peripheral vascular occlusive disease and dyslipidemia 2. Physical Activities: remains quite active and physical sense; continues to work occasionally  3. Depression/mood: no history of depression or mood disorder  4. Hearing: no hearing deficits  5. ADL's: remains independent in all aspects of daily living  6. Fall Risk: low  7. Home Safety: no polyps identified  8. Height, weight, &visual acuity:height and weight stable. No difficulty with visual acuity  9. Counseling: heart healthy diet regular. Exercise encouraged  10. Labs ordered based on risk factors: laboratory profile will be reviewed, including lipid profile  11. Referral Coordination-  needs followup carotid artery Doppler study  12. Care Plan-  exercise heart healthy diet will be continued. His medical regimen will be unchanged. This includes an alpha blocker only for BPH  13. Cognitive Assessment- alert and oriented, with normal affect. No memory difficulties. Able to handle all executive functions without difficulty   Allergies (verified):  No Known Drug Allergies  Past History:  Past Medical History:  left lower quadrant pain  Benign prostatic hypertrophy  Colonic polyps, hx of  erectile dysfunction  urge incontinence  Diverticulosis, colon  complete right ICA occlusion   Past Surgical History:   circumcision at age 3  status post transurethral destruction of the prostate by radiofrequency thermotherapy  colonoscopy December 2007   Family History:    father died age 72  mother died age 48 MI  Four brothers and 9 sisters; positive for coronary artery disease, and possible stomach cancer;  2 B, 3 S deceased   Social History:   Married  Regular exercise-yes  Past Medical History  Diagnosis Date  . ABDOMINAL PAIN 11/14/2008  . BENIGN PROSTATIC HYPERTROPHY 02/13/2008  . COLONIC POLYPS, HX OF 08/14/2008  . COLONIC POLYPS 01/04/2008  . Diverticulosis of colon (without mention of hemorrhage) 01/04/2008  . GERD 11/17/2010  . Internal hemorrhoids with other complication 01/04/2008  . NECK PAIN 06/30/2010  . Other symptoms involving cardiovascular system 07/01/2010  . PROSTATITIS, RECURRENT 01/04/2008  . PVD 08/17/2010  . TOBACCO USE, QUIT 08/14/2009    History   Social History  . Marital Status: Married    Spouse Name: N/A    Number of Children: N/A  . Years of Education: N/A  Occupational History  . Not on file.   Social History Main Topics  . Smoking status: Former Smoker    Quit date: 11/07/1958  . Smokeless tobacco: Never Used  . Alcohol Use: No  . Drug Use: No  . Sexual Activity: Not on file   Other Topics Concern  . Not on file   Social History Narrative  . No narrative on file    Past Surgical  History  Procedure Laterality Date  . Transurethral resection of prostate      No family history on file.  No Known Allergies  Current Outpatient Prescriptions on File Prior to Visit  Medication Sig Dispense Refill  . pravastatin (PRAVACHOL) 20 MG tablet Take 1 tablet (20 mg total) by mouth daily.  90 tablet  6  . tadalafil (CIALIS) 20 MG tablet Take 1 tablet (20 mg total) by mouth daily as needed for erectile dysfunction.  10 tablet  6  . Tamsulosin HCl (FLOMAX) 0.4 MG CAPS Take 1 capsule (0.4 mg total) by mouth daily.  90 capsule  6   No current facility-administered medications on file prior to visit.    BP 140/80     Review of Systems  Constitutional: Negative for fever, chills, activity change, appetite change and fatigue.  HENT: Negative for congestion, dental problem, ear pain, hearing loss, mouth sores, rhinorrhea, sinus pressure, sneezing, tinnitus, trouble swallowing and voice change.   Eyes: Negative for photophobia, pain, redness and visual disturbance.  Respiratory: Negative for apnea, cough, choking, chest tightness, shortness of breath and wheezing.   Cardiovascular: Positive for chest pain. Negative for palpitations and leg swelling.       No claudication  Gastrointestinal: Negative for nausea, vomiting, abdominal pain, diarrhea, constipation, blood in stool, abdominal distention, anal bleeding and rectal pain.  Genitourinary: Negative for dysuria, urgency, frequency, hematuria, flank pain, decreased urine volume, discharge, penile swelling, scrotal swelling, difficulty urinating, genital sores and testicular pain.  Musculoskeletal: Negative for arthralgias, back pain, gait problem, joint swelling, myalgias, neck pain and neck stiffness.  Skin: Negative for color change, rash and wound.  Neurological: Negative for dizziness, tremors, seizures, syncope, facial asymmetry, speech difficulty, weakness, light-headedness, numbness and headaches.  Hematological: Negative  for adenopathy. Does not bruise/bleed easily.  Psychiatric/Behavioral: Negative for suicidal ideas, hallucinations, behavioral problems, confusion, sleep disturbance, self-injury, dysphoric mood, decreased concentration and agitation. The patient is not nervous/anxious.        Objective:   Physical Exam  Constitutional: He appears well-developed and well-nourished.  HENT:  Head: Normocephalic and atraumatic.  Right Ear: External ear normal.  Left Ear: External ear normal.  Nose: Nose normal.  Mouth/Throat: Oropharynx is clear and moist.  Eyes: Conjunctivae and EOM are normal. Pupils are equal, round, and reactive to light. No scleral icterus.  Neck: Normal range of motion. Neck supple. No JVD present. No thyromegaly present.  Decreased right carotid upstroke. No bruits  Cardiovascular: Regular rhythm and normal heart sounds.  Exam reveals no gallop and no friction rub.   No murmur heard. Pedal pulses absent  Pulmonary/Chest: Effort normal and breath sounds normal. He exhibits no tenderness.  Abdominal: Soft. Bowel sounds are normal. He exhibits no distension and no mass. There is no tenderness.  Genitourinary: Penis normal. Guaiac negative stool. No penile tenderness.  +2 enlarged  Musculoskeletal: Normal range of motion. He exhibits no edema and no tenderness.  Lymphadenopathy:    He has no cervical adenopathy.  Neurological: He is alert. He has normal reflexes. No cranial nerve  deficit. Coordination normal.  Skin: Skin is warm and dry. No rash noted.  Psychiatric: He has a normal mood and affect. His behavior is normal.          Assessment & Plan:   Preventive health examination PAD-  remained stable without claudication or focal neurological symptoms Colonic polyps BPH ED   Laboratory data including lipid profile will be reviewed  return in one year or as needed Follow carotid artery Doppler study The patient was encouraged to resume statin therapy as well as daily  low-dose aspirin.

## 2014-04-24 ENCOUNTER — Encounter: Payer: Self-pay | Admitting: Internal Medicine

## 2014-04-24 ENCOUNTER — Ambulatory Visit (INDEPENDENT_AMBULATORY_CARE_PROVIDER_SITE_OTHER): Payer: Medicare Other | Admitting: Internal Medicine

## 2014-04-24 VITALS — BP 130/68 | HR 73 | Temp 98.9°F | Resp 20 | Ht 70.5 in | Wt 178.0 lb

## 2014-04-24 DIAGNOSIS — K5730 Diverticulosis of large intestine without perforation or abscess without bleeding: Secondary | ICD-10-CM

## 2014-04-24 DIAGNOSIS — R109 Unspecified abdominal pain: Secondary | ICD-10-CM

## 2014-04-24 NOTE — Progress Notes (Signed)
Pre-visit discussion using our clinic review tool. No additional management support is needed unless otherwise documented below in the visit note.  

## 2014-04-24 NOTE — Patient Instructions (Signed)
Call or return to clinic prn if these symptoms worsen or fail to improve as anticipated. Diverticulosis Diverticulosis is the condition that develops when small pouches (diverticula) form in the wall of your colon. Your colon, or large intestine, is where water is absorbed and stool is formed. The pouches form when the inside layer of your colon pushes through weak spots in the outer layers of your colon. CAUSES  No one knows exactly what causes diverticulosis. RISK FACTORS  Being older than 2. Your risk for this condition increases with age. Diverticulosis is rare in people younger than 40 years. By age 72, almost everyone has it.  Eating a low-fiber diet.  Being frequently constipated.  Being overweight.  Not getting enough exercise.  Smoking.  Taking over-the-counter pain medicines, like aspirin and ibuprofen. SYMPTOMS  Most people with diverticulosis do not have symptoms. DIAGNOSIS  Because diverticulosis often has no symptoms, health care providers often discover the condition during an exam for other colon problems. In many cases, a health care provider will diagnose diverticulosis while using a flexible scope to examine the colon (colonoscopy). TREATMENT  If you have never developed an infection related to diverticulosis, you may not need treatment. If you have had an infection before, treatment may include:  Eating more fruits, vegetables, and grains.  Taking a fiber supplement.  Taking a live bacteria supplement (probiotic).  Taking medicine to relax your colon. HOME CARE INSTRUCTIONS   Drink at least 6-8 glasses of water each day to prevent constipation.  Try not to strain when you have a bowel movement.  Keep all follow-up appointments. If you have had an infection before:  Increase the fiber in your diet as directed by your health care provider or dietitian.  Take a dietary fiber supplement if your health care provider approves.  Only take medicines as  directed by your health care provider. SEEK MEDICAL CARE IF:   You have abdominal pain.  You have bloating.  You have cramps.  You have not gone to the bathroom in 3 days. SEEK IMMEDIATE MEDICAL CARE IF:   Your pain gets worse.  Yourbloating becomes very bad.  You have a fever or chills, and your symptoms suddenly get worse.  You begin vomiting.  You have bowel movements that are bloody or black. MAKE SURE YOU:  Understand these instructions.  Will watch your condition.  Will get help right away if you are not doing well or get worse. Document Released: 07/21/2004 Document Revised: 10/29/2013 Document Reviewed: 09/18/2013 [facility] Patient Information 2015 [place], [place]. This information is not intended to replace advice given to you by your health care provider. Make sure you discuss any questions you have with your health care provider. High-Fiber Diet Fiber is found in fruits, vegetables, and grains. A high-fiber diet encourages the addition of more whole grains, legumes, fruits, and vegetables in your diet. The recommended amount of fiber for adult males is 38 g per day. For adult females, it is 25 g per day. Pregnant and lactating women should get 28 g of fiber per day. If you have a digestive or bowel problem, ask your caregiver for advice before adding high-fiber foods to your diet. Eat a variety of high-fiber foods instead of only a select few type of foods.  PURPOSE  To increase stool bulk.  To make bowel movements more regular to prevent constipation.  To lower cholesterol.  To prevent overeating. WHEN IS THIS DIET USED?  It may be used if you have constipation  and hemorrhoids.  It may be used if you have uncomplicated diverticulosis (intestine condition) and irritable bowel syndrome.  It may be used if you need help with weight management.  It may be used if you want to add it to your diet as a protective measure against atherosclerosis, diabetes, and  cancer. SOURCES OF FIBER  Whole-grain breads and cereals.  Fruits, such as apples, oranges, bananas, berries, prunes, and pears.  Vegetables, such as green peas, carrots, sweet potatoes, beets, broccoli, cabbage, spinach, and artichokes.  Legumes, such split peas, soy, lentils.  Almonds. FIBER CONTENT IN FOODS Starches and Grains / Dietary Fiber (g)  Cheerios, 1 cup / 3 g  Corn Flakes cereal, 1 cup / 0.7 g  Rice crispy treat cereal, 1 cup / 0.3 g  Instant oatmeal (cooked),  cup / 2 g  Frosted wheat cereal, 1 cup / 5.1 g  Brown, long-grain rice (cooked), 1 cup / 3.5 g  White, long-grain rice (cooked), 1 cup / 0.6 g  Enriched macaroni (cooked), 1 cup / 2.5 g Legumes / Dietary Fiber (g)  Baked beans (canned, plain, or vegetarian),  cup / 5.2 g  Kidney beans (canned),  cup / 6.8 g  Pinto beans (cooked),  cup / 5.5 g Breads and Crackers / Dietary Fiber (g)  Plain or honey graham crackers, 2 squares / 0.7 g  Saltine crackers, 3 squares / 0.3 g  Plain, salted pretzels, 10 pieces / 1.8 g  Whole-wheat bread, 1 slice / 1.9 g  White bread, 1 slice / 0.7 g  Raisin bread, 1 slice / 1.2 g  Plain bagel, 3 oz / 2 g  Flour tortilla, 1 oz / 0.9 g  Corn tortilla, 1 small / 1.5 g  Hamburger or hotdog bun, 1 small / 0.9 g Fruits / Dietary Fiber (g)  Apple with skin, 1 medium / 4.4 g  Sweetened applesauce,  cup / 1.5 g  Banana,  medium / 1.5 g  Grapes, 10 grapes / 0.4 g  Orange, 1 small / 2.3 g  Raisin, 1.5 oz / 1.6 g  Melon, 1 cup / 1.4 g Vegetables / Dietary Fiber (g)  Green beans (canned),  cup / 1.3 g  Carrots (cooked),  cup / 2.3 g  Broccoli (cooked),  cup / 2.8 g  Peas (cooked),  cup / 4.4 g  Mashed potatoes,  cup / 1.6 g  Lettuce, 1 cup / 0.5 g  Corn (canned),  cup / 1.6 g  Tomato,  cup / 1.1 g Document Released: 10/24/2005 Document Revised: 04/24/2012 Document Reviewed: 01/26/2012 [employer] Patient Information 2015 [place],  [place]. This information is not intended to replace advice given to you by your health care provider. Make sure you discuss any questions you have with your health care provider.

## 2014-04-24 NOTE — Progress Notes (Signed)
Subjective:    Patient ID: Connor Neal, male    DOB: 10-15-1931, 80 y.o.   MRN: [id number]  HPI  80 year old patient, who presents with chief complaint of left lower quadrant pain.  He states that this has been present since his last colonoscopy a number of years ago.  This study did reveal diverticular disease.  He describes pain in the left lower quadrant that has intensified over the past 2 weeks.  He states the pain lasted 15 seconds and occurs one to 2 times per day, but often goes days between episodes of discomfort.  No fever, or other constitutional complaints.  No change in his bowel habits.  Denies any significant constipation issues  Past Medical History  Diagnosis Date  . ABDOMINAL PAIN 11/14/2008  . BENIGN PROSTATIC HYPERTROPHY 02/13/2008  . COLONIC POLYPS, HX OF 08/14/2008  . COLONIC POLYPS 01/04/2008  . Diverticulosis of colon (without mention of hemorrhage) 01/04/2008  . GERD 11/17/2010  . Internal hemorrhoids with other complication 06/24/2992  . NECK PAIN 06/30/2010  . Other symptoms involving cardiovascular system 07/01/2010  . PROSTATITIS, RECURRENT 01/04/2008  . PVD 08/17/2010  . TOBACCO USE, QUIT 08/14/2009    History   Social History  . Marital Status: Married    Spouse Name: N/A    Number of Children: N/A  . Years of Education: N/A   Occupational History  . Not on file.   Social History Main Topics  . Smoking status: Former Smoker    Quit date: 11/07/1958  . Smokeless tobacco: Never Used  . Alcohol Use: No  . Drug Use: No  . Sexual Activity: Not on file   Other Topics Concern  . Not on file   Social History Narrative  . No narrative on file    Past Surgical History  Procedure Laterality Date  . Transurethral resection of prostate      No family history on file.  No Known Allergies  Current Outpatient Prescriptions on File Prior to Visit  Medication Sig Dispense Refill  . ibuprofen (ADVIL,MOTRIN) 200 MG tablet Take 200 mg by mouth as needed.       . naproxen sodium (ALEVE) 220 MG tablet Take 220 mg by mouth as needed.      . pravastatin (PRAVACHOL) 20 MG tablet Take 1 tablet (20 mg total) by mouth daily.  90 tablet  6  . tadalafil (CIALIS) 20 MG tablet Take 1 tablet (20 mg total) by mouth daily as needed for erectile dysfunction.  10 tablet  6  . Tamsulosin HCl (FLOMAX) 0.4 MG CAPS Take 1 capsule (0.4 mg total) by mouth daily.  90 capsule  6   No current facility-administered medications on file prior to visit.    BP 130/68  Pulse 73  Temp(Src) 98.9 F (37.2 C) (Oral)  Resp 20  Ht 5' 10.5" (1.791 m)  Wt 178 lb (80.74 kg)  BMI 25.17 kg/m2  SpO2 97%       Review of Systems  Constitutional: Negative for fever, chills, appetite change and fatigue.  HENT: Negative for congestion, dental problem, ear pain, hearing loss, sore throat, tinnitus, trouble swallowing and voice change.   Eyes: Negative for pain, discharge and visual disturbance.  Respiratory: Negative for cough, chest tightness, wheezing and stridor.   Cardiovascular: Negative for chest pain, palpitations and leg swelling.  Gastrointestinal: Positive for abdominal pain. Negative for nausea, vomiting, diarrhea, constipation, blood in stool and abdominal distention.  Genitourinary: Negative for urgency, hematuria, flank pain, discharge, difficulty  urinating and genital sores.  Musculoskeletal: Negative for arthralgias, back pain, gait problem, joint swelling, myalgias and neck stiffness.  Skin: Negative for rash.  Neurological: Negative for dizziness, syncope, speech difficulty, weakness, numbness and headaches.  Hematological: Negative for adenopathy. Does not bruise/bleed easily.  Psychiatric/Behavioral: Negative for behavioral problems and dysphoric mood. The patient is not nervous/anxious.        Objective:   Physical Exam  Constitutional: He is oriented to person, place, and time. He appears well-developed.  HENT:  Head: Normocephalic.  Right Ear:  External ear normal.  Left Ear: External ear normal.  Eyes: Conjunctivae and EOM are normal.  Neck: Normal range of motion.  Cardiovascular: Normal rate and normal heart sounds.   Pulmonary/Chest: Breath sounds normal.  Abdominal: Soft. Bowel sounds are normal. He exhibits no distension and no mass. There is no tenderness. There is no rebound and no guarding.  Musculoskeletal: Normal range of motion. He exhibits no edema and no tenderness.  Neurological: He is alert and oriented to person, place, and time.  Psychiatric: He has a normal mood and affect. His behavior is normal.          Assessment & Plan:   Intermittent left lower quadrant pain.  Benign clinical exam.  Symptoms seem fairly mild and intermittent.  He has known diverticular disease.  We'll place on a high fiber diet and observe at this time

## 2014-07-15 ENCOUNTER — Other Ambulatory Visit: Payer: Self-pay | Admitting: Internal Medicine

## 2014-12-05 ENCOUNTER — Telehealth: Payer: Self-pay | Admitting: Internal Medicine

## 2014-12-05 NOTE — Telephone Encounter (Signed)
FYI

## 2014-12-05 NOTE — Telephone Encounter (Signed)
error 

## 2014-12-05 NOTE — Telephone Encounter (Signed)
FYI:  Patient has been admitted into to [facility].  He became unresponsive at a [place] appointment while having his hearing aid check.  Patient's daughter also stated that patient's wife, Karis Emig dies 2014-07-26.

## 2015-04-10 ENCOUNTER — Encounter: Payer: Self-pay | Admitting: Internal Medicine

## 2015-04-10 ENCOUNTER — Ambulatory Visit (INDEPENDENT_AMBULATORY_CARE_PROVIDER_SITE_OTHER): Payer: Medicare Other | Admitting: Internal Medicine

## 2015-04-10 VITALS — BP 102/60 | HR 66 | Temp 98.2°F | Resp 20 | Ht 70.5 in | Wt 169.0 lb

## 2015-04-10 DIAGNOSIS — K219 Gastro-esophageal reflux disease without esophagitis: Secondary | ICD-10-CM | POA: Diagnosis not present

## 2015-04-10 DIAGNOSIS — I2699 Other pulmonary embolism without acute cor pulmonale: Secondary | ICD-10-CM | POA: Diagnosis not present

## 2015-04-10 DIAGNOSIS — E785 Hyperlipidemia, unspecified: Secondary | ICD-10-CM | POA: Diagnosis not present

## 2015-04-10 NOTE — Patient Instructions (Signed)
Report any new or worsening symptoms  Schedule annual exam in 4 months

## 2015-04-10 NOTE — Progress Notes (Signed)
Subjective:    Patient ID: Connor Neal, male    DOB: [birth year], 81 y.o.   MRN: [id number]  HPI 81 year old patient whose chronic medical problems include dyslipidemia history colonic polyps, and BPH. Complaints today include discomfort over the right collarbone area as well as a bulge in the right groin area.  The hernia has been present for about 6 weeks and causes no pain.  His right collarbone area has been sore for about 2 weeks.  No known trauma.  Past medical history reviewed.  Patient was hospitalized at it during hospital for a pulmonary embolism in February of this year.  He remains on Lovenox and the plan is to continue need treatment for 6 months  Past Medical History  Diagnosis Date  . ABDOMINAL PAIN 11/14/2008  . BENIGN PROSTATIC HYPERTROPHY 02/13/2008  . COLONIC POLYPS, HX OF 08/14/2008  . COLONIC POLYPS 01/04/2008  . Diverticulosis of colon (without mention of hemorrhage) 01/04/2008  . GERD 11/17/2010  . Internal hemorrhoids with other complication 3/53/6144  . NECK PAIN 06/30/2010  . Other symptoms involving cardiovascular system 07/01/2010  . PROSTATITIS, RECURRENT 01/04/2008  . PVD 08/17/2010  . TOBACCO USE, QUIT 08/14/2009    History   Social History  . Marital Status: Married    Spouse Name: N/A  . Number of Children: N/A  . Years of Education: N/A   Occupational History  . Not on file.   Social History Main Topics  . Smoking status: Former Smoker    Quit date: 11/07/1958  . Smokeless tobacco: Never Used  . Alcohol Use: No  . Drug Use: No  . Sexual Activity: Not on file   Other Topics Concern  . Not on file   Social History Narrative    Past Surgical History  Procedure Laterality Date  . Transurethral resection of prostate      No family history on file.  No Known Allergies  Current Outpatient Prescriptions on File Prior to Visit  Medication Sig Dispense Refill  . ibuprofen (ADVIL,MOTRIN) 200 MG tablet Take 200 mg by mouth as needed.    .  pravastatin (PRAVACHOL) 20 MG tablet Take 1 tablet (20 mg total) by mouth daily. 90 tablet 6  . tadalafil (CIALIS) 20 MG tablet Take 1 tablet (20 mg total) by mouth daily as needed for erectile dysfunction. 10 tablet 6  . tamsulosin (FLOMAX) 0.4 MG CAPS capsule TAKE 1 CAPSULE BY MOUTH DAILY 90 capsule 1   No current facility-administered medications on file prior to visit.    BP 102/60 mmHg  Pulse 66  Temp(Src) 98.2 F (36.8 C) (Oral)  Resp 20  Ht 5' 10.5" (1.791 m)  Wt 169 lb (76.658 kg)  BMI 23.90 kg/m2  SpO2 98%     Review of Systems  Musculoskeletal: Positive for arthralgias.       Objective:   Physical Exam  Constitutional: He is oriented to person, place, and time. He appears well-developed.  HENT:  Head: Normocephalic.  Right Ear: External ear normal.  Left Ear: External ear normal.  Eyes: Conjunctivae and EOM are normal.  Neck: Normal range of motion.  Cardiovascular: Normal rate and normal heart sounds.   Pulmonary/Chest: Breath sounds normal.  Mild tenderness over the right sternoclavicular joint area  Abdominal: Bowel sounds are normal.  Small, easily reducible right inguinal hernia  Musculoskeletal: Normal range of motion. He exhibits no edema or tenderness.  Neurological: He is alert and oriented to person, place, and time.  Psychiatric: He has a  normal mood and affect. His behavior is normal.          Assessment & Plan:   Pain over right sternoclavicular joint.  Pain has been present for 2 weeks.  Will treat with Tylenol, warm compresses and clinically observe.  Will schedule annual exam Asymptomatic right inguinal hernia.  He will report any pain or enlargement History of pulmonary embolism February 2016.  Will complete 6 months of Lovenox  Schedule CPX Patient report any new or worsening symptoms

## 2015-04-10 NOTE — Progress Notes (Signed)
Pre visit review using our clinic review tool, if applicable. No additional management support is needed unless otherwise documented below in the visit note. 

## 2015-04-14 ENCOUNTER — Telehealth: Payer: Self-pay | Admitting: Internal Medicine

## 2015-04-14 DIAGNOSIS — M25511 Pain in right shoulder: Secondary | ICD-10-CM

## 2015-04-14 NOTE — Telephone Encounter (Signed)
Please schedule x-ray of the upper sternum; special attention to the right sternoclavicular joint

## 2015-04-14 NOTE — Telephone Encounter (Signed)
Please advise 

## 2015-04-14 NOTE — Telephone Encounter (Signed)
Patient called back stating he is not any better and would like to proceed with the x-ray of his collar bone and throat.

## 2015-04-15 ENCOUNTER — Ambulatory Visit (INDEPENDENT_AMBULATORY_CARE_PROVIDER_SITE_OTHER)
Admission: RE | Admit: 2015-04-15 | Discharge: 2015-04-15 | Disposition: A | Discharge status: Home / Self Care | Payer: Medicare Other | Source: Ambulatory Visit | Attending: Internal Medicine | Admitting: Internal Medicine

## 2015-04-15 DIAGNOSIS — M25511 Pain in right shoulder: Secondary | ICD-10-CM

## 2015-04-15 NOTE — Telephone Encounter (Signed)
Spoke to pt, told him put order in for x-ray, you can go to [facility] on [facility]. Told pt you do not need an appointment just go sometime today. Pt verbalized understanding.

## 2015-06-19 ENCOUNTER — Other Ambulatory Visit: Payer: Self-pay | Admitting: Internal Medicine

## 2015-08-06 ENCOUNTER — Ambulatory Visit (INDEPENDENT_AMBULATORY_CARE_PROVIDER_SITE_OTHER): Payer: Medicare Other | Admitting: Internal Medicine

## 2015-08-06 ENCOUNTER — Encounter: Payer: Self-pay | Admitting: Internal Medicine

## 2015-08-06 VITALS — BP 110/60 | HR 48 | Temp 98.4°F | Resp 18 | Ht 70.5 in | Wt 167.0 lb

## 2015-08-06 DIAGNOSIS — S39012A Strain of muscle, fascia and tendon of lower back, initial encounter: Secondary | ICD-10-CM

## 2015-08-06 DIAGNOSIS — Z23 Encounter for immunization: Secondary | ICD-10-CM | POA: Diagnosis not present

## 2015-08-06 DIAGNOSIS — S338XXA Sprain of other parts of lumbar spine and pelvis, initial encounter: Secondary | ICD-10-CM | POA: Diagnosis not present

## 2015-08-06 MED ORDER — PREDNISONE 10 MG PO TABS: 10.0000 mg | ORAL_TABLET | Freq: Two times a day (BID) | ORAL | Status: DC

## 2015-08-06 NOTE — Patient Instructions (Signed)
Most patients with low back pain will improve with time over the next two to 6 weeks.  Keep active but avoid any activities that cause pain.  Apply moist heat to the low back area several times daily. 

## 2015-08-06 NOTE — Progress Notes (Signed)
Subjective:    Patient ID: Connor Neal, male    DOB: 01/26/1931, 81 y.o.   MRN: [id number]  HPI  81 year old patient who is on chronic anticoagulation due to a prior pulmonary embolism.  He presents with a three-week history of some discomfort in the right lumbar back.  Yesterday he lifted a heavy object and has had worsening pain in the right lumbar region.  There is no radiation of the pain.  No motor weakness.  No constitutional complaints.  Past Medical History  Diagnosis Date  . ABDOMINAL PAIN 11/14/2008  . BENIGN PROSTATIC HYPERTROPHY 02/13/2008  . COLONIC POLYPS, HX OF 08/14/2008  . COLONIC POLYPS 01/04/2008  . Diverticulosis of colon (without mention of hemorrhage) 01/04/2008  . GERD 11/17/2010  . Internal hemorrhoids with other complication 1/79/1505  . NECK PAIN 06/30/2010  . Other symptoms involving cardiovascular system 07/01/2010  . PROSTATITIS, RECURRENT 01/04/2008  . PVD 08/17/2010  . TOBACCO USE, QUIT 08/14/2009    Social History   Social History  . Marital Status: Married    Spouse Name: N/A  . Number of Children: N/A  . Years of Education: N/A   Occupational History  . Not on file.   Social History Main Topics  . Smoking status: Former Smoker    Quit date: 11/07/1958  . Smokeless tobacco: Never Used  . Alcohol Use: No  . Drug Use: No  . Sexual Activity: Not on file   Other Topics Concern  . Not on file   Social History Narrative    Past Surgical History  Procedure Laterality Date  . Transurethral resection of prostate      No family history on file.  No Known Allergies  Current Outpatient Prescriptions on File Prior to Visit  Medication Sig Dispense Refill  . ibuprofen (ADVIL,MOTRIN) 200 MG tablet Take 200 mg by mouth as needed.    . tamsulosin (FLOMAX) 0.4 MG CAPS capsule TAKE 1 CAPSULE BY MOUTH DAILY 90 capsule 1   No current facility-administered medications on file prior to visit.    BP 110/60 mmHg  Pulse 48  Temp(Src) 98.4 F (36.9  C) (Oral)  Resp 18  Ht 5' 10.5" (1.791 m)  Wt 167 lb (75.751 kg)  BMI 23.62 kg/m2  SpO2 98%     Review of Systems  Constitutional: Negative for fever, chills, appetite change and fatigue.  HENT: Negative for congestion, dental problem, ear pain, hearing loss, sore throat, tinnitus, trouble swallowing and voice change.   Eyes: Negative for pain, discharge and visual disturbance.  Respiratory: Negative for cough, chest tightness, wheezing and stridor.   Cardiovascular: Negative for chest pain, palpitations and leg swelling.  Gastrointestinal: Negative for nausea, vomiting, abdominal pain, diarrhea, constipation, blood in stool and abdominal distention.  Genitourinary: Negative for urgency, hematuria, flank pain, discharge, difficulty urinating and genital sores.  Musculoskeletal: Positive for back pain. Negative for myalgias, joint swelling, arthralgias, gait problem and neck stiffness.  Skin: Negative for rash.  Neurological: Negative for dizziness, syncope, speech difficulty, weakness, numbness and headaches.  Hematological: Negative for adenopathy. Does not bruise/bleed easily.  Psychiatric/Behavioral: Negative for behavioral problems and dysphoric mood. The patient is not nervous/anxious.        Objective:   Physical Exam  Constitutional: He appears well-developed and well-nourished. No distress.  Musculoskeletal:  Mild tenderness in the right lumbar musculature Negative straight leg testing Full range of motion both hips Surprisingly flexible          Assessment & Plan:  Lumbosacral strain.  Will treat symptomatically History of pulmonary embolism on chronic anticoagulation.  Will avoid nonsteroidal anti-inflammatory medication

## 2015-08-13 ENCOUNTER — Encounter: Payer: Self-pay | Admitting: Internal Medicine

## 2015-08-25 ENCOUNTER — Ambulatory Visit (INDEPENDENT_AMBULATORY_CARE_PROVIDER_SITE_OTHER): Payer: Medicare Other | Admitting: Internal Medicine

## 2015-08-25 ENCOUNTER — Encounter: Payer: Self-pay | Admitting: Internal Medicine

## 2015-08-25 VITALS — BP 120/60 | HR 62 | Temp 98.4°F | Resp 20 | Ht 70.5 in | Wt 162.0 lb

## 2015-08-25 DIAGNOSIS — I2699 Other pulmonary embolism without acute cor pulmonale: Secondary | ICD-10-CM

## 2015-08-25 DIAGNOSIS — I739 Peripheral vascular disease, unspecified: Secondary | ICD-10-CM

## 2015-08-25 DIAGNOSIS — Z86010 Personal history of colonic polyps: Secondary | ICD-10-CM | POA: Diagnosis not present

## 2015-08-25 DIAGNOSIS — E785 Hyperlipidemia, unspecified: Secondary | ICD-10-CM | POA: Diagnosis not present

## 2015-08-25 DIAGNOSIS — Z0000 Encounter for general adult medical examination without abnormal findings: Secondary | ICD-10-CM

## 2015-08-25 DIAGNOSIS — I6523 Occlusion and stenosis of bilateral carotid arteries: Secondary | ICD-10-CM

## 2015-08-25 LAB — LIPID PANEL
CHOLESTEROL: 192 mg/dL (ref 0–200)
HDL: 85.4 mg/dL (ref >39.00)
LDL Cholesterol: 95 mg/dL (ref 0–99)
NonHDL: 106.5
Total CHOL/HDL Ratio: 2
Triglycerides: 60 mg/dL (ref 0.0–149.0)
VLDL: 12 mg/dL (ref 0.0–40.0)

## 2015-08-25 LAB — CBC WITH DIFFERENTIAL/PLATELET
Basophils Absolute: 0 10*3/uL (ref 0.0–0.1)
Basophils Relative: 0.4 % (ref 0.0–3.0)
Eosinophils Absolute: 0 10*3/uL (ref 0.0–0.7)
Eosinophils Relative: 0.7 % (ref 0.0–5.0)
HCT: 40.8 % (ref 39.0–52.0)
HEMOGLOBIN: 13.4 g/dL (ref 13.0–17.0)
LYMPHS ABS: 1.5 10*3/uL (ref 0.7–4.0)
Lymphocytes Relative: 28 % (ref 12.0–46.0)
MCHC: 32.8 g/dL (ref 30.0–36.0)
MCV: 88.8 fl (ref 78.0–100.0)
MONO ABS: 0.6 10*3/uL (ref 0.1–1.0)
Monocytes Relative: 10.4 % (ref 3.0–12.0)
NEUTROS PCT: 60.5 % (ref 43.0–77.0)
Neutro Abs: 3.2 10*3/uL (ref 1.4–7.7)
Platelets: 198 10*3/uL (ref 150.0–400.0)
RBC: 4.6 Mil/uL (ref 4.22–5.81)
RDW: 14.4 % (ref 11.5–15.5)
WBC: 5.4 10*3/uL (ref 4.0–10.5)

## 2015-08-25 LAB — COMPREHENSIVE METABOLIC PANEL
ALK PHOS: 69 U/L (ref 39–117)
ALT: 9 U/L (ref 0–53)
AST: 17 U/L (ref 0–37)
Albumin: 3.7 g/dL (ref 3.5–5.2)
BUN: 17 mg/dL (ref 6–23)
CO2: 34 mEq/L — ABNORMAL HIGH (ref 19–32)
CREATININE: 1.09 mg/dL (ref 0.40–1.50)
Calcium: 9.5 mg/dL (ref 8.4–10.5)
Chloride: 103 mEq/L (ref 96–112)
GFR: 83.2 mL/min (ref >60.00)
Glucose, Bld: 88 mg/dL (ref 70–99)
Potassium: 4.4 mEq/L (ref 3.5–5.1)
SODIUM: 142 meq/L (ref 135–145)
TOTAL PROTEIN: 6.7 g/dL (ref 6.0–8.3)
Total Bilirubin: 0.6 mg/dL (ref 0.2–1.2)

## 2015-08-25 LAB — TSH: TSH: 0.8 u[IU]/mL (ref 0.35–4.50)

## 2015-08-25 MED ORDER — PRAVASTATIN SODIUM 20 MG PO TABS: 20.0000 mg | ORAL_TABLET | Freq: Every day | ORAL | Status: DC

## 2015-08-25 NOTE — Progress Notes (Signed)
Pre visit review using our clinic review tool, if applicable. No additional management support is needed unless otherwise documented below in the visit note. 

## 2015-08-25 NOTE — Progress Notes (Signed)
Patient ID: Connor Neal, male   DOB: July 27, 1931, 81 y.o.   MRN: [id number]  Subjective:    Patient ID: Connor Neal, male    DOB: May 01, 1931, 81 y.o.   MRN: [id number]  Hyperlipidemia Associated symptoms include chest pain. Pertinent negatives include no myalgias or shortness of breath.   CC: cpx - doing well.   History of Present Illness:   81  year-old patient who is seen today for a comprehensive evaluation.   Patient has a history of peripheral vascular disease with a totally occluded right ICA. Denies any focal neurological symptoms. He also has absent pedal pulses but denies any claudication. He walks 1 mile approximately 3 times per week and denies any exertional chest pain or claudication.  He does have a history of GERD and does take diclofenac occasionally. He states that he has discontinued both pravastatin and daily aspirin  He had a pulmonary embolism in February 2016 and was treated with 6 months of Lovenox.  This was a unprovoked PE that presented with syncope while at the [facility] in [place] for an eye examination.  He now is on chronic Eliquis , which she obtains from the [place] hospital system without charge.  He feels this medication is associated with vague chest discomfort and he is only taking  it sporadically  He does have a history of colonic polyps and his last colonoscopy was approximately 6 years ago he wishes to defer additional screening and consider colonoscopy for diagnostic purposes only  Here for Medicare AWV:   1. Risk factors based on Past M, S, F history: vascular risk factors include family history of coronary artery disease; he has a history of peripheral vascular occlusive disease and dyslipidemia 2. Physical Activities: remains quite active and physical sense; continues to work occasionally  3. Depression/mood: no history of depression or mood disorder  4. Hearing: no hearing deficits  5. ADL's: remains independent in all aspects of daily living  6.  Fall Risk: low  7. Home Safety: no polyps identified  8. Height, weight, &visual acuity:height and weight stable. No difficulty with visual acuity  9. Counseling: heart healthy diet regular. Exercise encouraged  10. Labs ordered based on risk factors: laboratory profile will be reviewed, including lipid profile  11. Referral Coordination-  needs followup carotid artery Doppler study  12. Care Plan- exercise heart healthy diet will be continued. His medical regimen will be unchanged. This includes an alpha blocker only for BPH  13. Cognitive Assessment- alert and oriented, with normal affect. No memory difficulties. Able to handle all executive functions without difficulty  14.  Preventive services will include annual clinical examinations with screening lab.  No further screening colonoscopies will be obtained, but will be considered for diagnostic studies 15.  Provider list includes primary care and ophthalmology.  Allergies (verified):  No Known Drug Allergies  Past History:  Past Medical History:  left lower quadrant pain  Benign prostatic hypertrophy  Colonic polyps, hx of  erectile dysfunction  urge incontinence  Diverticulosis, colon  complete right ICA occlusion  History of pulmonary embolism February 2016  Past Surgical History:   circumcision at age 4  status post transurethral destruction of the prostate by radiofrequency thermotherapy  colonoscopy December 2007   Family History:    father died age 79  mother died age 79  79 brothers and 16 sisters; positive for coronary artery disease, and possible stomach cancer;  2 B, 3 S deceased   Social History:  Married  Regular exercise-yes  Past Medical History  Diagnosis Date  . ABDOMINAL PAIN 11/14/2008  . BENIGN PROSTATIC HYPERTROPHY 02/13/2008  . COLONIC POLYPS, HX OF 08/14/2008  . COLONIC POLYPS 01/04/2008  . Diverticulosis of colon (without mention of hemorrhage) 01/04/2008  . GERD 11/17/2010  . Internal  hemorrhoids with other complication 4/49/6759  . NECK PAIN 06/30/2010  . Other symptoms involving cardiovascular system 07/01/2010  . PROSTATITIS, RECURRENT 01/04/2008  . PVD 08/17/2010  . TOBACCO USE, QUIT 08/14/2009    Social History   Social History  . Marital Status: Married    Spouse Name: N/A  . Number of Children: N/A  . Years of Education: N/A   Occupational History  . Not on file.   Social History Main Topics  . Smoking status: Former Smoker    Quit date: 11/07/1958  . Smokeless tobacco: Never Used  . Alcohol Use: No  . Drug Use: No  . Sexual Activity: Not on file   Other Topics Concern  . Not on file   Social History Narrative    Past Surgical History  Procedure Laterality Date  . Transurethral resection of prostate      No family history on file.  No Known Allergies  Current Outpatient Prescriptions on File Prior to Visit  Medication Sig Dispense Refill  . apixaban (ELIQUIS) 2.5 MG TABS tablet Take 2.5 mg by mouth 2 (two) times daily.    . tamsulosin (FLOMAX) 0.4 MG CAPS capsule TAKE 1 CAPSULE BY MOUTH DAILY 90 capsule 1   No current facility-administered medications on file prior to visit.    BP 120/60 mmHg  Pulse 62  Temp(Src) 98.4 F (36.9 C) (Oral)  Resp 20  Ht 5' 10.5" (1.791 m)  Wt 162 lb (73.483 kg)  BMI 22.91 kg/m2  SpO2 98%     Review of Systems  Constitutional: Negative for fever, chills, activity change, appetite change and fatigue.  HENT: Negative for congestion, dental problem, ear pain, hearing loss, mouth sores, rhinorrhea, sinus pressure, sneezing, tinnitus, trouble swallowing and voice change.   Eyes: Negative for photophobia, pain, redness and visual disturbance.  Respiratory: Negative for apnea, cough, choking, chest tightness, shortness of breath and wheezing.   Cardiovascular: Positive for chest pain. Negative for palpitations and leg swelling.       No claudication  Gastrointestinal: Negative for nausea, vomiting,  abdominal pain, diarrhea, constipation, blood in stool, abdominal distention, anal bleeding and rectal pain.  Genitourinary: Negative for dysuria, urgency, frequency, hematuria, flank pain, decreased urine volume, discharge, penile swelling, scrotal swelling, difficulty urinating, genital sores and testicular pain.  Musculoskeletal: Negative for myalgias, back pain, joint swelling, arthralgias, gait problem, neck pain and neck stiffness.  Skin: Negative for color change, rash and wound.  Neurological: Negative for dizziness, tremors, seizures, syncope, facial asymmetry, speech difficulty, weakness, light-headedness, numbness and headaches.  Hematological: Negative for adenopathy. Does not bruise/bleed easily.  Psychiatric/Behavioral: Negative for suicidal ideas, hallucinations, behavioral problems, confusion, sleep disturbance, self-injury, dysphoric mood, decreased concentration and agitation. The patient is not nervous/anxious.        Objective:   Physical Exam  Constitutional: He appears well-developed and well-nourished.  HENT:  Head: Normocephalic and atraumatic.  Right Ear: External ear normal.  Left Ear: External ear normal.  Nose: Nose normal.  Mouth/Throat: Oropharynx is clear and moist.  Eyes: Conjunctivae and EOM are normal. Pupils are equal, round, and reactive to light. No scleral icterus.  Arcus senilis  Neck: Normal range of motion. Neck supple. No  JVD present. No thyromegaly present.  Decreased right carotid upstroke. No bruits  Cardiovascular: Regular rhythm and normal heart sounds.  Exam reveals no gallop and no friction rub.   No murmur heard. Pedal pulses absent  Pulmonary/Chest: Effort normal and breath sounds normal. He exhibits no tenderness.  Abdominal: Soft. Bowel sounds are normal. He exhibits no distension and no mass. There is no tenderness.  Genitourinary: Penis normal. Guaiac negative stool. No penile tenderness.  +2 enlarged  Musculoskeletal: Normal range  of motion. He exhibits no edema or tenderness.  Lymphadenopathy:    He has no cervical adenopathy.  Neurological: He is alert. He has normal reflexes. No cranial nerve deficit. Coordination normal.  Skin: Skin is warm and dry. No rash noted.  Psychiatric: He has a normal mood and affect. His behavior is normal.          Assessment & Plan:   Preventive health examination PAD-  remained stable without claudication or focal neurological symptoms Colonic polyps BPH ED History of unprovoked pulmonary embolism February 2016.  Chronic anticoagulation.  Encouraged   Laboratory data including lipid profile will be reviewed  return in one year or as needed Follow carotid artery Doppler study The patient was encouraged to resume statin therapy.

## 2015-08-25 NOTE — Patient Instructions (Signed)
Limit your sodium (Salt) intake    It is important that you exercise regularly, at least 20 minutes 3 to 4 times per week.  If you develop chest pain or shortness of breath seek  medical attention.  Return in one year for follow-up   

## 2015-08-28 ENCOUNTER — Telehealth: Payer: Self-pay | Admitting: Internal Medicine

## 2015-08-28 NOTE — Telephone Encounter (Signed)
°  Dr Raliegh Ip Juluis Rainier  Baxter Flattery called this pt to schedule his CAROTID     I CALLED THIS PT HE REFUSED TO HAVE DONE SAYS IT JUST TOO MUCH MONEY,PLEASE LET DR. Raliegh Ip KNOW . Per Hortencia Pilar clark @ [facility]

## 2015-08-31 ENCOUNTER — Telehealth: Payer: Self-pay | Admitting: Internal Medicine

## 2015-08-31 NOTE — Telephone Encounter (Signed)
Pt is calling back would like to know the purpose of getting carotid ultrasound

## 2015-08-31 NOTE — Telephone Encounter (Signed)
Dr.K, this pt refused to schedule due to cost. Please see message and advise.

## 2015-11-10 ENCOUNTER — Emergency Department (HOSPITAL_COMMUNITY): Payer: Medicare Other

## 2015-11-10 ENCOUNTER — Telehealth: Payer: Self-pay | Admitting: Internal Medicine

## 2015-11-10 ENCOUNTER — Emergency Department (HOSPITAL_COMMUNITY)
Admission: EM | Admit: 2015-11-10 | Discharge: 2015-11-10 | Disposition: A | Discharge status: Home / Self Care | Payer: Medicare Other | Attending: Emergency Medicine | Admitting: Emergency Medicine

## 2015-11-10 ENCOUNTER — Encounter (HOSPITAL_COMMUNITY): Payer: Self-pay | Admitting: Emergency Medicine

## 2015-11-10 DIAGNOSIS — R1031 Right lower quadrant pain: Secondary | ICD-10-CM

## 2015-11-10 DIAGNOSIS — Z7902 Long term (current) use of antithrombotics/antiplatelets: Secondary | ICD-10-CM | POA: Insufficient documentation

## 2015-11-10 DIAGNOSIS — Z87891 Personal history of nicotine dependence: Secondary | ICD-10-CM | POA: Insufficient documentation

## 2015-11-10 DIAGNOSIS — Z86010 Personal history of colonic polyps: Secondary | ICD-10-CM | POA: Insufficient documentation

## 2015-11-10 DIAGNOSIS — Z8719 Personal history of other diseases of the digestive system: Secondary | ICD-10-CM | POA: Insufficient documentation

## 2015-11-10 DIAGNOSIS — N400 Enlarged prostate without lower urinary tract symptoms: Secondary | ICD-10-CM | POA: Diagnosis not present

## 2015-11-10 DIAGNOSIS — Z79899 Other long term (current) drug therapy: Secondary | ICD-10-CM | POA: Insufficient documentation

## 2015-11-10 DIAGNOSIS — Z8679 Personal history of other diseases of the circulatory system: Secondary | ICD-10-CM | POA: Diagnosis not present

## 2015-11-10 MED ORDER — TRAMADOL HCL 50 MG PO TABS
50.0000 mg | ORAL_TABLET | Freq: Once | ORAL | Status: AC
  Administered 2015-11-10: 50 mg via ORAL
  Filled 2015-11-10: qty 1

## 2015-11-10 MED ORDER — TRAMADOL HCL 50 MG PO TABS: 50.0000 mg | ORAL_TABLET | Freq: Four times a day (QID) | ORAL | Status: DC | PRN

## 2015-11-10 NOTE — ED Notes (Signed)
Family at bedside. 

## 2015-11-10 NOTE — ED Provider Notes (Signed)
CSN: [id number]     Arrival date & time 11/10/15  1425 History   First MD Initiated Contact with Patient 11/10/15 1739     No chief complaint on file.    (Consider location/radiation/quality/duration/timing/severity/associated sxs/prior Treatment) Patient is a 82 y.o. male presenting with groin pain.  Groin Pain This is a new problem. The current episode started yesterday. The problem occurs constantly. The problem has been gradually worsening. The symptoms are aggravated by walking. He has tried rest and acetaminophen for the symptoms. The treatment provided mild relief.    Past Medical History  Diagnosis Date  . ABDOMINAL PAIN 11/14/2008  . BENIGN PROSTATIC HYPERTROPHY 02/13/2008  . COLONIC POLYPS, HX OF 08/14/2008  . COLONIC POLYPS 01/04/2008  . Diverticulosis of colon (without mention of hemorrhage) 01/04/2008  . GERD 11/17/2010  . Internal hemorrhoids with other complication [id number]  . NECK PAIN 06/30/2010  . Other symptoms involving cardiovascular system 07/01/2010  . PROSTATITIS, RECURRENT 01/04/2008  . PVD 08/17/2010  . TOBACCO USE, QUIT 08/14/2009   Past Surgical History  Procedure Laterality Date  . Transurethral resection of prostate     No family history on file. Social History  Substance Use Topics  . Smoking status: Former Smoker    Quit date: 11/07/1958  . Smokeless tobacco: Never Used  . Alcohol Use: No    Review of Systems  Musculoskeletal:       Right sided groin pain.  All other systems reviewed and are negative.     Allergies  Review of patient's allergies indicates no known allergies.  Home Medications   Prior to Admission medications   Medication Sig Start Date End Date Taking? Authorizing Provider  acetaminophen (TYLENOL) 325 MG tablet Take 650 mg by mouth every 6 (six) hours as needed for mild pain or moderate pain.   Yes Historical Provider, MD  apixaban (ELIQUIS) 2.5 MG TABS tablet Take 2.5 mg by mouth 2 (two) times daily.   Yes Historical  Provider, MD  pravastatin (PRAVACHOL) 20 MG tablet Take 1 tablet (20 mg total) by mouth daily. 08/25/15  Yes Marletta Lor, MD  tamsulosin (FLOMAX) 0.4 MG CAPS capsule Take 0.4 mg by mouth daily.   Yes Historical Provider, MD   BP 135/69 mmHg  Pulse 79  Temp(Src) 98 F (36.7 C) (Oral)  Resp 18  SpO2 97% Physical Exam  Constitutional: He is oriented to person, place, and time. He appears well-developed and well-nourished.  HENT:  Head: Normocephalic.  Eyes: Conjunctivae are normal.  Neck: Neck supple.  Cardiovascular: Normal rate and regular rhythm.   Pulmonary/Chest: Effort normal and breath sounds normal.  Abdominal: Soft. Hernia confirmed negative in the right inguinal area.  Genitourinary:    Right testis shows no swelling and no tenderness.  Musculoskeletal: He exhibits no edema.  Lymphadenopathy:    He has no cervical adenopathy.  Neurological: He is alert and oriented to person, place, and time.  Skin: Skin is warm and dry.  Nursing note and vitals reviewed.   ED Course  Procedures (including critical care time) Labs Review Labs Reviewed - No data to display  Imaging Review Ct Hip Right Wo Contrast  11/10/2015  CLINICAL DATA:  Right groin pain. Difficulty with ambulation. Patient states history of hernia. EXAM: CT OF THE RIGHT HIP WITHOUT CONTRAST TECHNIQUE: Multidetector CT imaging of the right hip was performed according to the standard protocol. Multiplanar CT image reconstructions were also generated. COMPARISON:  Radiographs earlier this day. FINDINGS: Right hip and hemipelvis intact.  No acute fracture. Femoral head is normally located. 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 Mild degenerative change of the right sacroiliac joint. Pubic rami intact. Degenerative change with facet arthropathy and degenerative disc disease in the included lower lumbar spine. Soft tissue evaluation shows no confluent hematoma. No evidence of  right inguinal hernia. Included right lower quadrant bowel loops are normal. Atherosclerosis noted. IMPRESSION: Mild degenerative change and chondrocalcinosis of the right hip joint and pubic symphysis. No acute bony abnormality. No discrete right inguinal hernia. Electronically Signed   By: Jeb Levering M.D.   On: 11/10/2015 20:01   Dg Hip Unilat With Pelvis 2-3 Views Right  11/10/2015  CLINICAL DATA:  Right groin pain starting yesterday, no known injury EXAM: DG HIP (WITH OR WITHOUT PELVIS) 2-3V RIGHT COMPARISON:  None. FINDINGS: Three views of the right hip submitted. No acute fracture or subluxation. Mild degenerative changes bilateral hip joints with mild superior acetabular spurring. Atherosclerotic calcifications of femoral artery bilaterally. Mild degenerative changes pubic symphysis. Mild spurring of the right femoral head. IMPRESSION: No acute fracture or subluxation. Mild degenerative changes as described above. Electronically Signed   By: Lahoma Crocker M.D.   On: 11/10/2015 18:26   I have personally reviewed and evaluated these images and lab results as part of my medical decision-making.   EKG Interpretation None     No indication of hip fracture. No inguinal hernia. Pain seems to muscular in origin.  Patient discussed with and seen by Dr. Rogene Houston.  MDM   Final diagnoses:  None    Musculoskeletal groin pain. Care instructions provided. Return precautions discussed. Follow-up with PCP.    Etta Quill, NP 11/11/15 [phone]

## 2015-11-10 NOTE — ED Provider Notes (Signed)
Medical screening examination/treatment/procedure(s) were conducted as a shared visit with non-physician practitioner(s) and myself.  I personally evaluated the patient during the encounter.   EKG Interpretation None      Results for orders placed or performed in visit on 08/25/15  TSH  Result Value Ref Range   TSH 0.80 0.35 - 4.50 uIU/mL  Lipid panel  Result Value Ref Range   Cholesterol 192 0 - 200 mg/dL   Triglycerides 60.0 0.0 - 149.0 mg/dL   HDL 85.40 >39.00 mg/dL   VLDL 12.0 0.0 - 40.0 mg/dL   LDL Cholesterol 95 0 - 99 mg/dL   Total CHOL/HDL Ratio 2    NonHDL 106.50   Comprehensive metabolic panel  Result Value Ref Range   Sodium 142 135 - 145 mEq/L   Potassium 4.4 3.5 - 5.1 mEq/L   Chloride 103 96 - 112 mEq/L   CO2 34 (H) 19 - 32 mEq/L   Glucose, Bld 88 70 - 99 mg/dL   BUN 17 6 - 23 mg/dL   Creatinine, Ser 1.09 0.40 - 1.50 mg/dL   Total Bilirubin 0.6 0.2 - 1.2 mg/dL   Alkaline Phosphatase 69 39 - 117 U/L   AST 17 0 - 37 U/L   ALT 9 0 - 53 U/L   Total Protein 6.7 6.0 - 8.3 g/dL   Albumin 3.7 3.5 - 5.2 g/dL   Calcium 9.5 8.4 - 10.5 mg/dL   GFR 83.20 >60.00 mL/min  CBC with Differential/Platelet  Result Value Ref Range   WBC 5.4 4.0 - 10.5 K/uL   RBC 4.60 4.22 - 5.81 Mil/uL   Hemoglobin 13.4 13.0 - 17.0 g/dL   HCT 40.8 39.0 - 52.0 %   MCV 88.8 78.0 - 100.0 fl   MCHC 32.8 30.0 - 36.0 g/dL   RDW 14.4 11.5 - 15.5 %   Platelets 198.0 150.0 - 400.0 K/uL   Neutrophils Relative % 60.5 43.0 - 77.0 %   Lymphocytes Relative 28.0 12.0 - 46.0 %   Monocytes Relative 10.4 3.0 - 12.0 %   Eosinophils Relative 0.7 0.0 - 5.0 %   Basophils Relative 0.4 0.0 - 3.0 %   Neutro Abs 3.2 1.4 - 7.7 K/uL   Lymphs Abs 1.5 0.7 - 4.0 K/uL   Monocytes Absolute 0.6 0.1 - 1.0 K/uL   Eosinophils Absolute 0.0 0.0 - 0.7 K/uL   Basophils Absolute 0.0 0.0 - 0.1 K/uL   Ct Hip Right Wo Contrast  11/10/2015  CLINICAL DATA:  Right groin pain. Difficulty with ambulation. Patient states history of  hernia. EXAM: CT OF THE RIGHT HIP WITHOUT CONTRAST TECHNIQUE: Multidetector CT imaging of the right hip was performed according to the standard protocol. Multiplanar CT image reconstructions were also generated. COMPARISON:  Radiographs earlier this day. FINDINGS: Right hip and hemipelvis intact. No acute fracture. Femoral head is normally located. 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 Mild degenerative change of the right sacroiliac joint. Pubic rami intact. Degenerative change with facet arthropathy and degenerative disc disease in the included lower lumbar spine. Soft tissue evaluation shows no confluent hematoma. No evidence of right inguinal hernia. Included right lower quadrant bowel loops are normal. Atherosclerosis noted. IMPRESSION: Mild degenerative change and chondrocalcinosis of the right hip joint and pubic symphysis. No acute bony abnormality. No discrete right inguinal hernia. Electronically Signed   By: Jeb Levering M.D.   On: 11/10/2015 20:01   Dg Hip Unilat With Pelvis 2-3 Views Right  11/10/2015  CLINICAL  DATA:  Right groin pain starting yesterday, no known injury EXAM: DG HIP (WITH OR WITHOUT PELVIS) 2-3V RIGHT COMPARISON:  None. FINDINGS: Three views of the right hip submitted. No acute fracture or subluxation. Mild degenerative changes bilateral hip joints with mild superior acetabular spurring. Atherosclerotic calcifications of femoral artery bilaterally. Mild degenerative changes pubic symphysis. Mild spurring of the right femoral head. IMPRESSION: No acute fracture or subluxation. Mild degenerative changes as described above. Electronically Signed   By: Lahoma Crocker M.D.   On: 11/10/2015 18:26    Patient with acute onset of right groin hip area pain. No history of any falls. Pain is worse with movement. X-rays of the area without any bony abnormalities. CT scan of the right hip without evidence of any bony abnormality. So no evidence of  fracture. Patient's symptoms are consistent with a groin muscle type strain and will be treated as such. Patient also gives a 5 month history of a right groin bulge that comes and goes only present when he standing. On examination here today no evidence of a groin hernia and no tenderness in the scrotal area inguinal canal. Patient does have increased pain with movement of the right leg. Patient has follow-up with his primary care doctor at the [facility] on January 5. Patient will be treated with tramadol. Patient stable for discharge home.  Fredia Sorrow, MD 11/10/15 2032

## 2015-11-10 NOTE — Telephone Encounter (Signed)
Patient went to ER>

## 2015-11-10 NOTE — ED Notes (Signed)
Pt sts he has a hernia on right side of groin area, sts hes had it for 5 months, last night he sts he had a flare up and it hurts to walk, extreme pain, denies vomiting, denies fever

## 2015-11-10 NOTE — ED Notes (Signed)
Patient c/o right sided groin pain radiating to right leg x1 day. Describes pain as "feels like someone is sticking me with needles".  Rates pain 8/10. Tylenol 1000mg  approximately 1300 today.

## 2015-11-10 NOTE — Telephone Encounter (Signed)
Patient Name: JOSEDANIEL HIDAY  DOB: [birth year]    Initial Comment Caller states father is having excrutiating pain in RT groin area;    Nurse Assessment  Nurse: Mallie Mussel, RN, Alveta Heimlich Date/Time Eilene Ghazi Time): 11/10/2015 1:09:19 PM  Confirm and document reason for call. If symptomatic, describe symptoms. ---Caller states that his father has excruciating pain in his right groin area which began last night. Denies any bulging and not into his testicles. He rates his pain as 8 on 0-10 scale. He is having to use a walker to get around now. Denies vomiting. Denies fever.  Has the patient traveled out of the country within the last 30 days? ---No  Does the patient have any new or worsening symptoms? ---Yes  Will a triage be completed? ---Yes  Related visit to physician within the last 2 weeks? ---No  Does the PT have any chronic conditions? (i.e. diabetes, asthma, etc.) ---No  Is this a behavioral health or substance abuse call? ---No     Guidelines    Guideline Title Affirmed Question Affirmed Notes  Hernia Severe abdominal pain    Final Disposition User   Go to ED Now Mallie Mussel, RN, [facility] - ED   Disagree/Comply: Comply

## 2015-11-10 NOTE — Telephone Encounter (Signed)
FYI

## 2016-04-11 ENCOUNTER — Encounter: Payer: Self-pay | Admitting: Internal Medicine

## 2016-04-11 ENCOUNTER — Ambulatory Visit (INDEPENDENT_AMBULATORY_CARE_PROVIDER_SITE_OTHER): Payer: Medicare Other | Admitting: Internal Medicine

## 2016-04-11 VITALS — BP 110/64 | HR 63 | Temp 98.3°F | Resp 198 | Ht 70.5 in | Wt 163.0 lb

## 2016-04-11 DIAGNOSIS — B9789 Other viral agents as the cause of diseases classified elsewhere: Secondary | ICD-10-CM

## 2016-04-11 DIAGNOSIS — J069 Acute upper respiratory infection, unspecified: Secondary | ICD-10-CM | POA: Diagnosis not present

## 2016-04-11 DIAGNOSIS — I2699 Other pulmonary embolism without acute cor pulmonale: Secondary | ICD-10-CM | POA: Diagnosis not present

## 2016-04-11 NOTE — Progress Notes (Signed)
Pre visit review using our clinic review tool, if applicable. No additional management support is needed unless otherwise documented below in the visit note. 

## 2016-04-11 NOTE — Progress Notes (Signed)
Subjective:    Patient ID: Connor Neal, male    DOB: [birth year], 82 y.o.   MRN: [id number]  HPI 82 year old patient who is seen today with 2 concerns.  Following considerable yard work about one month ago he had pain and swelling involving his left hand and wrist.  This was primarily over the dorsal aspect of the left hand.  More recently the swelling has resolved and he feels the hand is basically back to baseline.  For the past 2 weeks, he has also noted some dry nonproductive cough.  There is been no fever, shortness of breath or sputum production.  He also feels this has largely resolved. He remains on anticoagulation.  Denies any hemoptysis. No shortness of breath.  O2 saturation today 98  Past Medical History  Diagnosis Date  . ABDOMINAL PAIN 11/14/2008  . BENIGN PROSTATIC HYPERTROPHY 02/13/2008  . COLONIC POLYPS, HX OF 08/14/2008  . COLONIC POLYPS 01/04/2008  . Diverticulosis of colon (without mention of hemorrhage) 01/04/2008  . GERD 11/17/2010  . Internal hemorrhoids with other complication [id number]  . NECK PAIN 06/30/2010  . Other symptoms involving cardiovascular system 07/01/2010  . PROSTATITIS, RECURRENT 01/04/2008  . PVD 08/17/2010  . TOBACCO USE, QUIT 08/14/2009     Social History   Social History  . Marital Status: Married    Spouse Name: N/A  . Number of Children: N/A  . Years of Education: N/A   Occupational History  . Not on file.   Social History Main Topics  . Smoking status: Former Smoker    Quit date: 11/07/1958  . Smokeless tobacco: Never Used  . Alcohol Use: No  . Drug Use: No  . Sexual Activity: Not on file   Other Topics Concern  . Not on file   Social History Narrative    Past Surgical History  Procedure Laterality Date  . Transurethral resection of prostate      No family history on file.  No Known Allergies  Current Outpatient Prescriptions on File Prior to Visit  Medication Sig Dispense Refill  . acetaminophen (TYLENOL) 325 MG  tablet Take 650 mg by mouth every 6 (six) hours as needed for mild pain or moderate pain.    Marland Kitchen apixaban (ELIQUIS) 2.5 MG TABS tablet Take 2.5 mg by mouth 2 (two) times daily.    . pravastatin (PRAVACHOL) 20 MG tablet Take 1 tablet (20 mg total) by mouth daily. 90 tablet 3  . tamsulosin (FLOMAX) 0.4 MG CAPS capsule Take 0.4 mg by mouth daily.    . traMADol (ULTRAM) 50 MG tablet Take 1 tablet (50 mg total) by mouth every 6 (six) hours as needed. 15 tablet 0   No current facility-administered medications on file prior to visit.    BP 110/64 mmHg  Pulse 63  Temp(Src) 98.3 F (36.8 C) (Oral)  Resp 198  Ht 5' 10.5" (1.791 m)  Wt 163 lb (73.936 kg)  BMI 23.05 kg/m2  SpO2 98%      Review of Systems  Constitutional: Negative for fever, chills, appetite change and fatigue.  HENT: Negative for congestion, dental problem, ear pain, hearing loss, sore throat, tinnitus, trouble swallowing and voice change.   Eyes: Negative for pain, discharge and visual disturbance.  Respiratory: Positive for cough. Negative for chest tightness, wheezing and stridor.   Cardiovascular: Negative for chest pain, palpitations and leg swelling.  Gastrointestinal: Negative for nausea, vomiting, abdominal pain, diarrhea, constipation, blood in stool and abdominal distention.  Genitourinary: Negative for urgency, hematuria,  flank pain, discharge, difficulty urinating and genital sores.  Musculoskeletal: Positive for arthralgias. Negative for myalgias, back pain, joint swelling, gait problem and neck stiffness.  Skin: Negative for rash.  Neurological: Negative for dizziness, syncope, speech difficulty, weakness, numbness and headaches.  Hematological: Negative for adenopathy. Does not bruise/bleed easily.  Psychiatric/Behavioral: Negative for behavioral problems and dysphoric mood. The patient is not nervous/anxious.        Objective:   Physical Exam  Constitutional: He is oriented to person, place, and time. He  appears well-developed.  HENT:  Head: Normocephalic.  Right Ear: External ear normal.  Left Ear: External ear normal.  Eyes: Conjunctivae and EOM are normal.  Neck: Normal range of motion.  Cardiovascular: Normal rate and normal heart sounds.   Pulmonary/Chest: Breath sounds normal. No respiratory distress. He has no wheezes. He has no rales.  O2 saturation 98  Abdominal: Bowel sounds are normal.  Musculoskeletal: Normal range of motion. He exhibits no edema or tenderness.  Left hand appeared normal.  No soft tissue swelling or local tenderness  Neurological: He is alert and oriented to person, place, and time.  Psychiatric: He has a normal mood and affect. His behavior is normal.          Assessment & Plan:   Resolving viral URI with cough Resolving left hand tendinitis History of pulmonary embolism Chronic anticoagulation  Symptoms are larger resolved.  Will observe at this point.  He report any new or worsening symptoms

## 2016-04-12 ENCOUNTER — Encounter: Payer: Self-pay | Admitting: Internal Medicine

## 2016-04-12 NOTE — Patient Instructions (Signed)
Please report any new or worsening symptoms  Return as scheduled for routine follow-up

## 2016-07-01 ENCOUNTER — Other Ambulatory Visit: Payer: Self-pay | Admitting: Internal Medicine

## 2016-07-04 ENCOUNTER — Other Ambulatory Visit: Payer: Self-pay | Admitting: Internal Medicine

## 2016-08-26 ENCOUNTER — Encounter: Payer: Medicare Other | Admitting: Internal Medicine

## 2016-09-14 ENCOUNTER — Encounter: Payer: Self-pay | Admitting: Gastroenterology

## 2017-01-04 ENCOUNTER — Emergency Department (HOSPITAL_COMMUNITY)
Admission: EM | Admit: 2017-01-04 | Discharge: 2017-01-04 | Disposition: A | Discharge status: Home / Self Care | Payer: Medicare Other | Attending: Emergency Medicine | Admitting: Emergency Medicine

## 2017-01-04 ENCOUNTER — Encounter (HOSPITAL_COMMUNITY): Payer: Self-pay

## 2017-01-04 ENCOUNTER — Emergency Department (HOSPITAL_COMMUNITY): Payer: Medicare Other

## 2017-01-04 DIAGNOSIS — Z87891 Personal history of nicotine dependence: Secondary | ICD-10-CM | POA: Insufficient documentation

## 2017-01-04 DIAGNOSIS — R0789 Other chest pain: Secondary | ICD-10-CM | POA: Insufficient documentation

## 2017-01-04 DIAGNOSIS — Z7902 Long term (current) use of antithrombotics/antiplatelets: Secondary | ICD-10-CM | POA: Insufficient documentation

## 2017-01-04 DIAGNOSIS — Z79899 Other long term (current) drug therapy: Secondary | ICD-10-CM | POA: Diagnosis not present

## 2017-01-04 DIAGNOSIS — R079 Chest pain, unspecified: Secondary | ICD-10-CM

## 2017-01-04 HISTORY — DX: Other pulmonary embolism without acute cor pulmonale: I26.99

## 2017-01-04 LAB — BASIC METABOLIC PANEL
ANION GAP: 6 (ref 5–15)
BUN: 22 mg/dL — ABNORMAL HIGH (ref 6–20)
CHLORIDE: 107 mmol/L (ref 101–111)
CO2: 26 mmol/L (ref 22–32)
Calcium: 9.2 mg/dL (ref 8.9–10.3)
Creatinine, Ser: 1.07 mg/dL (ref 0.61–1.24)
GFR calc Af Amer: >60 mL/min (ref >60)
GFR calc non Af Amer: >60 mL/min (ref >60)
GLUCOSE: 104 mg/dL — AB (ref 65–99)
Potassium: 3.8 mmol/L (ref 3.5–5.1)
Sodium: 139 mmol/L (ref 135–145)

## 2017-01-04 LAB — CBC
HEMATOCRIT: 38.2 % — AB (ref 39.0–52.0)
HEMOGLOBIN: 12.3 g/dL — AB (ref 13.0–17.0)
MCH: 28.5 pg (ref 26.0–34.0)
MCHC: 32.2 g/dL (ref 30.0–36.0)
MCV: 88.6 fL (ref 78.0–100.0)
Platelets: 204 10*3/uL (ref 150–400)
RBC: 4.31 MIL/uL (ref 4.22–5.81)
RDW: 13.9 % (ref 11.5–15.5)
WBC: 4.7 10*3/uL (ref 4.0–10.5)

## 2017-01-04 LAB — I-STAT TROPONIN, ED: Troponin i, poc: 0 ng/mL (ref 0.00–0.08)

## 2017-01-04 NOTE — ED Notes (Signed)
Back from xray

## 2017-01-04 NOTE — ED Triage Notes (Signed)
Pt was working in garden yesterday when he started having nonradiating central chest pain. Pt denies n/v/diaphoresis. Pt states he did feel sob when laying down.   Pt received 324 ASA and 2 SL NTG. Pt states ntg brought pain from 8 to a 6.

## 2017-01-04 NOTE — ED Provider Notes (Signed)
[facility] DEPT Provider Note   CSN: [id number] Arrival date & time: 01/04/17  0459     History   Chief Complaint Chief Complaint  Patient presents with  . Chest Pain    HPI Connor Neal is a 83 y.o. male.  HPI  83 y.o. male, hx PE on Eliquis, presents to the Emergency Department today complaining of chest pain with onset yesterday while working in the garden. Described as non radiating central CP that he states occurred while resting. Notes positional discomfort with sitting up, but relieved with lying down. Describes no pain currently. States that the chest pain is intermittent. Pt brought in by EMS and was given 324 ASA as well as 2 SL NTG with improvement of symptoms from 8 to 6/10. No N/V. No diaphoresis. No SOB/ABD pain. Pt is part of the [facility], states that he has a recent stress test in January from Cardiologist that was negative as well as an echocardiogram. Pt was diagnosed with PE in 2016 and started on Eliquis. No fevers. No URI symptoms. No other symptoms noted.    Past Medical History:  Diagnosis Date  . ABDOMINAL PAIN 11/14/2008  . BENIGN PROSTATIC HYPERTROPHY 02/13/2008  . COLONIC POLYPS 01/04/2008  . COLONIC POLYPS, HX OF 08/14/2008  . Diverticulosis of colon (without mention of hemorrhage) 01/04/2008  . GERD 11/17/2010  . Internal hemorrhoids with other complication 99991111  . NECK PAIN 06/30/2010  . Other symptoms involving cardiovascular system 07/01/2010  . PROSTATITIS, RECURRENT 01/04/2008  . PVD 08/17/2010  . TOBACCO USE, QUIT 08/14/2009    Patient Active Problem List   Diagnosis Date Noted  . 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 Dyslipidemia 08/22/2012  . GERD 11/17/2010  . PVD 08/17/2010  . OTHER SYMPTOMS INVOLVING CARDIOVASCULAR SYSTEM 07/01/2010  . NECK PAIN 06/30/2010  . TOBACCO USE, QUIT 08/14/2009  . ABDOMINAL PAIN 11/14/2008  . COLONIC POLYPS, HX OF 08/14/2008  . BENIGN PROSTATIC HYPERTROPHY 02/13/2008  . INTERNAL  HEMORRHOIDS WITH OTHER COMPLICATION 123XX123  . Diverticulosis of colon (without mention of hemorrhage) 01/04/2008  . PROSTATITIS, RECURRENT 01/04/2008    Past Surgical History:  Procedure Laterality Date  . TRANSURETHRAL RESECTION OF PROSTATE         Home Medications    Prior to Admission medications   Medication Sig Start Date End Date Taking? Authorizing Provider  acetaminophen (TYLENOL) 325 MG tablet Take 650 mg by mouth every 4 (four) hours as needed for mild pain or moderate pain.    Yes Historical Provider, MD  apixaban (ELIQUIS) 2.5 MG TABS tablet Take 2.5 mg by mouth 2 (two) times daily.   Yes Historical Provider, MD  tamsulosin (FLOMAX) 0.4 MG CAPS capsule TAKE ONE CAPSULE BY MOUTH EVERY DAY 07/01/16  Yes Marletta Lor, MD  pravastatin (PRAVACHOL) 20 MG tablet Take 1 tablet (20 mg total) by mouth daily. Patient not taking: Reported on 01/04/2017 08/25/15   Marletta Lor, MD  traMADol (ULTRAM) 50 MG tablet Take 1 tablet (50 mg total) by mouth every 6 (six) hours as needed. Patient not taking: Reported on 01/04/2017 11/10/15   Etta Quill, NP    Family History No family history on file.  Social History Social History  Substance Use Topics  . Smoking status: Former Smoker    Quit date: 11/07/1958  . Smokeless tobacco: Never Used  . Alcohol use No     Allergies   Patient has no known allergies.   Review of Systems Review of Systems ROS  reviewed and all are negative for acute change except as noted in the HPI.  Physical Exam Updated Vital Signs BP 175/89 (BP Location: Right Arm)   Pulse 72   Temp 98.7 F (37.1 C) (Oral)   Resp 15   Ht 5\' 11"  (1.803 m)   Wt 74.8 kg   SpO2 98%   BMI 23.01 kg/m   Physical Exam  Constitutional: He is oriented to person, place, and time. Vital signs are normal. He appears well-developed and well-nourished.  HENT:  Head: Normocephalic and atraumatic.  Right Ear: Hearing normal.  Left Ear: Hearing normal.  Eyes:  Conjunctivae and EOM are normal. Pupils are equal, round, and reactive to light.  Neck: Normal range of motion. Neck supple.  Cardiovascular: Normal rate, regular rhythm, normal heart sounds and intact distal pulses.   Pulmonary/Chest: Effort normal and breath sounds normal. No respiratory distress. He has no decreased breath sounds. He has no wheezes. He has no rhonchi. He has no rales.  Abdominal: Soft. There is no tenderness.  Musculoskeletal: Normal range of motion.  Neurological: He is alert and oriented to person, place, and time.  Skin: Skin is warm and dry.  Psychiatric: He has a normal mood and affect. His speech is normal and behavior is normal. Thought content normal.  Nursing note and vitals reviewed.  ED Treatments / Results  Labs (all labs ordered are listed, but only abnormal results are displayed) Labs Reviewed  CBC - Abnormal; Notable for the following:       Result Value   Hemoglobin 12.3 (*)    HCT 38.2 (*)    All other components within normal limits  BASIC METABOLIC PANEL  I-STAT TROPOININ, ED    EKG  EKG Interpretation  Date/Time:  Wednesday January 04 2017 05:08:07 EST Ventricular Rate:  72 PR Interval:    QRS Duration: 78 QT Interval:  394 QTC Calculation: 432 R Axis:   62 Text Interpretation:  Sinus rhythm Atrial premature complex Prolonged PR interval Minimal ST elevation, anterior leads Confirmed by Christy Gentles  MD, DONALD ([id number]) on 01/04/2017 5:44:17 AM       Radiology Dg Chest 2 View  Result Date: 01/04/2017 CLINICAL DATA:  Acute onset of generalized chest pain. Initial encounter. EXAM: CHEST  2 VIEW COMPARISON:  Chest radiograph performed 04/15/2015 FINDINGS: The lungs are well-aerated. Scarring is noted at the right lung apex. There is no evidence of focal opacification, pleural effusion or pneumothorax. The heart is normal in size; the mediastinal contour is within normal limits. No acute osseous abnormalities are seen. IMPRESSION: Scarring at  the right lung apex.  Lungs otherwise grossly clear. Electronically Signed   By: Garald Balding M.D.   On: 01/04/2017 05:33    Procedures Procedures (including critical care time)  Medications Ordered in ED Medications - No data to display   Initial Impression / Assessment and Plan / ED Course  I have reviewed the triage vital signs and the nursing notes.  Pertinent labs & imaging results that were available during my care of the patient were reviewed by me and considered in my medical decision making (see chart for details).  Final Clinical Impressions(s) / ED Diagnoses  I have reviewed and evaluated the relevant laboratory values. I have reviewed and evaluated the relevant imaging studies. I have interpreted the relevant EKG. I have reviewed the relevant previous healthcare records. I have reviewed EMS Documentation. I obtained HPI from historian. Patient discussed with supervising physician.  ED Course:  Assessment: Pt  is a 83 y.o. male presents with CP with onset x 2 days ago. Occurred at rest. No N/V. No diaphoresis. Notes hx same in past. Recent stress and Echo by [facility] in January with unremarkable results. No hx CAD. Does have hx PE, but patient is on Eliquis. Patient is to be discharged with recommendation to follow up with PCP in regards to today's hospital visit. Chest pain is not likely of cardiac or pulmonary etiology d/t presentation, VSS, no tracheal deviation, no JVD or new murmur, RRR, breath sounds equal bilaterally, EKG without acute abnormalities, negative troponin, and negative CXR. Seen by supervising physician as well. Discussed with patient risks and benefits of admission vs DC home with close follow up to PCP. Pt opting to go home for further evaluation with [place] cardiologist. Pt will call this morning for follow up. Given strict return precautions. Pt has been advised to return to the ED is CP becomes exertional, associated with diaphoresis or nausea, radiates to left  jaw/arm, worsens or becomes concerning in any way. Pt appears reliable for follow up and is agreeable to discharge. Patient is in no acute distress. Vital Signs are stable. Patient is able to ambulate. Patient able to tolerate PO.   Disposition/Plan:  DC Home Additional Verbal discharge instructions given and discussed with patient.  Pt Instructed to f/u with Cardiologist at [facility] in [place] in the next week for evaluation and treatment of symptoms. Return precautions given Pt acknowledges and agrees with plan  Supervising Physician Ripley Fraise, MD  Final diagnoses:  Chest pain, unspecified type    New Prescriptions New Prescriptions   No medications on file       Shary Decamp, PA-C 01/04/17 [id number]    Ripley Fraise, MD 01/04/17 [phone]

## 2017-01-04 NOTE — Discharge Instructions (Signed)
Please read and follow all provided instructions.  Your diagnoses today include:  1. Chest pain, unspecified type     Tests performed today include: An EKG of your heart A chest x-ray Cardiac enzymes - a blood test for heart muscle damage Blood counts and electrolytes Vital signs. See below for your results today.   Medications prescribed:   Take any prescribed medications only as directed.  Follow-up instructions: Please follow-up with your Cardiologist at the [facility] as soon as you can for further evaluation of your symptoms.   Return instructions:  SEEK IMMEDIATE MEDICAL ATTENTION IF: You have severe chest pain, especially if the pain is crushing or pressure-like and spreads to the arms, back, neck, or jaw, or if you have sweating, nausea (feeling sick to your stomach), or shortness of breath. THIS IS AN EMERGENCY. Don't wait to see if the pain will go away. Get medical help at once. Call 911 or 0 (operator). DO NOT drive yourself to the hospital.  Your chest pain gets worse and does not go away with rest.  You have an attack of chest pain lasting longer than usual, despite rest and treatment with the medications your caregiver has prescribed.  You wake from sleep with chest pain or shortness of breath. You feel dizzy or faint. You have chest pain not typical of your usual pain for which you originally saw your caregiver.  You have any other emergent concerns regarding your health.  Additional Information: Chest pain comes from many different causes. Your caregiver has diagnosed you as having chest pain that is not specific for one problem, but does not require admission.  You are at low risk for an acute heart condition or other serious illness.   Your vital signs today were: BP 164/81    Pulse 61    Temp 98.7 F (37.1 C) (Oral)    Resp 19    Ht 5\' 11"  (1.803 m)    Wt 74.8 kg    SpO2 100%    BMI 23.01 kg/m  If your blood pressure (BP) was elevated above 135/85 this visit,  please have this repeated by your doctor within one month. --------------

## 2017-04-06 ENCOUNTER — Other Ambulatory Visit: Payer: Self-pay | Admitting: Internal Medicine

## 2017-10-27 ENCOUNTER — Ambulatory Visit (INDEPENDENT_AMBULATORY_CARE_PROVIDER_SITE_OTHER): Payer: Medicare Other | Admitting: Family Medicine

## 2017-10-27 ENCOUNTER — Encounter: Payer: Self-pay | Admitting: Family Medicine

## 2017-10-27 VITALS — BP 118/64 | HR 64 | Temp 98.7°F | Wt 174.0 lb

## 2017-10-27 DIAGNOSIS — R350 Frequency of micturition: Secondary | ICD-10-CM

## 2017-10-27 DIAGNOSIS — N410 Acute prostatitis: Secondary | ICD-10-CM

## 2017-10-27 LAB — POCT URINALYSIS DIPSTICK
Bilirubin, UA: NEGATIVE
Blood, UA: NEGATIVE
GLUCOSE UA: NEGATIVE
Ketones, UA: NEGATIVE
LEUKOCYTES UA: NEGATIVE
NITRITE UA: NEGATIVE
PROTEIN UA: NEGATIVE
SPEC GRAV UA: 1.02 (ref 1.010–1.025)
Urobilinogen, UA: 0.2 E.U./dL
pH, UA: 6.5 (ref 5.0–8.0)

## 2017-10-27 MED ORDER — DOXYCYCLINE HYCLATE 100 MG PO CAPS: 100.0000 mg | ORAL_CAPSULE | Freq: Two times a day (BID) | ORAL | 0 refills | Status: AC

## 2017-10-27 NOTE — Addendum Note (Signed)
Addended by: Myriam Forehand on: 10/27/2017 01:03 PM   Modules accepted: Orders

## 2017-10-27 NOTE — Progress Notes (Signed)
   Subjective:    Patient ID: Connor Neal, male    DOB: [birth year], 83 y.o.   MRN: [id number]  HPI Here for several months of intermittent burning on urination and increased pressure to urinate.. No fevers or back pain. He had a prostate exam performed a year ago at the [place] clinic.    Review of Systems  Constitutional: Negative.   Respiratory: Negative.   Cardiovascular: Negative.   Gastrointestinal: Negative.   Genitourinary: Positive for dysuria, frequency and urgency. Negative for discharge, flank pain and hematuria.       Objective:   Physical Exam  Constitutional: He appears well-developed and well-nourished.  Cardiovascular: Normal rate, regular rhythm, normal heart sounds and intact distal pulses.  Pulmonary/Chest: Effort normal and breath sounds normal. No respiratory distress. He has no wheezes. He has no rales.  Abdominal: Soft. Bowel sounds are normal. He exhibits no distension and no mass. There is no tenderness. There is no rebound and no guarding.  Genitourinary:  Genitourinary Comments: Prostate is not swollen but is slightly tender          Assessment & Plan:  Prostatitis, treat with Doxycycline. Culture the sample.  Alysia Penna, MD

## 2017-10-28 LAB — URINE CULTURE
MICRO NUMBER: 81439747
RESULT: NO GROWTH
SPECIMEN QUALITY:: ADEQUATE

## 2017-11-25 ENCOUNTER — Other Ambulatory Visit: Payer: Self-pay | Admitting: Internal Medicine

## 2017-12-05 ENCOUNTER — Encounter: Payer: Self-pay | Admitting: Family Medicine

## 2017-12-05 ENCOUNTER — Ambulatory Visit (INDEPENDENT_AMBULATORY_CARE_PROVIDER_SITE_OTHER): Payer: Medicare Other | Admitting: Family Medicine

## 2017-12-05 VITALS — BP 102/64 | HR 67 | Temp 98.4°F | Wt 171.4 lb

## 2017-12-05 DIAGNOSIS — N410 Acute prostatitis: Secondary | ICD-10-CM | POA: Diagnosis not present

## 2017-12-05 MED ORDER — TAMSULOSIN HCL 0.4 MG PO CAPS: 0.4000 mg | ORAL_CAPSULE | Freq: Every day | ORAL | 3 refills | Status: DC

## 2017-12-05 MED ORDER — CIPROFLOXACIN HCL 500 MG PO TABS: 500.0000 mg | ORAL_TABLET | Freq: Two times a day (BID) | ORAL | 0 refills | Status: DC

## 2017-12-05 NOTE — Progress Notes (Signed)
   Subjective:    Patient ID: Connor Neal, male    DOB: [birth year], 84 y.o.   MRN: [id number]  HPI Here for one week of burning on urination. No fever. He was here last month for similar symptoms and he was found to have a prostate infection. The urine culture showed no growth. After taking a course of Doxycycline he felt fine for several weeks.    Review of Systems  Constitutional: Negative.   Respiratory: Negative.   Cardiovascular: Negative.   Gastrointestinal: Negative.   Genitourinary: Positive for dysuria, frequency and urgency.       Objective:   Physical Exam  Constitutional: He appears well-developed and well-nourished.  Cardiovascular: Normal rate, regular rhythm, normal heart sounds and intact distal pulses.  Pulmonary/Chest: Effort normal and breath sounds normal. No respiratory distress. He has no wheezes. He has no rales.          Assessment & Plan:  Recurrent prostatitis. Treat with Cipro for 14 days. Refilled Flomax.  Alysia Penna, MD

## 2017-12-14 ENCOUNTER — Telehealth: Payer: Self-pay | Admitting: Internal Medicine

## 2017-12-14 NOTE — Telephone Encounter (Signed)
Copied from [place]. Topic: General - Other >> Dec 14, 2017 12:20 PM Neva Seat wrote: Tamsulosin  0.4mg   Pt has an appt w/ Dr. Burnice Logan on 12/28/17 for a Rx refill visit.  He needs a refill asap.  [id number] [place], [place] - [address] [place] [address] Phone: [phone] Fax: [phone] Not a 24 hour pharmacy; exact hours not known

## 2017-12-14 NOTE — Telephone Encounter (Signed)
Called pt regarding refill request; pt states that he got a partial refill when he picked up medication; spoke with Marjory Lies at [facility] and he inititially a prescription was there stating that needed an office visit for refills and a 2 weeks supply was given; Marjory Lies additionally states that a new prescription was sent with 3 refills and is ready for pt pickup; information relayed to pt and pt instructed to keep his appointment on 12/28/17 at 1315; pt verbalizes understanding.

## 2017-12-28 ENCOUNTER — Other Ambulatory Visit: Payer: Self-pay | Admitting: Family Medicine

## 2017-12-28 ENCOUNTER — Encounter: Payer: Self-pay | Admitting: Internal Medicine

## 2017-12-28 ENCOUNTER — Ambulatory Visit (INDEPENDENT_AMBULATORY_CARE_PROVIDER_SITE_OTHER): Payer: Medicare Other | Admitting: Internal Medicine

## 2017-12-28 VITALS — BP 102/58 | HR 59 | Temp 98.5°F | Ht 71.0 in | Wt 168.0 lb

## 2017-12-28 DIAGNOSIS — E785 Hyperlipidemia, unspecified: Secondary | ICD-10-CM | POA: Diagnosis not present

## 2017-12-28 DIAGNOSIS — I2699 Other pulmonary embolism without acute cor pulmonale: Secondary | ICD-10-CM

## 2017-12-28 DIAGNOSIS — G629 Polyneuropathy, unspecified: Secondary | ICD-10-CM

## 2017-12-28 DIAGNOSIS — I739 Peripheral vascular disease, unspecified: Secondary | ICD-10-CM

## 2017-12-28 LAB — CBC WITH DIFFERENTIAL/PLATELET
BASOS ABS: 0 10*3/uL (ref 0.0–0.1)
BASOS PCT: 0.3 % (ref 0.0–3.0)
EOS ABS: 0.1 10*3/uL (ref 0.0–0.7)
EOS PCT: 1.4 % (ref 0.0–5.0)
HCT: 38.5 % — ABNORMAL LOW (ref 39.0–52.0)
Hemoglobin: 12.7 g/dL — ABNORMAL LOW (ref 13.0–17.0)
LYMPHS PCT: 26.5 % (ref 12.0–46.0)
Lymphs Abs: 1.3 10*3/uL (ref 0.7–4.0)
MCHC: 33 g/dL (ref 30.0–36.0)
MCV: 88.4 fl (ref 78.0–100.0)
MONO ABS: 0.5 10*3/uL (ref 0.1–1.0)
Monocytes Relative: 11 % (ref 3.0–12.0)
NEUTROS PCT: 60.8 % (ref 43.0–77.0)
Neutro Abs: 3 10*3/uL (ref 1.4–7.7)
PLATELETS: 191 10*3/uL (ref 150.0–400.0)
RBC: 4.36 Mil/uL (ref 4.22–5.81)
RDW: 13.9 % (ref 11.5–15.5)
WBC: 4.9 10*3/uL (ref 4.0–10.5)

## 2017-12-28 LAB — COMPREHENSIVE METABOLIC PANEL
ALBUMIN: 3.4 g/dL — AB (ref 3.5–5.2)
ALK PHOS: 86 U/L (ref 39–117)
ALT: 9 U/L (ref 0–53)
AST: 17 U/L (ref 0–37)
BUN: 21 mg/dL (ref 6–23)
CALCIUM: 9.3 mg/dL (ref 8.4–10.5)
CHLORIDE: 105 meq/L (ref 96–112)
CO2: 32 mEq/L (ref 19–32)
CREATININE: 1.27 mg/dL (ref 0.40–1.50)
GFR: 69.35 mL/min (ref >60.00)
Glucose, Bld: 91 mg/dL (ref 70–99)
POTASSIUM: 4.7 meq/L (ref 3.5–5.1)
SODIUM: 141 meq/L (ref 135–145)
TOTAL PROTEIN: 6.5 g/dL (ref 6.0–8.3)
Total Bilirubin: 0.5 mg/dL (ref 0.2–1.2)

## 2017-12-28 LAB — TSH: TSH: 1.23 u[IU]/mL (ref 0.35–4.50)

## 2017-12-28 LAB — LIPID PANEL
CHOLESTEROL: 172 mg/dL (ref 0–200)
HDL: 65.3 mg/dL (ref >39.00)
LDL CALC: 91 mg/dL (ref 0–99)
NonHDL: 106.39
Total CHOL/HDL Ratio: 3
Triglycerides: 76 mg/dL (ref 0.0–149.0)
VLDL: 15.2 mg/dL (ref 0.0–40.0)

## 2017-12-28 LAB — VITAMIN B12: Vitamin B-12: 289 pg/mL (ref 211–911)

## 2017-12-28 NOTE — Patient Instructions (Signed)
Limit your sodium (Salt) intake  Return in 3 months for your annual exam

## 2017-12-28 NOTE — Progress Notes (Signed)
Subjective:    Patient ID: Connor Neal, male    DOB: 1931/01/18, 84 y.o.   MRN: [id number]  HPI  84 year old patient who is seen today in follow-up.  Patient has been seen recently for acute prostatitis and has responded to oral antibiotics.  He has not been seen by me in over one year.  He does have a prior history of pulmonary embolism and has been on chronic anticoagulation.  He has peripheral vascular disease and a history of GERD.  He has had a prior history of recurrent prostatitis and BPH.  Medical regimen in addition to his anticoagulation includes Flomax and pravastatin for dyslipidemia.  Past Medical History:  Diagnosis Date  . ABDOMINAL PAIN 11/14/2008  . BENIGN PROSTATIC HYPERTROPHY 02/13/2008  . COLONIC POLYPS 01/04/2008  . COLONIC POLYPS, HX OF 08/14/2008  . Diverticulosis of colon (without mention of hemorrhage) 01/04/2008  . GERD 11/17/2010  . Internal hemorrhoids with other complication 02/23/3789  . NECK PAIN 06/30/2010  . Other symptoms involving cardiovascular system 07/01/2010  . PROSTATITIS, RECURRENT 01/04/2008  . Pulmonary embolism ([place])   . PVD 08/17/2010  . TOBACCO USE, QUIT 08/14/2009     Social History   Socioeconomic History  . Marital status: Married    Spouse name: Not on file  . Number of children: Not on file  . Years of education: Not on file  . Highest education level: Not on file  Social Needs  . Financial resource strain: Not on file  . Food insecurity - worry: Not on file  . Food insecurity - inability: Not on file  . Transportation needs - medical: Not on file  . Transportation needs - non-medical: Not on file  Occupational History  . Not on file  Tobacco Use  . Smoking status: Former Smoker    Last attempt to quit: 11/07/1958    Years since quitting: 59.1  . Smokeless tobacco: Never Used  Substance and Sexual Activity  . Alcohol use: No  . Drug use: No  . Sexual activity: Not on file  Other Topics Concern  . Not on file  Social History  Narrative  . Not on file    Past Surgical History:  Procedure Laterality Date  . TRANSURETHRAL RESECTION OF PROSTATE      History reviewed. No pertinent family history.  No Known Allergies  Current Outpatient Medications on File Prior to Visit  Medication Sig Dispense Refill  . acetaminophen (TYLENOL) 325 MG tablet Take 650 mg by mouth every 4 (four) hours as needed for mild pain or moderate pain.     Marland Kitchen apixaban (ELIQUIS) 2.5 MG TABS tablet Take 2.5 mg by mouth 2 (two) times daily.    . ciprofloxacin (CIPRO) 500 MG tablet Take 1 tablet (500 mg total) by mouth 2 (two) times daily. 28 tablet 0  . pravastatin (PRAVACHOL) 20 MG tablet Take 1 tablet (20 mg total) by mouth daily. 90 tablet 3  . tamsulosin (FLOMAX) 0.4 MG CAPS capsule Take 1 capsule (0.4 mg total) by mouth daily. 90 capsule 3  . traMADol (ULTRAM) 50 MG tablet Take 1 tablet (50 mg total) by mouth every 6 (six) hours as needed. 15 tablet 0   No current facility-administered medications on file prior to visit.     BP (!) 102/58 (BP Location: Left Arm, Patient Position: Sitting, Cuff Size: Large)   Pulse (!) 59   Temp 98.5 F (36.9 C) (Oral)   Ht 5\' 11"  (1.803 m)   Wt 168 lb (  76.2 kg)   SpO2 96%   BMI 23.43 kg/m    Review of Systems  Constitutional: Negative for appetite change, chills, fatigue and fever.  HENT: Negative for congestion, dental problem, ear pain, hearing loss, sore throat, tinnitus, trouble swallowing and voice change.   Eyes: Negative for pain, discharge and visual disturbance.  Respiratory: Negative for cough, chest tightness, wheezing and stridor.   Cardiovascular: Negative for chest pain, palpitations and leg swelling.  Gastrointestinal: Negative for abdominal distention, abdominal pain, blood in stool, constipation, diarrhea, nausea and vomiting.  Genitourinary: Negative for difficulty urinating, discharge, flank pain, genital sores, hematuria and urgency.  Musculoskeletal: Negative for  arthralgias, back pain, gait problem, joint swelling, myalgias and neck stiffness.  Skin: Negative for rash.  Neurological: Negative for dizziness, syncope, speech difficulty, weakness, numbness and headaches.  Hematological: Negative for adenopathy. Does not bruise/bleed easily.  Psychiatric/Behavioral: Negative for behavioral problems and dysphoric mood. The patient is not nervous/anxious.        Objective:   Physical Exam  HEENT exam.  Unremarkable Neck no adenopathy Chest clear Cardiovascular normal S1-S2 no murmur Abdomen benign Extremities no edema        Assessment & Plan:   Chronic anticoagulation.  Medications refilled  BPH history of recurrent prostatitis Remote history of pulmonary embolism  dyslipidemia pravastatin refilled History of burning dysesthesia of the feet.  Will check updated labs including B12 level  Schedule CPX 3 months  Jewelz Ricklefs Pilar Plate

## 2017-12-29 NOTE — Telephone Encounter (Signed)
Last OV 12/05/2017  Last refilled 12/05/2017 disp 28 with no refills  Call pt are they not feeling any better?

## 2018-03-27 ENCOUNTER — Encounter: Payer: Medicare Other | Admitting: Internal Medicine

## 2018-12-14 ENCOUNTER — Other Ambulatory Visit: Payer: Self-pay | Admitting: Family Medicine

## 2018-12-24 ENCOUNTER — Other Ambulatory Visit: Payer: Self-pay | Admitting: Family Medicine

## 2018-12-26 ENCOUNTER — Other Ambulatory Visit: Payer: Self-pay | Admitting: Family Medicine

## 2019-03-20 ENCOUNTER — Ambulatory Visit (INDEPENDENT_AMBULATORY_CARE_PROVIDER_SITE_OTHER): Payer: Medicare Other | Admitting: Family Medicine

## 2019-03-20 ENCOUNTER — Encounter: Payer: Self-pay | Admitting: Family Medicine

## 2019-03-20 ENCOUNTER — Other Ambulatory Visit: Payer: Self-pay

## 2019-03-20 VITALS — BP 134/70 | HR 113 | Temp 98.2°F | Ht 71.0 in | Wt 165.6 lb

## 2019-03-20 DIAGNOSIS — H938X1 Other specified disorders of right ear: Secondary | ICD-10-CM | POA: Diagnosis not present

## 2019-03-20 NOTE — Progress Notes (Signed)
  Subjective:     Patient ID: Connor Neal, male   DOB: 01-May-1931, 85 y.o.   MRN: [id number]  HPI Patient was seen with right ear discomfort couple days ago.  He states he was using a Q-tip and the cotton portion broke off into the ear.  He denies any bleeding.  No hearing changes.  He has chronic hearing loss and has hearing aids but does not use these very often.  He came in today basically concern for possible foreign body in the ear.  He did irrigate this himself at home and thinks that he may have gotten the cotton out but is not sure.  Past Medical History:  Diagnosis Date  . ABDOMINAL PAIN 11/14/2008  . BENIGN PROSTATIC HYPERTROPHY 02/13/2008  . COLONIC POLYPS 01/04/2008  . COLONIC POLYPS, HX OF 08/14/2008  . Diverticulosis of colon (without mention of hemorrhage) 01/04/2008  . GERD 11/17/2010  . Internal hemorrhoids with other complication 0/32/1224  . NECK PAIN 06/30/2010  . Other symptoms involving cardiovascular system 07/01/2010  . PROSTATITIS, RECURRENT 01/04/2008  . Pulmonary embolism ([place])   . PVD 08/17/2010  . TOBACCO USE, QUIT 08/14/2009   Past Surgical History:  Procedure Laterality Date  . TRANSURETHRAL RESECTION OF PROSTATE      reports that he quit smoking about 60 years ago. He has never used smokeless tobacco. He reports that he does not drink alcohol or use drugs. family history is not on file. No Known Allergies   Review of Systems  Constitutional: Negative for appetite change and unexpected weight change.  HENT: Positive for hearing loss. Negative for ear discharge.   Cardiovascular: Negative for chest pain.       Objective:   Physical Exam Constitutional:      Appearance: Normal appearance.  HENT:     Right Ear: Tympanic membrane normal.     Left Ear: Tympanic membrane normal.     Ears:     Comments: Both ear canals and eardrums are visualized.  There is no evidence for foreign body in either ear.  No signs of ear trauma involving the ear canal or  tympanic membrane. Cardiovascular:     Rate and Rhythm: Normal rate and regular rhythm.  Pulmonary:     Effort: Pulmonary effort is normal.     Breath sounds: Normal breath sounds.  Neurological:     Mental Status: He is alert.        Assessment:     Seen for right ear discomfort.  Patient was concerned about possible foreign body.  None noted on exam    Plan:     -Reassurance -Cautioned about putting foreign bodies into ear canal   Eulas Post MD [facility]

## 2019-05-16 ENCOUNTER — Other Ambulatory Visit: Payer: Self-pay | Admitting: Family Medicine

## 2019-08-19 ENCOUNTER — Other Ambulatory Visit: Payer: Self-pay

## 2019-08-19 DIAGNOSIS — Z20822 Contact with and (suspected) exposure to covid-19: Secondary | ICD-10-CM

## 2019-08-20 LAB — NOVEL CORONAVIRUS, NAA: SARS-CoV-2, NAA: NOT DETECTED

## 2019-12-11 ENCOUNTER — Other Ambulatory Visit: Payer: Self-pay

## 2019-12-11 ENCOUNTER — Emergency Department (HOSPITAL_COMMUNITY): Payer: Medicare Other

## 2019-12-11 ENCOUNTER — Emergency Department (HOSPITAL_COMMUNITY)
Admission: EM | Admit: 2019-12-11 | Discharge: 2019-12-11 | Disposition: A | Discharge status: Home / Self Care | Payer: Medicare Other | Attending: Emergency Medicine | Admitting: Emergency Medicine

## 2019-12-11 ENCOUNTER — Encounter (HOSPITAL_COMMUNITY): Payer: Self-pay

## 2019-12-11 DIAGNOSIS — Z79899 Other long term (current) drug therapy: Secondary | ICD-10-CM | POA: Insufficient documentation

## 2019-12-11 DIAGNOSIS — R0789 Other chest pain: Secondary | ICD-10-CM | POA: Insufficient documentation

## 2019-12-11 DIAGNOSIS — Z87891 Personal history of nicotine dependence: Secondary | ICD-10-CM | POA: Insufficient documentation

## 2019-12-11 DIAGNOSIS — R0602 Shortness of breath: Secondary | ICD-10-CM | POA: Insufficient documentation

## 2019-12-11 DIAGNOSIS — Z7901 Long term (current) use of anticoagulants: Secondary | ICD-10-CM | POA: Insufficient documentation

## 2019-12-11 DIAGNOSIS — R079 Chest pain, unspecified: Secondary | ICD-10-CM

## 2019-12-11 LAB — CBC
HCT: 41.6 % (ref 39.0–52.0)
Hemoglobin: 13.2 g/dL (ref 13.0–17.0)
MCH: 29 pg (ref 26.0–34.0)
MCHC: 31.7 g/dL (ref 30.0–36.0)
MCV: 91.4 fL (ref 80.0–100.0)
Platelets: 162 10*3/uL (ref 150–400)
RBC: 4.55 MIL/uL (ref 4.22–5.81)
RDW: 13.7 % (ref 11.5–15.5)
WBC: 4.4 10*3/uL (ref 4.0–10.5)
nRBC: 0 % (ref 0.0–0.2)

## 2019-12-11 LAB — TROPONIN I (HIGH SENSITIVITY)
Troponin I (High Sensitivity): 9 ng/L (ref <18)
Troponin I (High Sensitivity): 10 ng/L (ref <18)

## 2019-12-11 LAB — BASIC METABOLIC PANEL
Anion gap: 9 (ref 5–15)
BUN: 18 mg/dL (ref 8–23)
CO2: 27 mmol/L (ref 22–32)
Calcium: 9.6 mg/dL (ref 8.9–10.3)
Chloride: 106 mmol/L (ref 98–111)
Creatinine, Ser: 1.35 mg/dL — ABNORMAL HIGH (ref 0.61–1.24)
GFR calc Af Amer: 55 mL/min — ABNORMAL LOW (ref >60)
GFR calc non Af Amer: 47 mL/min — ABNORMAL LOW (ref >60)
Glucose, Bld: 196 mg/dL — ABNORMAL HIGH (ref 70–99)
Potassium: 4.4 mmol/L (ref 3.5–5.1)
Sodium: 142 mmol/L (ref 135–145)

## 2019-12-11 LAB — BRAIN NATRIURETIC PEPTIDE: B Natriuretic Peptide: 166.1 pg/mL — ABNORMAL HIGH (ref 0.0–100.0)

## 2019-12-11 MED ORDER — SODIUM CHLORIDE 0.9% FLUSH: 3.0000 mL | Freq: Once | INTRAVENOUS | Status: DC

## 2019-12-11 NOTE — Discharge Instructions (Addendum)
Follow-up with the cardiologist provided.  Return here as needed for any worsening in your condition.

## 2019-12-11 NOTE — ED Notes (Signed)
Pt discharge and follow up education provided. Pt verbalizes understanding. Pt is alert and oriented x 4 and ambulatory at discharge.

## 2019-12-11 NOTE — ED Triage Notes (Signed)
Pt got up around during night to go to bathroom and started having chest pain and shortness of breath that lasted 45 minutes, returned to sleep.  No pain since onset.  Pt states "breathing is not back to 100%".  Talking in complete sentences.  Denies cough/cold/V/N/D/loss of taste and smell.  No known sick contacts.

## 2019-12-11 NOTE — ED Provider Notes (Signed)
[facility] EMERGENCY DEPARTMENT Provider Note   CSN: [id number] Arrival date & time: 12/11/19  1207     History Chief Complaint  Patient presents with  . Shortness of Breath  . Chest Pain    Connor Neal is a 86 y.o. male.  HPI Patient presents to the emergency department with chest discomfort that is been intermittent for the last couple of months.  Patient states he had an episode last night that lasted for about 45 minutes.  Chest pain and shortness of breath.  Patient states he was able to go back to sleep.  Patient states that he is not had any issues today.  The patient states that he has not seen his doctor about these issues since he started.  Patient states that he did not take any medications prior to arrival for his symptoms.  The patient denies  headache,blurred vision, neck pain, fever, cough, weakness, numbness, dizziness, anorexia, edema, abdominal pain, nausea, vomiting, diarrhea, rash, back pain, dysuria, hematemesis, bloody stool, near syncope, or syncope.    Past Medical History:  Diagnosis Date  . ABDOMINAL PAIN 11/14/2008  . BENIGN PROSTATIC HYPERTROPHY 02/13/2008  . COLONIC POLYPS 01/04/2008  . COLONIC POLYPS, HX OF 08/14/2008  . Diverticulosis of colon (without mention of hemorrhage) 01/04/2008  . GERD 11/17/2010  . Internal hemorrhoids with other complication 99991111  . NECK PAIN 06/30/2010  . Other symptoms involving cardiovascular system 07/01/2010  . PROSTATITIS, RECURRENT 01/04/2008  . Pulmonary embolism ([place])   . PVD 08/17/2010  . TOBACCO USE, QUIT 08/14/2009    Patient Active Problem List   Diagnosis Date Noted  . 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 Dyslipidemia 08/22/2012  . GERD 11/17/2010  . PVD 08/17/2010  . OTHER SYMPTOMS INVOLVING CARDIOVASCULAR SYSTEM 07/01/2010  . NECK PAIN 06/30/2010  . TOBACCO USE, QUIT 08/14/2009  . ABDOMINAL PAIN 11/14/2008  . COLONIC POLYPS, HX OF 08/14/2008  . BENIGN PROSTATIC  HYPERTROPHY 02/13/2008  . INTERNAL HEMORRHOIDS WITH OTHER COMPLICATION [id number]  . Diverticulosis of colon (without mention of hemorrhage) 01/04/2008  . PROSTATITIS, RECURRENT 01/04/2008    Past Surgical History:  Procedure Laterality Date  . TRANSURETHRAL RESECTION OF PROSTATE         History reviewed. No pertinent family history.  Social History   Tobacco Use  . Smoking status: Former Smoker    Quit date: 11/07/1958    Years since quitting: 61.1  . Smokeless tobacco: Never Used  Substance Use Topics  . Alcohol use: No  . Drug use: No    Home Medications Prior to Admission medications   Medication Sig Start Date End Date Taking? Authorizing Provider  acetaminophen (TYLENOL) 325 MG tablet Take 650 mg by mouth every 4 (four) hours as needed for mild pain or moderate pain.     [provider]  apixaban (ELIQUIS) 2.5 MG TABS tablet Take 2.5 mg by mouth 2 (two) times daily.    [provider]  ciprofloxacin (CIPRO) 500 MG tablet Take 1 tablet (500 mg total) by mouth 2 (two) times daily. Patient not taking: Reported on 03/20/2019 12/05/17   Laurey Morale, MD  pravastatin (PRAVACHOL) 20 MG tablet Take 1 tablet (20 mg total) by mouth daily. 08/25/15   Marletta Lor, MD  tamsulosin (FLOMAX) 0.4 MG CAPS capsule TAKE 1 CAPSULE BY MOUTH EVERY DAY 12/20/18   Laurey Morale, MD  traMADol (ULTRAM) 50 MG tablet Take 1 tablet (50 mg total) by mouth every 6 (six) hours  as needed. 11/10/15   Etta Quill, NP    Allergies    Patient has no known allergies.  Review of Systems   Review of Systems All other systems negative except as documented in the HPI. All pertinent positives and negatives as reviewed in the HPI. Physical Exam Updated Vital Signs BP 135/73   Pulse 63   Temp 98.4 F (36.9 C) (Oral)   Resp 20   SpO2 98%   Physical Exam Vitals and nursing note reviewed.  Constitutional:      General: He is not in acute distress.    Appearance: He is  well-developed.  HENT:     Head: Normocephalic and atraumatic.  Eyes:     Pupils: Pupils are equal, round, and reactive to light.  Cardiovascular:     Rate and Rhythm: Normal rate and regular rhythm.     Heart sounds: Normal heart sounds. No murmur. No friction rub. No gallop.   Pulmonary:     Effort: Pulmonary effort is normal. No respiratory distress.     Breath sounds: Normal breath sounds. No wheezing or rhonchi.  Abdominal:     General: Bowel sounds are normal. There is no distension.     Palpations: Abdomen is soft.     Tenderness: There is no abdominal tenderness.  Musculoskeletal:     Cervical back: Normal range of motion and neck supple.  Skin:    General: Skin is warm and dry.     Capillary Refill: Capillary refill takes less than 2 seconds.     Findings: No erythema or rash.  Neurological:     Mental Status: He is alert and oriented to person, place, and time.     Motor: No abnormal muscle tone.     Coordination: Coordination normal.  Psychiatric:        Behavior: Behavior normal.     ED Results / Procedures / Treatments   Labs (all labs ordered are listed, but only abnormal results are displayed) Labs Reviewed  BASIC METABOLIC PANEL - Abnormal; Notable for the following components:      Result Value   Glucose, Bld 196 (*)    Creatinine, Ser 1.35 (*)    GFR calc non Af Amer 47 (*)    GFR calc Af Amer 55 (*)    All other components within normal limits  CBC  BRAIN NATRIURETIC PEPTIDE  TROPONIN I (HIGH SENSITIVITY)  TROPONIN I (HIGH SENSITIVITY)    EKG EKG Interpretation  Date/Time:  Wednesday December 11 2019 12:10:30 EST Ventricular Rate:  71 PR Interval:    QRS Duration: 78 QT Interval:  360 QTC Calculation: 391 R Axis:   59 Text Interpretation: Sinus rhythm with 1st degree A-V block no acute ST/T changes no significant change since 2018 Confirmed by Sherwood Gambler [phone]) on 12/11/2019 1:58:51 PM   Radiology DG Chest 2 View  Result Date:  12/11/2019 CLINICAL DATA:  Chest pain and short of breath for 1 day, history of tobacco abuse EXAM: CHEST - 2 VIEW COMPARISON:  01/04/2017 FINDINGS: Frontal and lateral views of the chest demonstrate a stable cardiac silhouette. Bilateral emphysema again noted, with biapical pleural and parenchymal scarring. Mild hyperinflation. No airspace disease, effusion, or pneumothorax. No acute bony abnormality. IMPRESSION: 1.  Emphysema (ICD10-J43.9). 2. No acute intrathoracic process. Electronically Signed   By: Randa Ngo M.D.   On: 12/11/2019 13:08    Procedures Procedures (including critical care time)  Medications Ordered in ED Medications  sodium chloride flush (NS) 0.9 %  injection 3 mL (has no administration in time range)    ED Course  I have reviewed the triage vital signs and the nursing notes.  Pertinent labs & imaging results that were available during my care of the patient were reviewed by me and considered in my medical decision making (see chart for details).    MDM Rules/Calculators/A&P                      The patient will be referred to cardiology.  He is not have any current pain today or during his visit here in the emergency department.  Did advise patient that he will need to follow-up closely with his doctor as well.  The patient agrees to this plan and all questions were answered.  Patient has negative troponins here in the emergency department.  The chest x-ray does not show any acute abnormalities.  Patient is advised to return here for any worsening in his condition. Final Clinical Impression(s) / ED Diagnoses Final diagnoses:  None    Rx / DC Orders ED Discharge Orders    None       Dalia Heading, PA-C 12/11/19 [place], MD 12/12/19 [phone]

## 2019-12-16 ENCOUNTER — Encounter: Payer: Self-pay | Admitting: Cardiology

## 2019-12-16 NOTE — Progress Notes (Signed)
Referring-[name], PA-C Reason for referral-chest pain  HPI: 86 year old male for evaluation of chest pain at request of Dalia Heading PA-C. Carotid Dopplers October 2013 showed occluded right and 40 to 59% left stenosis. Nuclear study October 2013 showed ejection fraction 62% and no ischemia.  Patient seen in the emergency room on February 3 with complaints of atypical chest pain. Chest x-ray showed emphysema but no other disease noted. Troponins normal.  BNP 166, hemoglobin 13.2 and creatinine 1.35.  Patient has had intermittent chest pain for 4 to 5 years.  He states he had a stress test at the [facility] approximately 3 to 4 years ago.  I do not have those records available.  His pain occurs approximately every 1 to 2 months.  It is in the left breast area and described as a sharp pain lasting approximately 1 minute without radiation.  No associated symptoms.  Not exertional or pleuritic.  Resolves spontaneously.  He otherwise denies dyspnea on exertion, orthopnea, PND, pedal edema, exertional chest pain or syncope.  Current Outpatient Medications  Medication Sig Dispense Refill  . acetaminophen (TYLENOL) 325 MG tablet Take 650 mg by mouth every 4 (four) hours as needed for mild pain or moderate pain.     Marland Kitchen apixaban (ELIQUIS) 2.5 MG TABS tablet Take 2.5 mg by mouth 2 (two) times daily.    . pravastatin (PRAVACHOL) 20 MG tablet Take 1 tablet (20 mg total) by mouth daily. 90 tablet 3  . tamsulosin (FLOMAX) 0.4 MG CAPS capsule TAKE 1 CAPSULE BY MOUTH EVERY DAY 30 capsule 0  . traMADol (ULTRAM) 50 MG tablet Take 1 tablet (50 mg total) by mouth every 6 (six) hours as needed. 15 tablet 0   No current facility-administered medications for this visit.    No Known Allergies   Past Medical History:  Diagnosis Date  . BENIGN PROSTATIC HYPERTROPHY 02/13/2008  . COLONIC POLYPS 01/04/2008  . Diverticulosis of colon (without mention of hemorrhage) 01/04/2008  . GERD 11/17/2010  .  Hyperlipidemia   . Internal hemorrhoids with other complication 99991111  . PROSTATITIS, RECURRENT 01/04/2008  . Pulmonary embolism ([place])   . PVD 08/17/2010  . TOBACCO USE, QUIT 08/14/2009    Past Surgical History:  Procedure Laterality Date  . Right wrist fracture      Social History   Socioeconomic History  . Marital status: Widowed    Spouse name: Not on file  . Number of children: 3  . Years of education: Not on file  . Highest education level: Not on file  Occupational History  . Not on file  Tobacco Use  . Smoking status: Former Smoker    Quit date: 1958    Years since quitting: 63.1  . Smokeless tobacco: Never Used  Substance and Sexual Activity  . Alcohol use: No  . Drug use: No  . Sexual activity: Not on file  Other Topics Concern  . Not on file  Social History Narrative  . Not on file   Social Determinants of Health   Financial Resource Strain:   . Difficulty of Paying Living Expenses: Not on file  Food Insecurity:   . Worried About Charity fundraiser in the Last Year: Not on file  . Ran Out of Food in the Last Year: Not on file  Transportation Needs:   . Lack of Transportation (Medical): Not on file  . Lack of Transportation (Non-Medical): Not on file  Physical Activity:   . Days of Exercise per  Week: Not on file  . Minutes of Exercise per Session: Not on file  Stress:   . Feeling of Stress : Not on file  Social Connections:   . Frequency of Communication with Friends and Family: Not on file  . Frequency of Social Gatherings with Friends and Family: Not on file  . Attends Religious Services: Not on file  . Active Member of Clubs or Organizations: Not on file  . Attends Archivist Meetings: Not on file  . Marital Status: Not on file  Intimate Partner Violence:   . Fear of Current or Ex-Partner: Not on file  . Emotionally Abused: Not on file  . Physically Abused: Not on file  . Sexually Abused: Not on file    Family History   Problem Relation Age of Onset  . CAD Mother   . CAD Sister     ROS: no fevers or chills, productive cough, hemoptysis, dysphasia, odynophagia, melena, hematochezia, dysuria, hematuria, rash, seizure activity, orthopnea, PND, pedal edema, claudication. Remaining systems are negative.  Physical Exam:   Blood pressure 140/70, pulse 71, height 5\' 10"  (1.778 m), weight 161 lb 6.4 oz (73.2 kg), SpO2 98 %.  General:  Well developed/well nourished in NAD Skin warm/dry Patient not depressed No peripheral clubbing Back-normal HEENT-normal/normal eyelids Neck supple/normal carotid upstroke bilaterally; no bruits; no JVD; no thyromegaly chest - CTA/ normal expansion CV - RRR/normal S1 and S2; no murmurs, rubs or gallops;  PMI nondisplaced Abdomen -NT/ND, no HSM, no mass, + bowel sounds, no bruit 2+ femoral pulses, no bruits Ext-no edema, chords, 2+ DP Neuro-grossly nonfocal  ECG -December 11, 2019-sinus rhythm, first-degree AV block, no ST changes.  Personally reviewed  A/P  1 chest pain-symptoms are atypical.  Enzymes were negative and electrocardiogram showed no ST changes.  I have recommended a stress nuclear study for risk stratification.  He would like to have this performed at the [place] due to expense.  They will arrange evaluation there.  2 carotid artery disease-unclear as to when the last carotids were performed.  He will review at the [place] and they will be scheduled if indicated.  Continue statin.  He is not on aspirin given need for apixaban.  3 history of pulmonary embolus-he is on chronic apixaban.  Further management per primary care.  4 hyperlipidemia-continue statin.  Given documented vascular disease would consider high-dose Crestor or Lipitor.  Kirk Ruths, MD

## 2019-12-20 ENCOUNTER — Telehealth: Payer: Self-pay | Admitting: General Practice

## 2019-12-20 NOTE — Telephone Encounter (Signed)
Patient will be a new patient of Dr. [place]ique, he mailed in some paperwork and accidentally included a list from the hospital that he needs. If found please give pt a call to make him aware.   Patient [phone]

## 2019-12-23 ENCOUNTER — Other Ambulatory Visit: Payer: Self-pay

## 2019-12-23 ENCOUNTER — Ambulatory Visit: Payer: Medicare Other | Admitting: Family Medicine

## 2019-12-23 NOTE — Telephone Encounter (Signed)
I haven't seen pt's new patient paperwork yet - but will keep an eye out.

## 2019-12-24 ENCOUNTER — Telehealth: Payer: Self-pay | Admitting: Family Medicine

## 2019-12-25 ENCOUNTER — Encounter: Payer: Self-pay | Admitting: Cardiology

## 2019-12-25 ENCOUNTER — Ambulatory Visit (INDEPENDENT_AMBULATORY_CARE_PROVIDER_SITE_OTHER): Payer: Medicare Other | Admitting: Cardiology

## 2019-12-25 ENCOUNTER — Other Ambulatory Visit: Payer: Self-pay

## 2019-12-25 VITALS — BP 140/70 | HR 71 | Ht 70.0 in | Wt 161.4 lb

## 2019-12-25 DIAGNOSIS — R072 Precordial pain: Secondary | ICD-10-CM

## 2019-12-25 DIAGNOSIS — E785 Hyperlipidemia, unspecified: Secondary | ICD-10-CM | POA: Diagnosis not present

## 2019-12-25 NOTE — Patient Instructions (Signed)
Medication Instructions:  NO CHANGE *If you need a refill on your cardiac medications before your next appointment, please call your pharmacy*  Lab Work: If you have labs (blood work) drawn today and your tests are completely normal, you will receive your results only by: Marland Kitchen MyChart Message (if you have MyChart) OR . A paper copy in the mail If you have any lab test that is abnormal or we need to change your treatment, we will call you to review the results.  Testing/Procedures: Your physician has requested that you have a lexiscan myoview. For further information please visit [email or web address]. Please follow instruction sheet, as given.    Follow-Up: At [facility], you and your health needs are our priority.  As part of our continuing mission to provide you with exceptional heart care, we have created designated Provider Care Teams.  These Care Teams include your primary Cardiologist (physician) and Advanced Practice Providers (APPs -  Physician Assistants and Nurse Practitioners) who all work together to provide you with the care you need, when you need it.  Your next appointment:   AS NEEDED

## 2019-12-30 ENCOUNTER — Telehealth: Payer: Self-pay | Admitting: Cardiology

## 2019-12-30 NOTE — Telephone Encounter (Signed)
Tripp from [place] calling to request the patient's last office notes and any testing or labs faxed to: [phone].

## 2019-12-30 NOTE — Telephone Encounter (Signed)
Received NPP today in the mail. I sent the patients hospital AVS back to the patient with the new appointment reminder.

## 2020-01-09 ENCOUNTER — Telehealth: Payer: Self-pay | Admitting: Cardiology

## 2020-01-09 NOTE — Telephone Encounter (Signed)
Tripp from [place] calling to find out if the patient will be needing any follow up testing since his last office visit.

## 2020-01-09 NOTE — Telephone Encounter (Signed)
Attempted to call Connor Neal with the [place]. No answer, unable to leave a voicemail.

## 2020-01-10 NOTE — Telephone Encounter (Signed)
Attempted to call trip again, no answer. Unable to leave a voicemail.

## 2020-01-16 NOTE — Telephone Encounter (Signed)
Phone just rings unable to leave message Will await a return call from [place]

## 2020-01-16 NOTE — Telephone Encounter (Signed)
disregard

## 2020-03-23 ENCOUNTER — Ambulatory Visit (INDEPENDENT_AMBULATORY_CARE_PROVIDER_SITE_OTHER)
Admission: RE | Admit: 2020-03-23 | Discharge: 2020-03-23 | Disposition: A | Discharge status: Home / Self Care | Payer: Medicare Other | Source: Ambulatory Visit | Attending: Family Medicine | Admitting: Family Medicine

## 2020-03-23 ENCOUNTER — Encounter: Payer: Self-pay | Admitting: Family Medicine

## 2020-03-23 ENCOUNTER — Ambulatory Visit (INDEPENDENT_AMBULATORY_CARE_PROVIDER_SITE_OTHER): Payer: Medicare Other | Admitting: Family Medicine

## 2020-03-23 ENCOUNTER — Other Ambulatory Visit: Payer: Self-pay

## 2020-03-23 VITALS — BP 150/64 | HR 59 | Temp 97.7°F | Resp 16 | Ht 70.0 in | Wt 163.0 lb

## 2020-03-23 DIAGNOSIS — I2699 Other pulmonary embolism without acute cor pulmonale: Secondary | ICD-10-CM | POA: Diagnosis not present

## 2020-03-23 DIAGNOSIS — E785 Hyperlipidemia, unspecified: Secondary | ICD-10-CM

## 2020-03-23 DIAGNOSIS — I499 Cardiac arrhythmia, unspecified: Secondary | ICD-10-CM

## 2020-03-23 DIAGNOSIS — M25562 Pain in left knee: Secondary | ICD-10-CM

## 2020-03-23 DIAGNOSIS — N529 Male erectile dysfunction, unspecified: Secondary | ICD-10-CM

## 2020-03-23 DIAGNOSIS — I739 Peripheral vascular disease, unspecified: Secondary | ICD-10-CM

## 2020-03-23 LAB — COMPREHENSIVE METABOLIC PANEL
ALT: 10 U/L (ref 0–53)
AST: 20 U/L (ref 0–37)
Albumin: 3.9 g/dL (ref 3.5–5.2)
Alkaline Phosphatase: 96 U/L (ref 39–117)
BUN: 25 mg/dL — ABNORMAL HIGH (ref 6–23)
CO2: 28 mEq/L (ref 19–32)
Calcium: 9.1 mg/dL (ref 8.4–10.5)
Chloride: 103 mEq/L (ref 96–112)
Creatinine, Ser: 1.19 mg/dL (ref 0.40–1.50)
GFR: 69.97 mL/min (ref >60.00)
Glucose, Bld: 87 mg/dL (ref 70–99)
Potassium: 4.5 mEq/L (ref 3.5–5.1)
Sodium: 139 mEq/L (ref 135–145)
Total Bilirubin: 0.6 mg/dL (ref 0.2–1.2)
Total Protein: 6.9 g/dL (ref 6.0–8.3)

## 2020-03-23 LAB — LIPID PANEL
Cholesterol: 159 mg/dL (ref 0–200)
HDL: 72.2 mg/dL (ref >39.00)
LDL Cholesterol: 74 mg/dL (ref 0–99)
NonHDL: 86.48
Total CHOL/HDL Ratio: 2
Triglycerides: 63 mg/dL (ref 0.0–149.0)
VLDL: 12.6 mg/dL (ref 0.0–40.0)

## 2020-03-23 MED ORDER — DICLOFENAC SODIUM 1 % EX GEL: 4.0000 g | Freq: Four times a day (QID) | CUTANEOUS | 1 refills | Status: DC

## 2020-03-23 MED ORDER — SILDENAFIL CITRATE 20 MG PO TABS: 20.0000 mg | ORAL_TABLET | Freq: Every day | ORAL | 0 refills | Status: DC | PRN

## 2020-03-23 NOTE — Assessment & Plan Note (Signed)
Continue pravastatin 20 mg daily. LDL goal < 100, ideally < 70. Further recommendation will be given according to lipid panel results.

## 2020-03-23 NOTE — Assessment & Plan Note (Signed)
Carotid artery stenosis. Continue Pravastatin 20 mg daily. He is on Eliquis.

## 2020-03-23 NOTE — Assessment & Plan Note (Signed)
We discussed some side effects of medication, including hypotension (which could be aggravated by also taking Flomax). He can take sildenafil with caution. He was understanding.

## 2020-03-23 NOTE — Progress Notes (Signed)
Chief Complaint  Patient presents with  . Establish Care   HPI: Connor Neal is a 86 y.o. male, who is here today to establish care.  Former PCP: Dr. Burnice Logan Last preventive routine visit: 08/2015.  Chronic medical problems: PVD, dyslipidemia, BPH, and history of PE on anticoagulation among some. PE right lung in 2016, he denies having another thrombotic event. Currently he is on Eliquis 2.5 mg twice daily. BPH, he is on tamsulosin.  0.4 mg daily.  Right PE in 2016, since then he has been on Eliquis No known history of hypertension, today BP mildly elevated.  Also noted mildly irregular HR and heart murmur, he denies known history of valvular disease. Evaluated in the ER on 12/11/2019 because of chest pain.  He followed with Dr. Stanford Breed.  He also sees cardiologist through the [facility], I do not have records at this time.  Denies severe/frequent headache, visual changes, chest pain, dyspnea, palpitation, claudication, focal weakness, or edema. Negative for orthopnea and PND. Negative for decreased urine output, foam in urine, gross hematuria.  Lab Results  Component Value Date   CREATININE 1.35 (H) 12/11/2019   BUN 18 12/11/2019   NA 142 12/11/2019   K 4.4 12/11/2019   CL 106 12/11/2019   CO2 27 12/11/2019   HLD: currently he is on pravastatin 20 mg daily.  Lab Results  Component Value Date   CHOL 172 12/28/2017   HDL 65.30 12/28/2017   LDLCALC 91 12/28/2017   LDLDIRECT 111.0 08/14/2009   TRIG 76.0 12/28/2017   CHOLHDL 3 12/28/2017   PVD: Carotid artery stenosis. The last carotid US I can see was in 08/2012: Chronic occlusion of RICA and 40 to [id number] stenosis of LICA.  Stress test in 08/2012 negative.  Concerns today: Left knee pain. He has had pain before but "not like this." It has been going on for a few days, it seems to be mildly better today after resting all day yesterday. He has not noted erythema.  Sharp,intermittent pain, whole  knee,10/10. It is exacerbated by getting up from seated position. Alleviated by rest. Hx of back OA.  No recent trauma. He took Tylenol PM x 2. He is using a cane.  ED: He has had problem for years. Requesting pharmacologic treatment.  Review of Systems  Constitutional: Negative for activity change, appetite change, fatigue and fever.  HENT: Negative for mouth sores, nosebleeds and sore throat.   Respiratory: Negative for cough and wheezing.   Gastrointestinal: Negative for abdominal pain, nausea and vomiting.  Genitourinary: Negative for discharge, dysuria and genital sores.  Musculoskeletal: Positive for back pain and gait problem.  Skin: Negative for rash and wound.  Neurological: Negative for dizziness, syncope and facial asymmetry.  Psychiatric/Behavioral: Negative for confusion.  Rest see pertinent positives and negatives per HPI.  Current Outpatient Medications on File Prior to Visit  Medication Sig Dispense Refill  . acetaminophen (TYLENOL) 325 MG tablet Take 650 mg by mouth every 4 (four) hours as needed for mild pain or moderate pain.     Marland Kitchen apixaban (ELIQUIS) 2.5 MG TABS tablet Take 2.5 mg by mouth 2 (two) times daily.    . tamsulosin (FLOMAX) 0.4 MG CAPS capsule TAKE 1 CAPSULE BY MOUTH EVERY DAY 30 capsule 0  . pravastatin (PRAVACHOL) 20 MG tablet Take 1 tablet (20 mg total) by mouth daily. 90 tablet 3   No current facility-administered medications on file prior to visit.   Past Medical History:  Diagnosis Date  .  Arthritis   . BENIGN PROSTATIC HYPERTROPHY 02/13/2008  . COLONIC POLYPS 01/04/2008  . Diverticulosis of colon (without mention of hemorrhage) 01/04/2008  . Frequent headaches   . GERD 11/17/2010  . Hyperlipidemia   . Internal hemorrhoids with other complication [id number]  . PROSTATITIS, RECURRENT 01/04/2008  . Pulmonary embolism ([place])   . PVD 08/17/2010  . TOBACCO USE, QUIT 08/14/2009   No Known Allergies  Family History  Problem Relation Age of Onset   . CAD Mother   . CAD Sister     Social History   Socioeconomic History  . Marital status: Single    Spouse name: Not on file  . Number of children: 3  . Years of education: Not on file  . Highest education level: Not on file  Occupational History  . Not on file  Tobacco Use  . Smoking status: Former Smoker    Quit date: 1958    Years since quitting: 63.4  . Smokeless tobacco: Never Used  Substance and Sexual Activity  . Alcohol use: No  . Drug use: No  . Sexual activity: Not on file  Other Topics Concern  . Not on file  Social History Narrative  . Not on file   Social Determinants of Health   Financial Resource Strain:   . Difficulty of Paying Living Expenses:   Food Insecurity:   . Worried About Charity fundraiser in the Last Year:   . Arboriculturist in the Last Year:   Transportation Needs:   . Film/video editor (Medical):   Marland Kitchen Lack of Transportation (Non-Medical):   Physical Activity:   . Days of Exercise per Week:   . Minutes of Exercise per Session:   Stress:   . Feeling of Stress :   Social Connections:   . Frequency of Communication with Friends and Family:   . Frequency of Social Gatherings with Friends and Family:   . Attends Religious Services:   . Active Member of Clubs or Organizations:   . Attends Archivist Meetings:   Marland Kitchen Marital Status:     Vitals:   03/23/20 1038  BP: (!) 150/64  Pulse: (!) 59  Resp: 16  Temp: 97.7 F (36.5 C)  SpO2: 99%   Body mass index is 23.39 kg/m.  Physical Exam  Nursing note and vitals reviewed. Constitutional: He is oriented to person, place, and time. He appears well-developed and well-nourished. No distress.  HENT:  Head: Normocephalic and atraumatic.  Right Ear: Decreased hearing is noted.  Left Ear: Decreased hearing is noted.  Mouth/Throat: Oropharynx is clear and moist and mucous membranes are normal. He has dentures.  Hearing aids bilateral.  Eyes: Pupils are equal, round, and  reactive to light. Conjunctivae are normal.  Cardiovascular: An irregular rhythm present.  Occasional extrasystoles are present. Bradycardia present.  Murmur (SEM II/VI RUSB and apex, also heard in LUSB.) heard. Pulses:      Dorsalis pedis pulses are 2+ on the right side and 2+ on the left side.  Respiratory: Effort normal and breath sounds normal. No respiratory distress.  GI: Soft. He exhibits no mass. There is no hepatomegaly. There is no abdominal tenderness.  Musculoskeletal:        General: No edema.     Left knee: Effusion present. No erythema. Decreased range of motion (Due to edema,). Tenderness (with ROM) present. No medial joint line, lateral joint line or patellar tendon tenderness. Normal patellar mobility.     Comments: Valgus  and varus stress normal,anterior and posterior drawer test negative. Patellar apprehension test negative.  Lymphadenopathy:    He has no cervical adenopathy.  Neurological: He is alert and oriented to person, place, and time. He has normal strength. No cranial nerve deficit.  Antalgic gait assisted with a cane.  Skin: Skin is warm. No rash noted. No erythema.  Psychiatric: He has a normal mood and affect.  Well groomed, good eye contact.   ASSESSMENT AND PLAN:  Mr. Markale was seen today for establish care. Diagnoses and all orders for this visit:  Orders Placed This Encounter  Procedures  . DG Knee Complete 4 Views Left  . Comprehensive metabolic panel  . Lipid panel  . EKG 12-Lead   Lab Results  Component Value Date   CREATININE 1.19 03/23/2020   BUN 25 (H) 03/23/2020   NA 139 03/23/2020   K 4.5 03/23/2020   CL 103 03/23/2020   CO2 28 03/23/2020   Lab Results  Component Value Date   ALT 10 03/23/2020   AST 20 03/23/2020   ALKPHOS 96 03/23/2020   BILITOT 0.6 03/23/2020   Lab Results  Component Value Date   CHOL 159 03/23/2020   HDL 72.20 03/23/2020   LDLCALC 74 03/23/2020   LDLDIRECT 111.0 08/14/2009   TRIG 63.0 03/23/2020    CHOLHDL 2 03/23/2020    Left knee pain, unspecified chronicity We discussed possible etiologies, including OA and bursitis.  After discussing risk procedures, including not been able to complete it and infection, he would like to proceed with aspiration and injection of left knee. In sterile fashion and using a needle 1.5 in gauge 25, no fluid obtain with aspiration, kenalog 40 mg and 2 cc of plain lidocaine 1% injected in joint. He tolerated procedure well. 15 min after procedure her reports mild improvement of pain.  -     diclofenac Sodium (VOLTAREN) 1 % GEL; Apply 4 g topically 4 (four) times daily.  PVD Carotid artery stenosis. Continue Pravastatin 20 mg daily. He is on Eliquis.  Embolism, pulmonary with infarction Reporting one time event, so not sure why he is still on Eliquis. I will review records and make recommendations accordingly, so no changes for now.  ED (erectile dysfunction) We discussed some side effects of medication, including hypotension (which could be aggravated by also taking Flomax). He can take sildenafil with caution. He was understanding.   Dyslipidemia Continue pravastatin 20 mg daily. LDL goal < 100, ideally < 70. Further recommendation will be given according to lipid panel results. -     sildenafil (REVATIO) 20 MG tablet; Take 1-2 tablets (20-40 mg total) by mouth daily as needed.  Irregular heart rhythm EKG today: Sinus bradycardia, first-degree AV block, no signs of acute ischemia, and ?  Voltage criteria for LVH.  Compared with EKG done on 12/12/19, bradycardia is now present. + Heart murmur on auscultation. Currently he is asymptomatic, so I am not ordering further cardiac work-up given the fact he is meeting with his cardiologist next week (04/02/2020). He was clearly instructed about warning signs. I asked him to sign a release form, so I can obtain copies of prior visits as well as imaging done in the past.  Return in about 3 months  (around 06/23/2020).   Takyah Ciaramitaro G. Martinique, MD  [facility]. [place] office.  A few things to remember from today's visit:   Your EKG today is mildly abnormal. Please keep appointment with your cardiologist next week, I would like to  have copy of records. You will notice improvement in knee pain in about 48 to 72 hours.  Today X ray was ordered.  This can be done at [facility] at [facility] between 8 am and 5 pm: [place]. [phone].  Monitor your blood pressure at home.   If you need refills please call your pharmacy. Do not use My Chart to request refills or for acute issues that need immediate attention.    Please be sure medication list is accurate. If a new problem present, please set up appointment sooner than planned today.

## 2020-03-23 NOTE — Assessment & Plan Note (Addendum)
Reporting one time event, so not sure why he is still on Eliquis. I will review records and make recommendations accordingly, so no changes for now.

## 2020-03-23 NOTE — Patient Instructions (Signed)
A few things to remember from today's visit:   Your EKG today is mildly abnormal. Please keep appointment with your cardiologist next week, I would like to have copy of records. You will notice improvement in knee pain in about 48 to 72 hours.  Today X ray was ordered.  This can be done at [facility] at [facility] between 8 am and 5 pm: [place]. [phone].  Monitor your blood pressure at home.   If you need refills please call your pharmacy. Do not use My Chart to request refills or for acute issues that need immediate attention.    Please be sure medication list is accurate. If a new problem present, please set up appointment sooner than planned today.

## 2020-04-20 NOTE — Progress Notes (Signed)
Message sent to Practice Admin for help with coding.

## 2020-04-26 ENCOUNTER — Other Ambulatory Visit: Payer: Self-pay | Admitting: Family Medicine

## 2020-04-26 DIAGNOSIS — N529 Male erectile dysfunction, unspecified: Secondary | ICD-10-CM

## 2020-04-26 MED ORDER — VARDENAFIL HCL 10 MG PO TABS: 10.0000 mg | ORAL_TABLET | Freq: Every day | ORAL | 2 refills | Status: DC | PRN

## 2020-06-24 ENCOUNTER — Encounter: Payer: Self-pay | Admitting: Family Medicine

## 2020-06-24 ENCOUNTER — Ambulatory Visit (INDEPENDENT_AMBULATORY_CARE_PROVIDER_SITE_OTHER): Payer: Medicare Other | Admitting: Family Medicine

## 2020-06-24 ENCOUNTER — Other Ambulatory Visit: Payer: Self-pay

## 2020-06-24 VITALS — BP 138/70 | HR 60 | Resp 16 | Ht 70.0 in | Wt 160.0 lb

## 2020-06-24 DIAGNOSIS — Z0000 Encounter for general adult medical examination without abnormal findings: Secondary | ICD-10-CM

## 2020-06-24 DIAGNOSIS — N529 Male erectile dysfunction, unspecified: Secondary | ICD-10-CM

## 2020-06-24 DIAGNOSIS — I2699 Other pulmonary embolism without acute cor pulmonale: Secondary | ICD-10-CM | POA: Diagnosis not present

## 2020-06-24 DIAGNOSIS — I739 Peripheral vascular disease, unspecified: Secondary | ICD-10-CM

## 2020-06-24 DIAGNOSIS — E785 Hyperlipidemia, unspecified: Secondary | ICD-10-CM

## 2020-06-24 DIAGNOSIS — R351 Nocturia: Secondary | ICD-10-CM

## 2020-06-24 DIAGNOSIS — R079 Chest pain, unspecified: Secondary | ICD-10-CM

## 2020-06-24 DIAGNOSIS — N401 Enlarged prostate with lower urinary tract symptoms: Secondary | ICD-10-CM

## 2020-06-24 NOTE — Progress Notes (Addendum)
  HPI: Connor Neal is a 86 y.o. male, who is here today for CPE and follow up.   He was last seen on 03/23/2020 for knee pain. Knee is better. He is not taking OTC analgesic. He has history of PVD, GERD, history of pulmonary embolism, dyslipidemia, BPH, and ED.  He lives alone. Son and daughter call him regularly. His son takes him to appointments sometimes.  Independent ADLs and IADLs. He follows a healthful diet and walk 3-4 times per week.  He keeps up with house chores,cooks,and drives.  No falls in the past year. No history of depression.  Immunization History  Administered Date(s) Administered  . Influenza Whole 08/14/2008, 08/17/2010  . Influenza, High Dose Seasonal PF 08/06/2015  He thinks he had pneumonia vaccine at [facility].  Last eye exam about 2 years ago. Former smoker.  ED: he discontinue Levitra because side effects. BPH: Currently he is on Flomax 0.4 mg daily. No changes in urinary frequency.  Lab Results  Component Value Date   CREATININE 1.19 03/23/2020   BUN 25 (H) 03/23/2020   NA 139 03/23/2020   K 4.5 03/23/2020   CL 103 03/23/2020   CO2 28 03/23/2020   HLD: He is on Pravastatin 20 mg daily. Lab Results  Component Value Date   CHOL 159 03/23/2020   HDL 72.20 03/23/2020   LDLCALC 74 03/23/2020   LDLDIRECT 111.0 08/14/2009   TRIG 63.0 03/23/2020   CHOLHDL 2 03/23/2020    Concerns today:  "Cool feet." According to patient, he has been told that he does not have a good circulation in lower extremities. He has had intermittent numbness for a few months. Stable. Problem seems to be exacerbated by walking but sometimes also at rest. Sometimes calves pain. He has not noted erythema, cyanosis, or edema. He has not tried OTC medications.  Carotid artery stenosis: Last carotid US in 2013: Chronically occluded RICA, 40-59% LICA stenosis.  Upon further questioning, he reports having intermittent episodes of left-sided chest  pain. Usually pain happens when he is in bed at night. Pain is not radiated. No associated dyspnea, palpitations, or diaphoresis. Negative for orthopnea,edema, and PND.  He has been in the ER and troponin in normal range + EKG with no ischemic changes. He has been evaluated by cardiologist.  He does not have any symptoms when he is exercising.  [facility] this [facility] at [facility]. Negative for history of hypertension, DM 2, or CAD.  He has not noted epigastric abdominal pain, nausea, or heartburn.  Review of Systems  Constitutional: Negative for activity change, appetite change, fatigue and fever.  HENT: Negative for mouth sores, nosebleeds and sore throat.   Eyes: Negative for redness and visual disturbance.  Respiratory: Negative for cough and wheezing.   Cardiovascular: Negative for leg swelling.  Gastrointestinal: Negative for abdominal pain and vomiting.  Endocrine: Negative for cold intolerance, heat intolerance, polydipsia, polyphagia and polyuria.  Genitourinary: Negative for decreased urine volume, dysuria and hematuria.  Musculoskeletal: Positive for arthralgias. Negative for gait problem.  Skin: Negative for rash and wound.  Neurological: Negative for syncope, weakness and headaches.  Psychiatric/Behavioral: Negative for confusion.  All other systems reviewed and are negative.  Current Outpatient Medications on File Prior to Visit  Medication Sig Dispense Refill  . acetaminophen (TYLENOL) 325 MG tablet Take 650 mg by mouth every 4 (four) hours as needed for mild pain or moderate pain.     . apixaban (ELIQUIS)   2.5 MG TABS tablet Take 2.5 mg by mouth 2 (two) times daily.    . diclofenac Sodium (VOLTAREN) 1 % GEL Apply 4 g topically 4 (four) times daily. 150 g 1  . pravastatin (PRAVACHOL) 20 MG tablet Take 1 tablet (20 mg total) by mouth daily. 90 tablet 3  . tamsulosin (FLOMAX) 0.4 MG CAPS capsule TAKE 1 CAPSULE BY MOUTH EVERY DAY 30 capsule 0   . vardenafil (LEVITRA) 10 MG tablet Take 1 tablet (10 mg total) by mouth daily as needed for erectile dysfunction. 15 tablet 2   No current facility-administered medications on file prior to visit.     Past Medical History:  Diagnosis Date  . Arthritis   . BENIGN PROSTATIC HYPERTROPHY 02/13/2008  . COLONIC POLYPS 01/04/2008  . Diverticulosis of colon (without mention of hemorrhage) 01/04/2008  . Frequent headaches   . GERD 11/17/2010  . Hyperlipidemia   . Internal hemorrhoids with other complication 0/34/7425  . PROSTATITIS, RECURRENT 01/04/2008  . Pulmonary embolism ([place])   . PVD 08/17/2010  . TOBACCO USE, QUIT 08/14/2009   No Known Allergies  Social History   Socioeconomic History  . Marital status: Single    Spouse name: Not on file  . Number of children: 3  . Years of education: Not on file  . Highest education level: Not on file  Occupational History  . Not on file  Tobacco Use  . Smoking status: Former Smoker    Quit date: 1958    Years since quitting: 63.6  . Smokeless tobacco: Never Used  Substance and Sexual Activity  . Alcohol use: No  . Drug use: No  . Sexual activity: Not on file  Other Topics Concern  . Not on file  Social History Narrative  . Not on file   Social Determinants of Health   Financial Resource Strain:   . Difficulty of Paying Living Expenses: Not on file  Food Insecurity:   . Worried About Charity fundraiser in the Last Year: Not on file  . Ran Out of Food in the Last Year: Not on file  Transportation Needs:   . Lack of Transportation (Medical): Not on file  . Lack of Transportation (Non-Medical): Not on file  Physical Activity:   . Days of Exercise per Week: Not on file  . Minutes of Exercise per Session: Not on file  Stress:   . Feeling of Stress : Not on file  Social Connections:   . Frequency of Communication with Friends and Family: Not on file  . Frequency of Social Gatherings with Friends and Family: Not on file  . Attends  Religious Services: Not on file  . Active Member of Clubs or Organizations: Not on file  . Attends Archivist Meetings: Not on file  . Marital Status: Not on file    Vitals:   06/24/20 1102  BP: 138/70  Pulse: 60  Resp: 16  SpO2: 97%   Body mass index is 22.96 kg/m.  Physical Exam Vitals and nursing note reviewed.  Constitutional:      General: He is not in acute distress.    Appearance: He is well-developed and normal weight.  HENT:     Head: Normocephalic and atraumatic.     Right Ear: Ear canal and external ear normal. Tympanic membrane is not erythematous.     Left Ear: Ear canal and external ear normal. Tympanic membrane is not erythematous.     Ears:     Comments: TM  partially seen, cerumen in ear canal.    Mouth/Throat:     Mouth: Mucous membranes are moist. No oral lesions.     Pharynx: Oropharynx is clear.  Eyes:     Conjunctiva/sclera: Conjunctivae normal.     Pupils: Pupils are equal, round, and reactive to light.  Cardiovascular:     Rate and Rhythm: Normal rate and regular rhythm.     Heart sounds: Murmur (I/VI SEM RUSB and LUSB) heard.      Comments: DP pulses present. Normal capillary refill. Pulmonary:     Effort: Pulmonary effort is normal. No respiratory distress.     Breath sounds: Normal breath sounds.  Abdominal:     Palpations: Abdomen is soft. There is no hepatomegaly or mass.     Tenderness: There is no abdominal tenderness.  Musculoskeletal:     Right foot: Normal capillary refill. No tenderness or bony tenderness.     Left foot: Normal capillary refill. No tenderness or bony tenderness.  Lymphadenopathy:     Cervical: No cervical adenopathy.  Skin:    General: Skin is warm.     Findings: No erythema or rash.  Neurological:     Mental Status: He is alert and oriented to person, place, and time.     Cranial Nerves: No cranial nerve deficit.     Gait: Gait normal.  Psychiatric:     Comments: Well groomed, good eye contact.    ASSESSMENT AND PLAN:  Mr. Yash Cacciola was seen today for CPE and follow-up.  Diagnoses and all orders for this visit:  Routine general medical examination at a health care facility We discussed the importance of regular physical activity and healthy diet for prevention of chronic illness and/or complications. Preventive guidelines reviewed. Vaccination up to date. Fall precautions. Next CPE in a year.  Embolism, pulmonary with infarction (HCC) Continue Eliquis 2.5 mg bid.  Erectile dysfunction, unspecified erectile dysfunction type He is not interested in pharmacologic treatment for now. Oral treatment options and some side effects discussed.  PVD ? Claudication, peripheral pulses present.  Appropriate foot care. According to pt, he follows with [place] provider, ? cardiologist,at [facility].  Not sure if he is having annual carotid US through the [place]. Continue Pravastatin. He is on Eliquis. Clearly instructed about warning signs.  BPH associated with nocturia Stable. Continue Flomax 0.4 mg daily.  Chest pain, unspecified type Problem seems to be chronic and stable. He has seen Dr Stanford Breed. Stress test in 08/2012 otherwise normal. Instructed about warning signs.  Dyslipidemia Well controlled. Continue Pravastatin 20 mg daily.  Return for AWV schedule.   Kiah Vanalstine G. Martinique, MD  [facility]. [place] office.  Take good care of your feet. Keep appt at [facility]. No changes in your medications today.  You need a medicare visit. A few tips:  -As we age balance is not as good as it was, so there is a higher risks for falls. Please remove small rugs and furniture that is "in your way" and could increase the risk of falls. Stretching exercises may help with fall prevention: Yoga and Tai Chi are some examples. Low impact exercise is better, so you are not very achy the next day.  -Sun screen and avoidance of direct sun light recommended. Caution with  dehydration, if working outdoors be sure to drink enough fluids.  - Some medications are not safe as we age, increases the risk of side effects and can potentially interact with other medication you are also taken;  including some of over the counter medications. Be sure to let me know when you start a new medication even if it is a dietary/vitamin supplement.   -Healthy diet low in red meet/animal fat and sugar + regular physical activity is recommended.                

## 2020-06-24 NOTE — Patient Instructions (Addendum)
Take good care of your feet. Keep appt at [facility]. No changes in your medications today.  You need a medicare visit. A few tips:  -As we age balance is not as good as it was, so there is a higher risks for falls. Please remove small rugs and furniture that is "in your way" and could increase the risk of falls. Stretching exercises may help with fall prevention: Yoga and Tai Chi are some examples. Low impact exercise is better, so you are not very achy the next day.  -Sun screen and avoidance of direct sun light recommended. Caution with dehydration, if working outdoors be sure to drink enough fluids.  - Some medications are not safe as we age, increases the risk of side effects and can potentially interact with other medication you are also taken;  including some of over the counter medications. Be sure to let me know when you start a new medication even if it is a dietary/vitamin supplement.   -Healthy diet low in red meet/animal fat and sugar + regular physical activity is recommended.      

## 2020-06-27 MED ORDER — PRAVASTATIN SODIUM 20 MG PO TABS: 20.0000 mg | ORAL_TABLET | Freq: Every day | ORAL | 3 refills | Status: DC

## 2020-06-27 NOTE — Addendum Note (Signed)
Addended by: Martinique, Miel Wisener G on: 06/27/2020 04:38 PM   Modules accepted: Orders

## 2020-08-05 ENCOUNTER — Other Ambulatory Visit: Payer: Self-pay

## 2020-08-05 ENCOUNTER — Ambulatory Visit (INDEPENDENT_AMBULATORY_CARE_PROVIDER_SITE_OTHER): Payer: Medicare Other | Admitting: Family Medicine

## 2020-08-05 ENCOUNTER — Encounter: Payer: Self-pay | Admitting: Family Medicine

## 2020-08-05 VITALS — BP 122/80 | HR 67 | Resp 16 | Ht 70.0 in | Wt 161.2 lb

## 2020-08-05 DIAGNOSIS — R1030 Lower abdominal pain, unspecified: Secondary | ICD-10-CM

## 2020-08-05 DIAGNOSIS — Z23 Encounter for immunization: Secondary | ICD-10-CM | POA: Diagnosis not present

## 2020-08-05 DIAGNOSIS — I739 Peripheral vascular disease, unspecified: Secondary | ICD-10-CM | POA: Diagnosis not present

## 2020-08-05 DIAGNOSIS — Z0000 Encounter for general adult medical examination without abnormal findings: Secondary | ICD-10-CM | POA: Diagnosis not present

## 2020-08-05 NOTE — Progress Notes (Signed)
HPI: [name] is a 86 y.o. male, who is here today for AWV.  He was last seen on 06/24/2020 for his CPE and follow-up.  He lives alone. Independent ADL's and IADL's. No falls in the past year and denies depression symptoms.  He follows a healthful diet, he cooks. He walks a few times per week.  Functional Status Survey: Is the patient deaf or have difficulty hearing?: Yes (Wears hearing aids) Does the patient have difficulty seeing, even when wearing glasses/contacts?: No Does the patient have difficulty concentrating, remembering, or making decisions?: No Does the patient have difficulty walking or climbing stairs?: No Does the patient have difficulty dressing or bathing?: No Does the patient have difficulty doing errands alone such as visiting a doctor's office or shopping?: No  Fall Risk  08/05/2020 03/23/2020 12/05/2017 08/25/2015 04/10/2015  Falls in the past year? 0 0 No No No  Number falls in past yr: 0 0 - - -  Injury with Fall? 0 0 - - -  Risk for fall due to : Orthopedic patient - - - -   Providers he sees regularly: Cardiologist: Dr Stanford Breed and needed. Vascular at [facility] annually Eye care provider: He has not had eye exam in 2 years.  Depression screen [facility] 2/9 08/05/2020  Decreased Interest 0  Down, Depressed, Hopeless 0  PHQ - 2 Score 0     Mini-Cog - 08/05/20 1302    Normal clock drawing test? no    How many words correct? 3               Hearing Screening   125Hz  250Hz  500Hz  1000Hz  2000Hz  3000Hz  4000Hz  6000Hz  8000Hz   Right ear:           Left ear:             Visual Acuity Screening   Right eye Left eye Both eyes  Without correction: 20/25 20/25 20/25   With correction:      Since his last visit he has follow-up with provider at [facility]. According to patient, feet circulation was evaluated and mildly abnormal. He does have a follow-up appointment, states that he supposed to call if symptoms get worse. PAD and HLD:  He is on  Eliquis 2.5 mg twice daily and pravastatin 20 mg daily  Lab Results  Component Value Date   CHOL 159 03/23/2020   HDL 72.20 03/23/2020   LDLCALC 74 03/23/2020   LDLDIRECT 111.0 08/14/2009   TRIG 63.0 03/23/2020   CHOLHDL 2 03/23/2020   Today he is complaining of groin pain. Last week he pull left groin muscle when he was doing heavy lifting.  Problem aggravated by pushing the land mower 5 days ago Sunday he noted some mild pain in right groin. Took Tylenol and today he feels better.  He has hx of inguinal hernia right side and occasionally he has some pain. He has not noted edema or bulged area on left side. Negative for fever, change in appetite, abnormal weight loss, abdominal pain, nausea, vomiting, changes in bowel habits, blood in the stool, or urinary symptoms.  Immunization History  Administered Date(s) Administered  . Fluad Quad(high Dose 65+) 08/05/2020  . Influenza Whole 08/14/2008, 08/17/2010  . Influenza, High Dose Seasonal PF 08/06/2015    Review of Systems  Constitutional: Negative for activity change, appetite change and fatigue.  HENT: Negative for nosebleeds and sore throat.   Respiratory: Negative for cough, shortness of breath and wheezing.   Cardiovascular: Negative for chest  pain, palpitations and leg swelling.  Genitourinary: Negative for decreased urine volume, dysuria and hematuria.  Musculoskeletal: Positive for arthralgias. Negative for gait problem.  Neurological: Negative for syncope, weakness and headaches.  Rest of ROS, see pertinent positives sand negatives in HPI  Current Outpatient Medications on File Prior to Visit  Medication Sig Dispense Refill  . acetaminophen (TYLENOL) 325 MG tablet Take 650 mg by mouth every 4 (four) hours as needed for mild pain or moderate pain.     Marland Kitchen apixaban (ELIQUIS) 2.5 MG TABS tablet Take 2.5 mg by mouth 2 (two) times daily.    . diclofenac Sodium (VOLTAREN) 1 % GEL Apply 4 g topically 4 (four) times daily. 150 g  1  . pravastatin (PRAVACHOL) 20 MG tablet Take 1 tablet (20 mg total) by mouth daily. 90 tablet 3  . tamsulosin (FLOMAX) 0.4 MG CAPS capsule TAKE 1 CAPSULE BY MOUTH EVERY DAY 30 capsule 0  . vardenafil (LEVITRA) 10 MG tablet Take 1 tablet (10 mg total) by mouth daily as needed for erectile dysfunction. 15 tablet 2   No current facility-administered medications on file prior to visit.   Past Medical History:  Diagnosis Date  . Arthritis   . BENIGN PROSTATIC HYPERTROPHY 02/13/2008  . COLONIC POLYPS 01/04/2008  . Diverticulosis of colon (without mention of hemorrhage) 01/04/2008  . Frequent headaches   . GERD 11/17/2010  . Hyperlipidemia   . Internal hemorrhoids with other complication 8/46/9629  . PROSTATITIS, RECURRENT 01/04/2008  . Pulmonary embolism ([place])   . PVD 08/17/2010  . TOBACCO USE, QUIT 08/14/2009   No Known Allergies  Social History   Socioeconomic History  . Marital status: Single    Spouse name: Not on file  . Number of children: 3  . Years of education: Not on file  . Highest education level: Not on file  Occupational History  . Not on file  Tobacco Use  . Smoking status: Former Smoker    Quit date: 1958    Years since quitting: 63.7  . Smokeless tobacco: Never Used  Substance and Sexual Activity  . Alcohol use: No  . Drug use: No  . Sexual activity: Not on file  Other Topics Concern  . Not on file  Social History Narrative  . Not on file   Social Determinants of Health   Financial Resource Strain:   . Difficulty of Paying Living Expenses: Not on file  Food Insecurity:   . Worried About Charity fundraiser in the Last Year: Not on file  . Ran Out of Food in the Last Year: Not on file  Transportation Needs:   . Lack of Transportation (Medical): Not on file  . Lack of Transportation (Non-Medical): Not on file  Physical Activity:   . Days of Exercise per Week: Not on file  . Minutes of Exercise per Session: Not on file  Stress:   . Feeling of Stress :  Not on file  Social Connections:   . Frequency of Communication with Friends and Family: Not on file  . Frequency of Social Gatherings with Friends and Family: Not on file  . Attends Religious Services: Not on file  . Active Member of Clubs or Organizations: Not on file  . Attends Archivist Meetings: Not on file  . Marital Status: Not on file    Vitals:   08/05/20 1206  BP: 122/80  Pulse: 67  Resp: 16  SpO2: 97%   Body mass index is 23.14 kg/m.  Physical Exam Nursing note reviewed.  Constitutional:      General: He is not in acute distress.    Appearance: He is well-developed and normal weight.  HENT:     Head: Normocephalic and atraumatic.     Ears:     Comments: Hearing aids in place.    Mouth/Throat:     Mouth: Mucous membranes are moist.     Pharynx: Oropharynx is clear.  Eyes:     Conjunctiva/sclera: Conjunctivae normal.  Cardiovascular:     Rate and Rhythm: Normal rate and regular rhythm.     Heart sounds: Murmur (SEM I/VI RUSB and LUSB) heard.      Comments: PT pulses present. Pulmonary:     Effort: Pulmonary effort is normal. No respiratory distress.     Breath sounds: Normal breath sounds.  Abdominal:     Palpations: Abdomen is soft. There is no mass.     Tenderness: There is no abdominal tenderness.  Musculoskeletal:     Left hip: No bony tenderness. Decreased range of motion (mild).     Comments: No pain with left hip flexion.  Lymphadenopathy:     Cervical: No cervical adenopathy.  Skin:    General: Skin is warm.     Findings: No erythema or rash.  Neurological:     Mental Status: He is alert and oriented to person, place, and time.     Cranial Nerves: No cranial nerve deficit.     Comments: Otherwise stable gait, not assisted.  Psychiatric:        Mood and Affect: Mood and affect normal.     Comments: Well groomed, good eye contact.   ASSESSMENT AND PLAN:  [name] was seen today for AWV.  Orders Placed This  Encounter  Procedures  . Flu Vaccine QUAD High Dose(Fluad)   Diagnoses and all orders for this visit:  Medicare annual wellness visit, subsequent We discussed the importance of staying active, physically and mentally, as well as the benefits of a healthy/balance diet. Low impact exercise that involve stretching and strengthing are ideal. Vaccines reported as up to date. Today he received flu vaccine. We discussed preventive screening for the next 5-10 years, summery of recommendations given in AVS. Fall prevention. He has some difficulty remembering words, 3 attempts. Some recommendations in regard to memory compensation strategies given. Advance directives and end of life discussed, he does have a POA and living will.   Inguinal pain, unspecified laterality ?  Muscle strain. Pain is improving. We discussed possible etiologies, including inguinal hernia. Instructed about warning signs.  PVD Continue pravastatin 20 mg daily and Eliquis 2.5 mg twice daily. It seems like he had CV testing at [facility], I cannot see reports. Clearly instructed about warning signs. Following with vascular at [facility].  Need for influenza vaccination -     Flu Vaccine QUAD High Dose(Fluad)  Return in about 1 year (around 08/05/2021) for cpe.   Zandyr Barnhill G. Martinique, MD  [facility]. [place] office.   [name] , Thank you for taking time to come for your Medicare Wellness Visit. I appreciate your ongoing commitment to your health goals. Please review the following plan we discussed and let me know if I can assist you in the future.   These are the goals we discussed: Goals    . Prevent falls     Slow movements and walking to prevent falls.        This is a list of the screening  recommended for you and due dates:  Health Maintenance  Topic Date Due  . COVID-19 Vaccine (1) Never done  . Pneumonia vaccines (1 of 2 - PCV13) Never done  . Flu Shot  06/07/2020  . Tetanus Vaccine  08/21/2021    Today flu vaccine. A few things to remember from today's visit:   Inguinal pain, unspecified laterality  PVD  If you need refills please call your pharmacy. Do not use My Chart to request refills or for acute issues that need immediate attention.   Please be sure medication list is accurate. If a new problem present, please set up appointment sooner than planned today.   Memory Compensation Strategies  1. Use "WARM" strategy.  W= write it down  A= associate it  R= repeat it  M= make a mental note  2.   You can keep a Social worker.  Use a 3-ring notebook with sections for the following: calendar, important names and phone numbers,  medications, doctors' names/phone numbers, lists/reminders, and a section to journal what you did  each day.   3.    Use a calendar to write appointments down.  4.    Write yourself a schedule for the day.  This can be placed on the calendar or in a separate section of the Memory Notebook.  Keeping a  regular schedule can help memory.  5.    Use medication organizer with sections for each day or morning/evening pills.  You may need help loading it  6.    Keep a basket, or pegboard by the door.  Place items that you need to take out with you in the basket or on the pegboard.  You may also want to  include a message board for reminders.  7.    Use sticky notes.  Place sticky notes with reminders in a place where the task is performed.  For example: " turn off the  stove" placed by the stove, "lock the door" placed on the door at eye level, " take your medications" on  the bathroom mirror or by the place where you normally take your medications.  8.    Use alarms/timers.  Use while cooking to remind yourself to check on food or as a reminder to take your medicine, or as a  reminder to make a call, or as a reminder to perform another task, etc.  No changes in medications today. Monitor for worsening groin pain or leg  pain.

## 2020-08-05 NOTE — Patient Instructions (Addendum)
  Connor Neal , Thank you for taking time to come for your Medicare Wellness Visit. I appreciate your ongoing commitment to your health goals. Please review the following plan we discussed and let me know if I can assist you in the future.   These are the goals we discussed: Goals    . Prevent falls     Slow movements and walking to prevent falls.        This is a list of the screening recommended for you and due dates:  Health Maintenance  Topic Date Due  . COVID-19 Vaccine (1) Never done  . Pneumonia vaccines (1 of 2 - PCV13) Never done  . Flu Shot  06/07/2020  . Tetanus Vaccine  08/21/2021   Today flu vaccine. A few things to remember from today's visit:   Inguinal pain, unspecified laterality  PVD  If you need refills please call your pharmacy. Do not use My Chart to request refills or for acute issues that need immediate attention.   Please be sure medication list is accurate. If a new problem present, please set up appointment sooner than planned today.   Memory Compensation Strategies  1. Use "WARM" strategy.  W= write it down  A= associate it  R= repeat it  M= make a mental note  2.   You can keep a Social worker.  Use a 3-ring notebook with sections for the following: calendar, important names and phone numbers,  medications, doctors' names/phone numbers, lists/reminders, and a section to journal what you did  each day.   3.    Use a calendar to write appointments down.  4.    Write yourself a schedule for the day.  This can be placed on the calendar or in a separate section of the Memory Notebook.  Keeping a  regular schedule can help memory.  5.    Use medication organizer with sections for each day or morning/evening pills.  You may need help loading it  6.    Keep a basket, or pegboard by the door.  Place items that you need to take out with you in the basket or on the pegboard.  You may also want to  include a message board for reminders.  7.     Use sticky notes.  Place sticky notes with reminders in a place where the task is performed.  For example: " turn off the  stove" placed by the stove, "lock the door" placed on the door at eye level, " take your medications" on  the bathroom mirror or by the place where you normally take your medications.  8.    Use alarms/timers.  Use while cooking to remind yourself to check on food or as a reminder to take your medicine, or as a  reminder to make a call, or as a reminder to perform another task, etc.  No changes in medications today. Monitor for worsening groin pain or leg pain.

## 2020-08-26 ENCOUNTER — Other Ambulatory Visit: Payer: Self-pay

## 2020-08-26 ENCOUNTER — Emergency Department (HOSPITAL_COMMUNITY)
Admission: EM | Admit: 2020-08-26 | Discharge: 2020-08-26 | Disposition: A | Discharge status: Home / Self Care | Payer: Medicare Other | Attending: Emergency Medicine | Admitting: Emergency Medicine

## 2020-08-26 ENCOUNTER — Encounter (HOSPITAL_COMMUNITY): Payer: Self-pay | Admitting: Emergency Medicine

## 2020-08-26 ENCOUNTER — Emergency Department (HOSPITAL_COMMUNITY): Payer: Medicare Other

## 2020-08-26 DIAGNOSIS — M25512 Pain in left shoulder: Secondary | ICD-10-CM | POA: Diagnosis present

## 2020-08-26 DIAGNOSIS — Z87891 Personal history of nicotine dependence: Secondary | ICD-10-CM | POA: Insufficient documentation

## 2020-08-26 DIAGNOSIS — W19XXXA Unspecified fall, initial encounter: Secondary | ICD-10-CM

## 2020-08-26 DIAGNOSIS — Z7901 Long term (current) use of anticoagulants: Secondary | ICD-10-CM | POA: Insufficient documentation

## 2020-08-26 DIAGNOSIS — W11XXXA Fall on and from ladder, initial encounter: Secondary | ICD-10-CM | POA: Diagnosis not present

## 2020-08-26 NOTE — ED Notes (Signed)
Pt returned from XRAY 

## 2020-08-26 NOTE — ED Provider Notes (Signed)
  Face-to-face evaluation   History: He is here for evaluation of injury, several days ago when he fell from a ladder.  He does not have ongoing headache, neck pain or back pain.  Physical exam: Alert elderly male, son at bedside for exam.  He has mild left shoulder pain, without deformity.  Left wrist is slightly swollen but not tender to palpation.  Mild blood pressure elevation, 192/88.  He denies prior history of hypertension.  Patient is instructed to use Tylenol for pain, and has blood pressure checked in a week or 2 by his PCP.  Son with patient and understands.  Medical screening examination/treatment/procedure(s) were conducted as a shared visit with non-physician practitioner(s) and myself.  I personally evaluated the patient during the encounter    Daleen Bo, MD 08/26/20 2322

## 2020-08-26 NOTE — Discharge Instructions (Signed)
Wear sling as directed. Tylenol for discomfort. Please refer to attached instructions.

## 2020-08-26 NOTE — ED Provider Notes (Signed)
[facility] EMERGENCY DEPARTMENT Provider Note   CSN: [id number] Arrival date & time: 08/26/20  1410     History Chief Complaint  Patient presents with   Connor Neal is a 86 y.o. male.  Patient states he was descending a ladder on Monday, trying to escape a bee, and lost his footing, falling 4-6 feet and landing on his left shoulder. He is complaining of shoulder pain that radiates to the upper arm. He is unable to lift his left arm at the shoulder without assistance. Range of motion limited due to pain. Patient did not hit his head or lose consciousness.  The history is provided by the patient.  Fall The current episode started 2 days ago. The problem has not changed since onset.      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   Dyslipidemia 08/22/2012   GERD 11/17/2010   PVD 08/17/2010   OTHER SYMPTOMS INVOLVING CARDIOVASCULAR SYSTEM 07/01/2010   NECK PAIN 06/30/2010   TOBACCO USE, QUIT 08/14/2009   ABDOMINAL PAIN 11/14/2008   COLONIC POLYPS, HX OF 08/14/2008   BPH associated with nocturia 02/13/2008   INTERNAL HEMORRHOIDS WITH OTHER COMPLICATION 22/63/3354   Diverticulosis of colon (without mention of hemorrhage) 01/04/2008   PROSTATITIS, RECURRENT 01/04/2008    Past Surgical History:  Procedure Laterality Date   Right wrist fracture         Family History  Problem Relation  Age of Onset   CAD Mother    CAD Sister     Social History   Tobacco Use   Smoking status: Former Smoker    Quit date: 1958    Years since quitting: 63.8   Smokeless tobacco: Never Used  Substance Use Topics   Alcohol use: No   Drug use: No    Home Medications Prior to Admission medications   Medication Sig Start Date End Date Taking? Authorizing Provider  acetaminophen (TYLENOL) 325 MG tablet Take 650 mg by mouth every 4 (four) hours as needed for mild pain or moderate pain.     [provider]  apixaban (ELIQUIS) 2.5 MG TABS tablet Take 2.5 mg by mouth 2 (two) times daily.    [provider]  diclofenac Sodium (VOLTAREN) 1 % GEL Apply 4 g topically 4 (four) times daily. 03/23/20   Martinique, Betty G, MD  pravastatin (PRAVACHOL) 20 MG tablet Take 1 tablet (20 mg total) by mouth daily. 06/27/20   Martinique, Betty G, MD  tamsulosin (FLOMAX) 0.4 MG CAPS capsule TAKE 1 CAPSULE BY MOUTH EVERY DAY 12/20/18   Laurey Morale, MD  vardenafil (LEVITRA) 10 MG tablet Take 1 tablet (10 mg total) by mouth daily as needed for erectile dysfunction. 04/26/20   Martinique, Betty G, MD    Allergies    Patient has no known allergies.  Review of Systems   Review of Systems  Musculoskeletal: Positive for arthralgias. Negative for back pain and neck  pain.  All other systems reviewed and are negative.   Physical Exam Updated Vital Signs BP (!) 194/89 (BP Location: Right Arm)    Pulse 73    Temp 98.5 F (36.9 C) (Oral)    Resp 14    Ht 5' 10.5" (1.791 m)    Wt 70.3 kg    SpO2 98%    BMI 21.93 kg/m   Physical Exam Vitals and nursing note reviewed.  Constitutional:      Appearance: Normal appearance.  HENT:     Head: Normocephalic.     Mouth/Throat:     Mouth: Mucous membranes are moist.  Eyes:     Conjunctiva/sclera: Conjunctivae normal.  Cardiovascular:     Rate and Rhythm: Normal rate.  Pulmonary:     Effort: Pulmonary effort is normal.  Abdominal:     Palpations: Abdomen  is soft.  Musculoskeletal:        General: Tenderness and signs of injury present.     Left shoulder: Tenderness present. No deformity. Decreased range of motion. Normal strength. Normal pulse.       Arms:     Cervical back: Normal range of motion and neck supple.     Comments: No clavicular pain or tenderness.  Skin:    General: Skin is warm and dry.  Neurological:     Mental Status: He is oriented to person, place, and time.  Psychiatric:        Mood and Affect: Mood normal.        Behavior: Behavior normal.     ED Results / Procedures / Treatments   Labs (all labs ordered are listed, but only abnormal results are displayed) Labs Reviewed - No data to display  EKG None  Radiology DG Shoulder Left  Result Date: 08/26/2020 CLINICAL DATA:  Left shoulder pain after falling. EXAM: LEFT SHOULDER - 2+ VIEW COMPARISON:  Chest radiographs 12/11/2019 FINDINGS: No evidence of acute fracture or dislocation. There are mild glenohumeral degenerative changes with associated chondrocalcinosis. There are moderate acromioclavicular degenerative changes which appear progressive. IMPRESSION: No acute osseous findings. Progressive degenerative changes as described. Electronically Signed   By: Richardean Sale M.D.   On: 08/26/2020 17:37   DG Humerus Left  Result Date: 08/26/2020 CLINICAL DATA:  Left arm pain, fall EXAM: LEFT HUMERUS - 2+ VIEW COMPARISON:  None. FINDINGS: Two view radiograph left humerus demonstrates normal alignment. No fracture or dislocation. Degenerative calcifications adjacent to the humeral head may relate to rotator cuff degeneration or capsular calcifications related to remote trauma or inflammation. Soft tissues are otherwise unremarkable. IMPRESSION: No acute fracture or dislocation. Electronically Signed   By: Fidela Salisbury MD   On: 08/26/2020 14:53    Procedures Procedures (including critical care time)  Medications Ordered in ED Medications - No data to  display  ED Course  I have reviewed the triage vital signs and the nursing notes.  Pertinent labs & imaging results that were available during my care of the patient were reviewed by me and considered in my medical decision making (see chart for details).   Patient discussed with and seen by Dr. Eulis Foster. MDM Rules/Calculators/A&P                          Patient X-Ray negative for obvious fracture or dislocation.  Pt advised to follow up with orthopedics. Patient given sling while in ED, conservative therapy recommended and discussed. Patient will be discharged home & is agreeable  with above plan. Returns precautions discussed. Pt appears safe for discharge.  Patient noted to be hypertensive in the emergency department.  No signs of hypertensive urgency.  Discussed with patient the need for close follow-up and management by their primary care physician.  Final Clinical Impression(s) / ED Diagnoses Final diagnoses:  Fall, initial encounter  Acute pain of left shoulder    Rx / DC Orders ED Discharge Orders    None       Etta Quill, NP 08/26/20 1954    Daleen Bo, MD 08/26/20 2322

## 2020-08-26 NOTE — ED Triage Notes (Signed)
Pt states he was on a ladder on Monday, a bee flew around him and while trying to swat it away he fell approx 8 feet onto his L arm. Denies hitting head or LOC. Decreased ROM of L arm

## 2020-08-26 NOTE — ED Notes (Signed)
Patient verbalizes understanding of discharge instructions. Opportunity for questioning and answers were provided. Armband removed by staff, pt discharged from ED ambulatory to home.  

## 2020-09-15 ENCOUNTER — Encounter: Payer: Self-pay | Admitting: Family Medicine

## 2020-09-18 ENCOUNTER — Encounter: Payer: Self-pay | Admitting: Family Medicine

## 2020-09-18 ENCOUNTER — Other Ambulatory Visit: Payer: Self-pay | Admitting: Family Medicine

## 2020-09-18 DIAGNOSIS — M25562 Pain in left knee: Secondary | ICD-10-CM

## 2020-09-18 DIAGNOSIS — M25512 Pain in left shoulder: Secondary | ICD-10-CM

## 2020-09-18 MED ORDER — DICLOFENAC SODIUM 1 % EX GEL: 4.0000 g | Freq: Four times a day (QID) | CUTANEOUS | 1 refills | Status: DC

## 2020-10-15 ENCOUNTER — Telehealth: Payer: Self-pay | Admitting: Family Medicine

## 2020-10-15 NOTE — Telephone Encounter (Signed)
Patient daughter is calling and stated that patient was seen in the ER for pain in his arm that is not getting any better. Daughter wanted to know if patient needs to have a MRI done, please advise. CB is [phone]

## 2020-10-16 ENCOUNTER — Encounter: Payer: Self-pay | Admitting: Family Medicine

## 2020-10-19 ENCOUNTER — Other Ambulatory Visit: Payer: Self-pay

## 2020-10-19 DIAGNOSIS — M25512 Pain in left shoulder: Secondary | ICD-10-CM

## 2020-10-19 NOTE — Telephone Encounter (Signed)
He may need an MRI, usually ordered by ortho if surgical treatment is being considered. I have not evaluated him after ER visit but shoulder X ray done in the ER did not show fracture. It did show OA like changes, which could be contributing to shoulder pain. Topical Voltaren may help. Tylenol 500 mg q 6 hours ,max 4 tabs daily, for pain management. If pain is severe we can arrange appt to discuss other options while he is being evaluated by ortho. Thanks, BJ

## 2020-10-19 NOTE — Telephone Encounter (Signed)
See my chart encounter.

## 2020-10-19 NOTE — Telephone Encounter (Signed)
Ortho referral is pending. Okay to place order for MRI?

## 2020-10-22 ENCOUNTER — Other Ambulatory Visit: Payer: Self-pay

## 2020-10-22 ENCOUNTER — Emergency Department (HOSPITAL_COMMUNITY)
Admission: EM | Admit: 2020-10-22 | Discharge: 2020-10-22 | Disposition: A | Discharge status: Home / Self Care | Payer: Medicare Other | Attending: Emergency Medicine | Admitting: Emergency Medicine

## 2020-10-22 ENCOUNTER — Ambulatory Visit: Payer: Medicare Other | Admitting: Orthopaedic Surgery

## 2020-10-22 DIAGNOSIS — M546 Pain in thoracic spine: Secondary | ICD-10-CM | POA: Insufficient documentation

## 2020-10-22 DIAGNOSIS — Z5321 Procedure and treatment not carried out due to patient leaving prior to being seen by health care provider: Secondary | ICD-10-CM | POA: Diagnosis not present

## 2020-10-22 NOTE — ED Notes (Signed)
Pt left because of wait time.

## 2020-10-22 NOTE — ED Triage Notes (Signed)
Pt here for eval of central upper back pain, onset last night while lying down. Pain is intermittent, never resolving completely but does improve at times. Pain unchanged with movement. Had same problem one month ago, with resolution without intervention.

## 2020-10-27 ENCOUNTER — Ambulatory Visit: Payer: Self-pay

## 2020-10-27 ENCOUNTER — Other Ambulatory Visit: Payer: Self-pay

## 2020-10-27 ENCOUNTER — Ambulatory Visit (INDEPENDENT_AMBULATORY_CARE_PROVIDER_SITE_OTHER): Payer: Medicare Other | Admitting: Orthopaedic Surgery

## 2020-10-27 ENCOUNTER — Encounter: Payer: Self-pay | Admitting: Orthopaedic Surgery

## 2020-10-27 DIAGNOSIS — M7502 Adhesive capsulitis of left shoulder: Secondary | ICD-10-CM

## 2020-10-27 DIAGNOSIS — M75102 Unspecified rotator cuff tear or rupture of left shoulder, not specified as traumatic: Secondary | ICD-10-CM | POA: Diagnosis not present

## 2020-10-27 NOTE — Progress Notes (Signed)
Subjective: Patient is here for ultrasound-guided intra-articular left glenohumeral injection.    Objective:  Pain and limited ROM.  Procedure: Ultrasound guided injection is preferred based studies that show increased duration, increased effect, greater accuracy, decreased procedural pain, increased response rate, and decreased cost with ultrasound guided versus blind injection.   Verbal informed consent obtained.  Time-out conducted.  Noted no overlying erythema, induration, or other signs of local infection. Ultrasound-guided left glenohumeral injection: After sterile prep with Betadine, injected 4 cc 0.25% bupivocaine without epinephrine and 6 mg betamethasone using a 22-gauge spinal needle, passing the needle from posterior approach into the glenohumeral joint.  Injectate seen filling joint capsule.

## 2020-10-27 NOTE — Progress Notes (Signed)
Office Visit Note   Patient: Connor Neal           Date of Birth: [birth year]           MRN: [id number] Visit Date: 10/27/2020              Requested by: Martinique, Betty G, MD [address] [place],  [place] [address] PCP: Martinique, Betty G, MD   Assessment & Plan: Visit Diagnoses:  1. Rotator cuff syndrome of left shoulder   2. Adhesive capsulitis of left shoulder     Plan: Impression is left shoulder rotator cuff tears with development of adhesive capsulitis.  Given the patient's age and functional level we agree the best course of action is to try to rehab this with physical therapy and cortisone injections periodically.  I explained to the daughter why an MRI is not necessary at this time with which she is in agreement.  We will assess go ahead and make a referral to physical therapy as well as a glenohumeral injection with Dr. Junius Roads.  Recheck in 8 weeks.  Follow-Up Instructions: Return in 8 weeks (on 12/22/2020).   Orders:  Orders Placed This Encounter  Procedures  . US Guided Needle Placement - No Linked Charges  . Ambulatory referral to Physical Therapy   No orders of the defined types were placed in this encounter.     Procedures: No procedures performed   Clinical Data: No additional findings.   Subjective: Chief Complaint  Patient presents with  . Left Shoulder - Pain    Connor Neal is a 86 year old gentleman brought in by his daughter for evaluation of chronic left shoulder pain since a mechanical fall on 08/26/2020.  He missed a step off a ladder and fell onto the shoulder.  He has been wearing a sling.  He is right-hand dominant.  He has noticed decreased range of motion.  Denies any numbness and tingling.  Pain will radiate from the shoulder to the elbow.  He takes Tylenol on a fairly regular basis.   Review of Systems  Constitutional: Negative.   All other systems reviewed and are negative.    Objective: Vital Signs: There were no  vitals taken for this visit.  Physical Exam Vitals and nursing note reviewed.  Constitutional:      Appearance: He is well-developed and well-nourished.  HENT:     Head: Normocephalic and atraumatic.  Eyes:     Pupils: Pupils are equal, round, and reactive to light.  Pulmonary:     Effort: Pulmonary effort is normal.  Abdominal:     Palpations: Abdomen is soft.  Musculoskeletal:        General: Normal range of motion.     Cervical back: Neck supple.  Skin:    General: Skin is warm.  Neurological:     Mental Status: He is alert and oriented to person, place, and time.  Psychiatric:        Mood and Affect: Mood and affect normal.        Behavior: Behavior normal.        Thought Content: Thought content normal.        Judgment: Judgment normal.     Ortho Exam Left shoulder shows moderate restriction in active and passive range of motion.  Manual muscle testing of supraspinatus, infraspinatus, subscapularis is markedly weak.  No significant tenderness to palpation. Specialty Comments:  No specialty comments available.  Imaging: US Guided Needle Placement - No Linked Charges  Result Date: 10/27/2020 Ultrasound guided injection is preferred based studies that show increased duration, increased effect, greater accuracy, decreased procedural pain, increased response rate, and decreased cost with ultrasound guided versus blind injection.   Verbal informed consent obtained.  Time-out conducted.  Noted no overlying erythema, induration, or other signs of local infection. Ultrasound-guided left glenohumeral injection: After sterile prep with Betadine, injected 4 cc 0.25% bupivocaine without epinephrine and 6 mg betamethasone using a 22-gauge spinal needle, passing the needle from posterior approach into the glenohumeral joint.  Injectate seen filling joint capsule.      PMFS History: Patient Active Problem List   Diagnosis Date Noted  . Adhesive capsulitis of left shoulder 10/27/2020   . Rotator cuff syndrome of left shoulder 10/27/2020  . ED (erectile dysfunction) 03/23/2020  . 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 Dyslipidemia 08/22/2012  . GERD 11/17/2010  . PVD 08/17/2010  . OTHER SYMPTOMS INVOLVING CARDIOVASCULAR SYSTEM 07/01/2010  . NECK PAIN 06/30/2010  . TOBACCO USE, QUIT 08/14/2009  . ABDOMINAL PAIN 11/14/2008  . COLONIC POLYPS, HX OF 08/14/2008  . BPH associated with nocturia 02/13/2008  . INTERNAL HEMORRHOIDS WITH OTHER COMPLICATION 123XX123  . Diverticulosis of colon (without mention of hemorrhage) 01/04/2008  . PROSTATITIS, RECURRENT 01/04/2008   Past Medical History:  Diagnosis Date  . Arthritis   . BENIGN PROSTATIC HYPERTROPHY 02/13/2008  . COLONIC POLYPS 01/04/2008  . Diverticulosis of colon (without mention of hemorrhage) 01/04/2008  . Frequent headaches   . GERD 11/17/2010  . Hyperlipidemia   . Internal hemorrhoids with other complication [id number]  . PROSTATITIS, RECURRENT 01/04/2008  . Pulmonary embolism ([place])   . PVD 08/17/2010  . TOBACCO USE, QUIT 08/14/2009    Family History  Problem Relation Age of Onset  . CAD Mother   . CAD Sister     Past Surgical History:  Procedure Laterality Date  . Right wrist fracture     Social History   Occupational History  . Not on file  Tobacco Use  . Smoking status: Former Smoker    Quit date: 1958    Years since quitting: 64.0  . Smokeless tobacco: Never Used  Substance and Sexual Activity  . Alcohol use: No  . Drug use: No  . Sexual activity: Not on file

## 2020-11-17 ENCOUNTER — Encounter: Payer: Self-pay | Admitting: Rehabilitative and Restorative Service Providers"

## 2020-11-17 ENCOUNTER — Ambulatory Visit (INDEPENDENT_AMBULATORY_CARE_PROVIDER_SITE_OTHER): Payer: Medicare Other | Admitting: Rehabilitative and Restorative Service Providers"

## 2020-11-17 ENCOUNTER — Other Ambulatory Visit: Payer: Self-pay

## 2020-11-17 DIAGNOSIS — R293 Abnormal posture: Secondary | ICD-10-CM

## 2020-11-17 DIAGNOSIS — M25612 Stiffness of left shoulder, not elsewhere classified: Secondary | ICD-10-CM

## 2020-11-17 DIAGNOSIS — M25512 Pain in left shoulder: Secondary | ICD-10-CM | POA: Diagnosis not present

## 2020-11-17 DIAGNOSIS — G8929 Other chronic pain: Secondary | ICD-10-CM

## 2020-11-17 DIAGNOSIS — M6281 Muscle weakness (generalized): Secondary | ICD-10-CM | POA: Diagnosis not present

## 2020-11-17 NOTE — Patient Instructions (Signed)
Access Code: [email or web address] URL: [email or web address] Date: 11/17/2020 Prepared by: Scot Jun  Exercises Seated Scapular Retraction - 2 x daily - 7 x weekly - 2 sets - 10 reps - 5 hold Supine Shoulder Flexion Extension AAROM with Dowel - 2 x daily - 7 x weekly - 3 sets - 10 reps - 5 hold Supine Shoulder Abduction AAROM with Dowel - 2 x daily - 7 x weekly - 3 sets - 10 reps

## 2020-11-17 NOTE — Therapy (Signed)
[place], [place], [address] Phone: [phone]   Fax:  [phone]  Physical Therapy Evaluation  Patient Details  Name: Connor Neal MRN: [id number] Date of Birth: [birth year] Referring Provider (PT): Dr. Erlinda Hong   Encounter Date: 11/17/2020   PT End of Session - 11/17/20 1013    Visit Number 1    Number of Visits 16    Date for PT Re-Evaluation 01/26/21    Authorization Type Medicare    Progress Note Due on Visit 10    PT Start Time [phone]    PT Stop Time 1015    PT Time Calculation (min) 33 min    Activity Tolerance Patient tolerated treatment well    Behavior During Therapy [facility] for tasks assessed/performed           Past Medical History:  Diagnosis Date  . Arthritis   . BENIGN PROSTATIC HYPERTROPHY 02/13/2008  . COLONIC POLYPS 01/04/2008  . Diverticulosis of colon (without mention of hemorrhage) 01/04/2008  . Frequent headaches   . GERD 11/17/2010  . Hyperlipidemia   . Internal hemorrhoids with other complication 99991111  . PROSTATITIS, RECURRENT 01/04/2008  . Pulmonary embolism ([place])   . PVD 08/17/2010  . TOBACCO USE, QUIT 08/14/2009    Past Surgical History:  Procedure Laterality Date  . Right wrist fracture      There were no vitals filed for this visit.    Subjective Assessment - 11/17/20 0944    Subjective Pt. indicated fall off ladder onto Lt shoulder c pain noted.  Pt. stated difficulty c lifting arm and pain.  xrays were negative.  Pt. stated trying to do some movement but having some trouble.    Limitations Lifting;House hold activities    Patient Stated Goals Improve arm movement    Currently in Pain? Yes    Pain Score 2     Pain Location Shoulder    Pain Orientation Left    Pain Descriptors / Indicators Aching    Pain Type Chronic pain    Aggravating Factors  lifting arm, lifting/carrying items, motion of Lt shoulder    Pain Relieving Factors Rest    Effect of Pain on Daily  Activities Lt UE use in lifting, carrying, garden work.              [facility] PT Assessment - 11/17/20 0001      Assessment   Medical Diagnosis Adhesive capsulitis, RTC Lt shoulder    Referring Provider (PT) Dr. Erlinda Hong    Onset Date/Surgical Date 08/26/20    Hand Dominance Right      Restrictions   Weight Bearing Restrictions No      Balance Screen   Has the patient fallen in the past 6 months Yes    How many times? 1   off ladder     [place] residence    Additional Comments Lives alone      Prior Function   Level of [place] Requirements work at [employer] garden work      Cognition   Overall Cognitive Status Within [employer] for tasks assessed      Observation/Other Assessments   Focus on Therapeutic Outcomes (FOTO)  Intake 48%      Posture/Postural Control   Posture Comments Rounded shoulder, FHP, increased thoracic kyphosis in sitting      ROM / Strength   AROM / PROM / Strength AROM;PROM;Strength  AROM   Overall AROM Comments pain in mid and end range all movement.    AROM Assessment Site Shoulder    Right/Left Shoulder Left;Right    Right Shoulder Flexion 120 Degrees   seated   Left Shoulder Flexion 110 Degrees   in supine (45 deg in sitting)   Left Shoulder ABduction 95 Degrees   in supine   Left Shoulder Internal Rotation 65 Degrees   measured in supine   Left Shoulder External Rotation 25 Degrees   in supine     PROM   Overall PROM Comments pain at end range    PROM Assessment Site Shoulder    Right/Left Shoulder Left;Right    Left Shoulder Flexion 115 Degrees    Left Shoulder ABduction 100 Degrees    Left Shoulder Internal Rotation 65 Degrees   in 30 deg abduction in supine   Left Shoulder External Rotation 30 Degrees   supine in 30 deg abd     Strength   Overall Strength Comments pain at end range    Strength Assessment Site Shoulder    Right/Left Shoulder  Left;Right    Right Shoulder Flexion 5/5    Right Shoulder ABduction 5/5    Right Shoulder Internal Rotation 5/5    Right Shoulder External Rotation 5/5    Left Shoulder Flexion 2+/5    Left Shoulder ABduction 2/5    Left Shoulder Internal Rotation 4/5    Left Shoulder External Rotation 2+/5      Palpation   Palpation comment Jt moblity limitation in inferior, ap directions Lt Gh Jt      Special Tests   Other special tests + shrug on Lt, painful arc Lt, drop on Lt, ER lag on Lt                      Objective measurements completed on examination: See above findings.       [facility] Adult PT Treatment/Exercise - 11/17/20 0001      Exercises   Exercises Other Exercises;Shoulder    Other Exercises  HEP instruction/performance c cues for techniques, handout provided.  HEP performed c trial set of each for supine wand flexion, abduction in supine, scap retraction      Manual Therapy   Manual therapy comments PROM Lt GH jt all directions, g2-g3 inferior jt mobs, ap mobs                  PT Education - 11/17/20 1014    Education Details HEP, POC    Person(s) Educated Patient    Methods Explanation;Demonstration;Handout;Verbal cues    Comprehension Returned demonstration;Verbalized understanding            PT Short Term Goals - 11/17/20 1012      PT SHORT TERM GOAL #1   Title Patient will demonstrate independent use of home exercise program to maintain progress from in clinic treatments.    Time 3    Period Weeks    Status New    Target Date 12/08/20             PT Long Term Goals - 11/17/20 1013      PT LONG TERM GOAL #1   Title Patient will demonstrate/report pain at worst less than or equal to 2/10 to facilitate minimal limitation in daily activity secondary to pain symptoms.    Time 10    Period Weeks    Status New    Target Date 01/26/21  PT LONG TERM GOAL #2   Title Patient will demonstrate independent use of home exercise program  to facilitate ability to maintain/progress functional gains from skilled physical therapy services.    Time 10    Period Weeks    Status New    Target Date 01/26/21      PT LONG TERM GOAL #3   Title Pt. will demonstrate Lt GH AROM flexion >120, abd > 120, ER > 60 to facilitate ability to perform usual self care, dressing and gardening activity at PLOF.    Time 10    Period Weeks    Status New    Target Date 01/26/21      PT LONG TERM GOAL #4   Title Pt. will demonstrate Lt UE MMT > or = 4/5 to facilitate ability to perform lifting, carrying in household and self care activity at PLOF.    Time 10    Period Weeks    Status New    Target Date 01/26/21      PT LONG TERM GOAL #5   Title Pt. will demonstrate FOTO outcome > or = 62 to facilitate reduced disability secondary to symptoms.    Time 10    Period Weeks    Status New    Target Date 01/26/21                  Plan - 11/17/20 1009    Clinical Impression Statement Patient is a 87 y.o. male who comes to clinic with complaints of Lt shoulder pain with mobility, strength and movement coordination deficits that impair their ability to perform usual daily and recreational functional activities without increase difficulty/symptoms at this time.  Patient to benefit from skilled PT services to address impairments and limitations to improve to previous level of function without restriction secondary to condition.    Personal Factors and Comorbidities Comorbidity 2    Comorbidities GERD, hyperlipidemia    Examination-Activity Limitations Bathing;Sleep;Carry;Lift;Hygiene/Grooming;Dressing;Reach Overhead    Examination-Participation Restrictions Art gallery manager;Yard Work    Merchant navy officer Stable/Uncomplicated    Designer, jewellery Low    Rehab Potential Fair    PT Frequency 2x / week    PT Duration Other (comment)   10 weeks   PT Treatment/Interventions ADLs/Self Care Home  Management;Cryotherapy;Electrical Stimulation;Iontophoresis 4mg /ml Dexamethasone;Moist Heat;Balance training;Therapeutic exercise;Therapeutic activities;Functional mobility training;Stair training;Gait training;DME Instruction;Ultrasound;Neuromuscular re-education;Patient/family education;Manual techniques;Taping;Dry needling;Passive range of motion;Spinal Manipulations;Joint Manipulations    PT Next Visit Plan Progress mobility c manual and ther ex in all directions, active assisted movement in gravity reduced positioning, isometrics    PT Home Exercise Plan [email or web address]    Consulted and Agree with Plan of Care Patient           Patient will benefit from skilled therapeutic intervention in order to improve the following deficits and impairments:  Decreased endurance,Hypomobility,Decreased activity tolerance,Decreased strength,Impaired UE functional use,Pain,Decreased mobility,Decreased range of motion,Impaired perceived functional ability,Improper body mechanics,Postural dysfunction,Impaired flexibility,Decreased coordination  Visit Diagnosis: Chronic left shoulder pain  Muscle weakness (generalized)  Stiffness of left shoulder, not elsewhere classified  Abnormal posture     Problem List Patient Active Problem List   Diagnosis Date Noted  . Adhesive capsulitis of left shoulder 10/27/2020  . Rotator cuff syndrome of left shoulder 10/27/2020  . ED (erectile dysfunction) 03/23/2020  . 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 Dyslipidemia 08/22/2012  . GERD 11/17/2010  . PVD 08/17/2010  . OTHER SYMPTOMS INVOLVING CARDIOVASCULAR SYSTEM 07/01/2010  . NECK PAIN 06/30/2010  .  TOBACCO USE, QUIT 08/14/2009  . ABDOMINAL PAIN 11/14/2008  . COLONIC POLYPS, HX OF 08/14/2008  . BPH associated with nocturia 02/13/2008  . INTERNAL HEMORRHOIDS WITH OTHER COMPLICATION 123XX123  . Diverticulosis of colon (without mention of hemorrhage) 01/04/2008  . PROSTATITIS, RECURRENT  01/04/2008    Scot Jun, PT, DPT, OCS, ATC 11/17/20  10:26 AM    [facility] [address] [place], [place], [address] Phone: [phone]   Fax:  [phone]  Name: Tavious Caraher MRN: [id number] Date of Birth: 1931-04-27

## 2020-11-23 ENCOUNTER — Encounter: Payer: Medicare Other | Admitting: Rehabilitative and Restorative Service Providers"

## 2020-11-30 ENCOUNTER — Encounter: Payer: Self-pay | Admitting: Rehabilitative and Restorative Service Providers"

## 2020-11-30 ENCOUNTER — Other Ambulatory Visit: Payer: Self-pay

## 2020-11-30 ENCOUNTER — Ambulatory Visit (INDEPENDENT_AMBULATORY_CARE_PROVIDER_SITE_OTHER): Payer: Medicare Other | Admitting: Rehabilitative and Restorative Service Providers"

## 2020-11-30 DIAGNOSIS — R293 Abnormal posture: Secondary | ICD-10-CM

## 2020-11-30 DIAGNOSIS — M25612 Stiffness of left shoulder, not elsewhere classified: Secondary | ICD-10-CM | POA: Diagnosis not present

## 2020-11-30 DIAGNOSIS — G8929 Other chronic pain: Secondary | ICD-10-CM

## 2020-11-30 DIAGNOSIS — M6281 Muscle weakness (generalized): Secondary | ICD-10-CM

## 2020-11-30 DIAGNOSIS — M25512 Pain in left shoulder: Secondary | ICD-10-CM | POA: Diagnosis not present

## 2020-11-30 NOTE — Therapy (Signed)
[place], [place], [address] Phone: [phone]   Fax:  [phone]  Physical Therapy Treatment  Patient Details  Name: Connor Neal MRN: [id number] Date of Birth: January 11, 1931 Referring Provider (PT): Dr. Erlinda Hong   Encounter Date: 11/30/2020   PT End of Session - 11/30/20 1345    Visit Number 2    Number of Visits 16    Date for PT Re-Evaluation 01/26/21    Authorization Type Medicare    Progress Note Due on Visit 10    PT Start Time 4010    PT Stop Time 1424    PT Time Calculation (min) 39 min    Activity Tolerance Patient tolerated treatment well    Behavior During Therapy [facility] for tasks assessed/performed           Past Medical History:  Diagnosis Date  . Arthritis   . BENIGN PROSTATIC HYPERTROPHY 02/13/2008  . COLONIC POLYPS 01/04/2008  . Diverticulosis of colon (without mention of hemorrhage) 01/04/2008  . Frequent headaches   . GERD 11/17/2010  . Hyperlipidemia   . Internal hemorrhoids with other complication 2/72/5366  . PROSTATITIS, RECURRENT 01/04/2008  . Pulmonary embolism ([place])   . PVD 08/17/2010  . TOBACCO USE, QUIT 08/14/2009    Past Surgical History:  Procedure Laterality Date  . Right wrist fracture      There were no vitals filed for this visit.   Subjective Assessment - 11/30/20 1347    Subjective Pt. indicated feeling some soreness and pain at times after exercise.  Overall no pain at rest today.    Limitations Lifting;House hold activities    Patient Stated Goals Improve arm movement    Currently in Pain? No/denies    Pain Score 0-No pain                             [facility] Adult PT Treatment/Exercise - 11/30/20 0001      Shoulder Exercises: Supine   Other Supine Exercises 90 deg flexion holds to fatigue 20 seconds, 30 seconds    Other Supine Exercises wand flexion 1 lb 2 x 10 c 2-3 second hold at end range      Shoulder Exercises: Pulleys   Flexion 3 minutes     Scaption 3 minutes      Manual Therapy   Manual therapy comments PROM Lt GH, g3/g4 inferior jt mobs, ap mobs, contract/relax for rotation movement gains                    PT Short Term Goals - 11/30/20 1348      PT SHORT TERM GOAL #1   Title Patient will demonstrate independent use of home exercise program to maintain progress from in clinic treatments.    Time 3    Period Weeks    Status On-going    Target Date 12/08/20             PT Long Term Goals - 11/17/20 1013      PT LONG TERM GOAL #1   Title Patient will demonstrate/report pain at worst less than or equal to 2/10 to facilitate minimal limitation in daily activity secondary to pain symptoms.    Time 10    Period Weeks    Status New    Target Date 01/26/21      PT LONG TERM GOAL #2   Title Patient will demonstrate independent use of home exercise  program to facilitate ability to maintain/progress functional gains from skilled physical therapy services.    Time 10    Period Weeks    Status New    Target Date 01/26/21      PT LONG TERM GOAL #3   Title Pt. will demonstrate Lt GH AROM flexion >120, abd > 120, ER > 60 to facilitate ability to perform usual self care, dressing and gardening activity at PLOF.    Time 10    Period Weeks    Status New    Target Date 01/26/21      PT LONG TERM GOAL #4   Title Pt. will demonstrate Lt UE MMT > or = 4/5 to facilitate ability to perform lifting, carrying in household and self care activity at PLOF.    Time 10    Period Weeks    Status New    Target Date 01/26/21      PT LONG TERM GOAL #5   Title Pt. will demonstrate FOTO outcome > or = 62 to facilitate reduced disability secondary to symptoms.    Time 10    Period Weeks    Status New    Target Date 01/26/21                 Plan - 11/30/20 1416    Clinical Impression Statement Continued improvement in HEP recall to benefit Pt.'s performance at home.  Cues given throughout visit to improve  techniques.  Pt. did demonstrated some improvement in flexion mobility passively today but continued to present c capsular pattern c strength deficits.    Personal Factors and Comorbidities Comorbidity 2    Comorbidities GERD, hyperlipidemia    Examination-Activity Limitations Bathing;Sleep;Carry;Lift;Hygiene/Grooming;Dressing;Reach Overhead    Examination-Participation Restrictions Art gallery manager;Yard Work    Stability/Clinical Decision Making Stable/Uncomplicated    Rehab Potential Fair    PT Frequency 2x / week    PT Duration Other (comment)   10 weeks   PT Treatment/Interventions ADLs/Self Care Home Management;Cryotherapy;Electrical Stimulation;Iontophoresis 4mg /ml Dexamethasone;Moist Heat;Balance training;Therapeutic exercise;Therapeutic activities;Functional mobility training;Stair training;Gait training;DME Instruction;Ultrasound;Neuromuscular re-education;Patient/family education;Manual techniques;Taping;Dry needling;Passive range of motion;Spinal Manipulations;Joint Manipulations    PT Next Visit Plan Progress mobility c manual and ther ex in all directions, active assisted movement in gravity reduced positioning, isometrics    PT Home Exercise Plan [id number]    Consulted and Agree with Plan of Care Patient           Patient will benefit from skilled therapeutic intervention in order to improve the following deficits and impairments:  Decreased endurance,Hypomobility,Decreased activity tolerance,Decreased strength,Impaired UE functional use,Pain,Decreased mobility,Decreased range of motion,Impaired perceived functional ability,Improper body mechanics,Postural dysfunction,Impaired flexibility,Decreased coordination  Visit Diagnosis: Chronic left shoulder pain  Muscle weakness (generalized)  Stiffness of left shoulder, not elsewhere classified  Abnormal posture     Problem List Patient Active Problem List   Diagnosis Date Noted  . Adhesive capsulitis of left  shoulder 10/27/2020  . Rotator cuff syndrome of left shoulder 10/27/2020  . ED (erectile dysfunction) 03/23/2020  . 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) 04/10/2015  . Dyslipidemia 08/22/2012  . GERD 11/17/2010  . PVD 08/17/2010  . OTHER SYMPTOMS INVOLVING CARDIOVASCULAR SYSTEM 07/01/2010  . NECK PAIN 06/30/2010  . TOBACCO USE, QUIT 08/14/2009  . ABDOMINAL PAIN 11/14/2008  . COLONIC POLYPS, HX OF 08/14/2008  . BPH associated with nocturia 02/13/2008  . INTERNAL HEMORRHOIDS WITH OTHER COMPLICATION 123XX123  . Diverticulosis of colon (without mention of hemorrhage) 01/04/2008  . PROSTATITIS, RECURRENT 01/04/2008    Legrand Como  Joya Gaskins PT, DPT, OCS, ATC 11/30/20  2:18 PM    [facility] [address] [place], [place], [address] Phone: [phone]   Fax:  [phone]  Name: Connor Neal MRN: [id number] Date of Birth: 03/02/1931

## 2020-12-07 ENCOUNTER — Ambulatory Visit (INDEPENDENT_AMBULATORY_CARE_PROVIDER_SITE_OTHER): Payer: Medicare Other | Admitting: Rehabilitative and Restorative Service Providers"

## 2020-12-07 ENCOUNTER — Other Ambulatory Visit: Payer: Self-pay

## 2020-12-07 ENCOUNTER — Encounter: Payer: Self-pay | Admitting: Rehabilitative and Restorative Service Providers"

## 2020-12-07 DIAGNOSIS — R293 Abnormal posture: Secondary | ICD-10-CM | POA: Diagnosis not present

## 2020-12-07 DIAGNOSIS — G8929 Other chronic pain: Secondary | ICD-10-CM

## 2020-12-07 DIAGNOSIS — M25512 Pain in left shoulder: Secondary | ICD-10-CM | POA: Diagnosis not present

## 2020-12-07 DIAGNOSIS — M6281 Muscle weakness (generalized): Secondary | ICD-10-CM | POA: Diagnosis not present

## 2020-12-07 DIAGNOSIS — M25612 Stiffness of left shoulder, not elsewhere classified: Secondary | ICD-10-CM | POA: Diagnosis not present

## 2020-12-07 NOTE — Patient Instructions (Signed)
Access Code: [email or web address] URL: [email or web address] Date: 12/07/2020 Prepared by: Scot Jun  Exercises Seated Scapular Retraction - 2 x daily - 7 x weekly - 2 sets - 10 reps - 5 hold Supine Shoulder Flexion Extension AAROM with Dowel - 2 x daily - 7 x weekly - 3 sets - 10 reps - 5 hold Supine Shoulder Abduction AAROM with Dowel - 2 x daily - 7 x weekly - 3 sets - 10 reps Isometric Shoulder Flexion at Wall - 2 x daily - 7 x weekly - 1 sets - 15 reps - 5 hold Isometric Shoulder External Rotation at Wall - 2 x daily - 7 x weekly - 1 sets - 15 reps - 5 hold Standing Isometric Shoulder Internal Rotation at Doorway - 2 x daily - 7 x weekly - 1 sets - 15 reps - 5 hold Standing Isometric Shoulder Abduction with Doorway - Arm Bent - 2 x daily - 7 x weekly - 1 sets - 15 reps - 5 hold

## 2020-12-07 NOTE — Therapy (Signed)
[place], [place], [address] Phone: [phone]   Fax:  [phone]  Physical Therapy Treatment  Patient Details  Name: Connor Neal MRN: [id number] Date of Birth: 1931/01/20 Referring Provider (PT): Dr. Erlinda Hong   Encounter Date: 12/07/2020   PT End of Session - 12/07/20 1345    Visit Number 3    Number of Visits 16    Date for PT Re-Evaluation 01/26/21    Authorization Type Medicare    Progress Note Due on Visit 10    PT Start Time [id number]    PT Stop Time 1424    PT Time Calculation (min) 39 min    Activity Tolerance Patient tolerated treatment well    Behavior During Therapy [facility] for tasks assessed/performed           Past Medical History:  Diagnosis Date  . Arthritis   . BENIGN PROSTATIC HYPERTROPHY 02/13/2008  . COLONIC POLYPS 01/04/2008  . Diverticulosis of colon (without mention of hemorrhage) 01/04/2008  . Frequent headaches   . GERD 11/17/2010  . Hyperlipidemia   . Internal hemorrhoids with other complication [id number]  . PROSTATITIS, RECURRENT 01/04/2008  . Pulmonary embolism ([place])   . PVD 08/17/2010  . TOBACCO USE, QUIT 08/14/2009    Past Surgical History:  Procedure Laterality Date  . Right wrist fracture      There were no vitals filed for this visit.   Subjective Assessment - 12/07/20 1348    Subjective Pt. indicated soreness in arm, nothing more specific reported today upon arrival to clinic.    Limitations Lifting;House hold activities    Patient Stated Goals Improve arm movement    Currently in Pain? Other (Comment)   soreness   Pain Location Shoulder    Pain Orientation Left    Pain Descriptors / Indicators Aching;Sore    Pain Type Chronic pain    Aggravating Factors  insidious worsening, lifting arm                             [facility] Adult PT Treatment/Exercise - 12/07/20 0001      Shoulder Exercises: Supine   Other Supine Exercises wand flexion 1 lb bar 2 x 10       Shoulder Exercises: Pulleys   Flexion 3 minutes    Scaption 3 minutes      Shoulder Exercises: Isometric Strengthening   Other Isometric Exercises flexion, er, ir, abd in 90 deg elbow bent at wall 5 sec x 10 each way      Manual Therapy   Manual therapy comments PROM stretching in all directions, g3 inferior jt mobs, ap mobs c MWM for ER in 45 deg abd                  PT Education - 12/07/20 1408    Education Details new HEP    Methods Explanation;Handout;Verbal cues;Demonstration    Comprehension Verbalized understanding;Returned demonstration            PT Short Term Goals - 11/30/20 1348      PT SHORT TERM GOAL #1   Title Patient will demonstrate independent use of home exercise program to maintain progress from in clinic treatments.    Time 3    Period Weeks    Status On-going    Target Date 12/08/20             PT Long Term Goals - 11/17/20 1013  PT LONG TERM GOAL #1   Title Patient will demonstrate/report pain at worst less than or equal to 2/10 to facilitate minimal limitation in daily activity secondary to pain symptoms.    Time 10    Period Weeks    Status New    Target Date 01/26/21      PT LONG TERM GOAL #2   Title Patient will demonstrate independent use of home exercise program to facilitate ability to maintain/progress functional gains from skilled physical therapy services.    Time 10    Period Weeks    Status New    Target Date 01/26/21      PT LONG TERM GOAL #3   Title Pt. will demonstrate Lt GH AROM flexion >120, abd > 120, ER > 60 to facilitate ability to perform usual self care, dressing and gardening activity at PLOF.    Time 10    Period Weeks    Status New    Target Date 01/26/21      PT LONG TERM GOAL #4   Title Pt. will demonstrate Lt UE MMT > or = 4/5 to facilitate ability to perform lifting, carrying in household and self care activity at PLOF.    Time 10    Period Weeks    Status New    Target Date 01/26/21       PT LONG TERM GOAL #5   Title Pt. will demonstrate FOTO outcome > or = 62 to facilitate reduced disability secondary to symptoms.    Time 10    Period Weeks    Status New    Target Date 01/26/21                 Plan - 12/07/20 1405    Clinical Impression Statement Continued improvement in mobility c repetitive passive/aarom motion today in clinic.  Weakness evident in Lt UE/shoulder c any AAROM performance and continued skilled PT services indicated to help improve.    Personal Factors and Comorbidities Comorbidity 2    Comorbidities GERD, hyperlipidemia    Examination-Activity Limitations Bathing;Sleep;Carry;Lift;Hygiene/Grooming;Dressing;Reach Overhead    Examination-Participation Restrictions Art gallery manager;Yard Work    Stability/Clinical Decision Making Stable/Uncomplicated    Rehab Potential Fair    PT Frequency 2x / week    PT Duration Other (comment)   10 weeks   PT Treatment/Interventions ADLs/Self Care Home Management;Cryotherapy;Electrical Stimulation;Iontophoresis 4mg /ml Dexamethasone;Moist Heat;Balance training;Therapeutic exercise;Therapeutic activities;Functional mobility training;Stair training;Gait training;DME Instruction;Ultrasound;Neuromuscular re-education;Patient/family education;Manual techniques;Taping;Dry needling;Passive range of motion;Spinal Manipulations;Joint Manipulations    PT Next Visit Plan Progress mobility c manual and ther ex in all directions, active assisted movement in gravity reduced positioning, isometrics    PT Home Exercise Plan [id number]    Consulted and Agree with Plan of Care Patient           Patient will benefit from skilled therapeutic intervention in order to improve the following deficits and impairments:  Decreased endurance,Hypomobility,Decreased activity tolerance,Decreased strength,Impaired UE functional use,Pain,Decreased mobility,Decreased range of motion,Impaired perceived functional ability,Improper body  mechanics,Postural dysfunction,Impaired flexibility,Decreased coordination  Visit Diagnosis: Chronic left shoulder pain  Muscle weakness (generalized)  Stiffness of left shoulder, not elsewhere classified  Abnormal posture     Problem List Patient Active Problem List   Diagnosis Date Noted  . Adhesive capsulitis of left shoulder 10/27/2020  . Rotator cuff syndrome of left shoulder 10/27/2020  . ED (erectile dysfunction) 03/23/2020  . 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 Dyslipidemia 08/22/2012  . GERD 11/17/2010  . PVD 08/17/2010  .  OTHER SYMPTOMS INVOLVING CARDIOVASCULAR SYSTEM 07/01/2010  . NECK PAIN 06/30/2010  . TOBACCO USE, QUIT 08/14/2009  . ABDOMINAL PAIN 11/14/2008  . COLONIC POLYPS, HX OF 08/14/2008  . BPH associated with nocturia 02/13/2008  . INTERNAL HEMORRHOIDS WITH OTHER COMPLICATION 95/28/4132  . Diverticulosis of colon (without mention of hemorrhage) 01/04/2008  . PROSTATITIS, RECURRENT 01/04/2008    Scot Jun, PT, DPT, OCS, ATC 12/07/20  2:20 PM    [facility] [address] [place], [place], [address] Phone: [phone]   Fax:  [phone]  Name: Balen Woolum MRN: [id number] Date of Birth: 1931-04-12

## 2020-12-14 ENCOUNTER — Encounter: Payer: Self-pay | Admitting: Rehabilitative and Restorative Service Providers"

## 2020-12-14 ENCOUNTER — Other Ambulatory Visit: Payer: Self-pay

## 2020-12-14 ENCOUNTER — Ambulatory Visit (INDEPENDENT_AMBULATORY_CARE_PROVIDER_SITE_OTHER): Payer: Medicare Other | Admitting: Rehabilitative and Restorative Service Providers"

## 2020-12-14 DIAGNOSIS — M25612 Stiffness of left shoulder, not elsewhere classified: Secondary | ICD-10-CM | POA: Diagnosis not present

## 2020-12-14 DIAGNOSIS — M6281 Muscle weakness (generalized): Secondary | ICD-10-CM

## 2020-12-14 DIAGNOSIS — R293 Abnormal posture: Secondary | ICD-10-CM | POA: Diagnosis not present

## 2020-12-14 DIAGNOSIS — G8929 Other chronic pain: Secondary | ICD-10-CM

## 2020-12-14 DIAGNOSIS — M25512 Pain in left shoulder: Secondary | ICD-10-CM

## 2020-12-14 NOTE — Therapy (Signed)
[place], [place], [address] Phone: [phone]   Fax:  [phone]  Physical Therapy Treatment  Patient Details  Name: Connor Neal MRN: [id number] Date of Birth: 11/21/1930 Referring Provider (PT): Dr. Erlinda Hong   Encounter Date: 12/14/2020   PT End of Session - 12/14/20 1400    Visit Number 4    Number of Visits 16    Date for PT Re-Evaluation 01/26/21    Authorization Type Medicare    Progress Note Due on Visit 10    PT Start Time 1346    PT Stop Time 1425    PT Time Calculation (min) 39 min    Activity Tolerance Patient tolerated treatment well    Behavior During Therapy [facility] for tasks assessed/performed           Past Medical History:  Diagnosis Date  . Arthritis   . BENIGN PROSTATIC HYPERTROPHY 02/13/2008  . COLONIC POLYPS 01/04/2008  . Diverticulosis of colon (without mention of hemorrhage) 01/04/2008  . Frequent headaches   . GERD 11/17/2010  . Hyperlipidemia   . Internal hemorrhoids with other complication 99991111  . PROSTATITIS, RECURRENT 01/04/2008  . Pulmonary embolism ([place])   . PVD 08/17/2010  . TOBACCO USE, QUIT 08/14/2009    Past Surgical History:  Procedure Laterality Date  . Right wrist fracture      There were no vitals filed for this visit.   Subjective Assessment - 12/14/20 1357    Subjective Pt. stated he still has trouble getting arm up overhead.  Reported Lt knee pain and swelling as well today.    Limitations Lifting;House hold activities    Patient Stated Goals Improve arm movement    Currently in Pain? No/denies   no pain at rest in arm   Pain Location Shoulder    Pain Orientation Left    Pain Type Chronic pain    Aggravating Factors  overhead reaching limited.                             [place] Adult PT Treatment/Exercise - 12/14/20 0001      Neuro Re-ed    Neuro Re-ed Details  Rhythmic stabilizations in 100 deg flexion      Shoulder Exercises:  Supine   Other Supine Exercises supine flexion AROM 2 x 8    Other Supine Exercises supine 90 deg flexion hold to fatigue      Shoulder Exercises: Pulleys   Flexion 3 minutes    Scaption 3 minutes    Other Pulley Exercises 5 sec holds in pulleys      Shoulder Exercises: ROM/Strengthening   UBE (Upper Arm Bike) Lvl 2.5 5 mins fwd, 3 mins backward      Manual Therapy   Manual therapy comments PROM stretching in all directions, g3 inferior jt mobs, ap mobs c MWM for ER in 45 deg abd                    PT Short Term Goals - 11/30/20 1348      PT SHORT TERM GOAL #1   Title Patient will demonstrate independent use of home exercise program to maintain progress from in clinic treatments.    Time 3    Period Weeks    Status On-going    Target Date 12/08/20             PT Long Term Goals - 11/17/20 1013  PT LONG TERM GOAL #1   Title Patient will demonstrate/report pain at worst less than or equal to 2/10 to facilitate minimal limitation in daily activity secondary to pain symptoms.    Time 10    Period Weeks    Status New    Target Date 01/26/21      PT LONG TERM GOAL #2   Title Patient will demonstrate independent use of home exercise program to facilitate ability to maintain/progress functional gains from skilled physical therapy services.    Time 10    Period Weeks    Status New    Target Date 01/26/21      PT LONG TERM GOAL #3   Title Pt. will demonstrate Lt GH AROM flexion >120, abd > 120, ER > 60 to facilitate ability to perform usual self care, dressing and gardening activity at PLOF.    Time 10    Period Weeks    Status New    Target Date 01/26/21      PT LONG TERM GOAL #4   Title Pt. will demonstrate Lt UE MMT > or = 4/5 to facilitate ability to perform lifting, carrying in household and self care activity at PLOF.    Time 10    Period Weeks    Status New    Target Date 01/26/21      PT LONG TERM GOAL #5   Title Pt. will demonstrate FOTO  outcome > or = 62 to facilitate reduced disability secondary to symptoms.    Time 10    Period Weeks    Status New    Target Date 01/26/21                 Plan - 12/14/20 1405    Clinical Impression Statement Elevation attempts against gravity to appox. 100 deg c compensatory shrug and thoracic/lumbar extension aberrant movemnets noted.  Improving slowly in gravity reduced AROM with end range still limited capsularly and myofascially at this time.    Personal Factors and Comorbidities Comorbidity 2    Comorbidities GERD, hyperlipidemia    Examination-Activity Limitations Bathing;Sleep;Carry;Lift;Hygiene/Grooming;Dressing;Reach Overhead    Examination-Participation Restrictions Art gallery manager;Yard Work    Stability/Clinical Decision Making Stable/Uncomplicated    Rehab Potential Fair    PT Frequency 2x / week    PT Duration Other (comment)   10 weeks   PT Treatment/Interventions ADLs/Self Care Home Management;Cryotherapy;Electrical Stimulation;Iontophoresis 4mg /ml Dexamethasone;Moist Heat;Balance training;Therapeutic exercise;Therapeutic activities;Functional mobility training;Stair training;Gait training;DME Instruction;Ultrasound;Neuromuscular re-education;Patient/family education;Manual techniques;Taping;Dry needling;Passive range of motion;Spinal Manipulations;Joint Manipulations    PT Next Visit Plan Progress mobility c manual and ther ex in all directions, active assisted movement in gravity reduced positioning, isometrics    PT Home Exercise Plan [id number]    Recommended Other Services Pulley at home could be beneficial    Consulted and Agree with Plan of Care Patient           Patient will benefit from skilled therapeutic intervention in order to improve the following deficits and impairments:  Decreased endurance,Hypomobility,Decreased activity tolerance,Decreased strength,Impaired UE functional use,Pain,Decreased mobility,Decreased range of motion,Impaired  perceived functional ability,Improper body mechanics,Postural dysfunction,Impaired flexibility,Decreased coordination  Visit Diagnosis: Chronic left shoulder pain  Muscle weakness (generalized)  Stiffness of left shoulder, not elsewhere classified  Abnormal posture     Problem List Patient Active Problem List   Diagnosis Date Noted  . Adhesive capsulitis of left shoulder 10/27/2020  . Rotator cuff syndrome of left shoulder 10/27/2020  . ED (erectile dysfunction) 03/23/2020  . Embolism, pulmonary with  infarction ([place]) 04/10/2015  . Dyslipidemia 08/22/2012  . GERD 11/17/2010  . PVD 08/17/2010  . OTHER SYMPTOMS INVOLVING CARDIOVASCULAR SYSTEM 07/01/2010  . NECK PAIN 06/30/2010  . TOBACCO USE, QUIT 08/14/2009  . ABDOMINAL PAIN 11/14/2008  . COLONIC POLYPS, HX OF 08/14/2008  . BPH associated with nocturia 02/13/2008  . INTERNAL HEMORRHOIDS WITH OTHER COMPLICATION 86/76/7209  . Diverticulosis of colon (without mention of hemorrhage) 01/04/2008  . PROSTATITIS, RECURRENT 01/04/2008   Scot Jun, PT, DPT, OCS, ATC 12/14/20  2:24 PM    [facility] [address] [place], [place], [address] Phone: [phone]   Fax:  [phone]  Name: Connor Neal MRN: [id number] Date of Birth: 10-08-1931

## 2020-12-21 ENCOUNTER — Ambulatory Visit (INDEPENDENT_AMBULATORY_CARE_PROVIDER_SITE_OTHER): Payer: Medicare Other | Admitting: Rehabilitative and Restorative Service Providers"

## 2020-12-21 ENCOUNTER — Other Ambulatory Visit: Payer: Self-pay

## 2020-12-21 ENCOUNTER — Encounter: Payer: Self-pay | Admitting: Rehabilitative and Restorative Service Providers"

## 2020-12-21 DIAGNOSIS — R293 Abnormal posture: Secondary | ICD-10-CM

## 2020-12-21 DIAGNOSIS — M25612 Stiffness of left shoulder, not elsewhere classified: Secondary | ICD-10-CM | POA: Diagnosis not present

## 2020-12-21 DIAGNOSIS — M25512 Pain in left shoulder: Secondary | ICD-10-CM | POA: Diagnosis not present

## 2020-12-21 DIAGNOSIS — M6281 Muscle weakness (generalized): Secondary | ICD-10-CM | POA: Diagnosis not present

## 2020-12-21 DIAGNOSIS — G8929 Other chronic pain: Secondary | ICD-10-CM

## 2020-12-21 NOTE — Therapy (Signed)
[place], [place], [address] Phone: [phone]   Fax:  [phone]  Physical Therapy Treatment  Patient Details  Name: Connor Neal MRN: [id number] Date of Birth: January 23, 1931 Referring Provider (PT): Dr. Erlinda Hong   Encounter Date: 12/21/2020   PT End of Session - 12/21/20 1353    Visit Number 5    Number of Visits 16    Date for PT Re-Evaluation 01/26/21    Authorization Type Medicare    Progress Note Due on Visit 10    PT Start Time 6384    PT Stop Time 1424    PT Time Calculation (min) 39 min    Activity Tolerance Patient tolerated treatment well    Behavior During Therapy [facility] for tasks assessed/performed           Past Medical History:  Diagnosis Date  . Arthritis   . BENIGN PROSTATIC HYPERTROPHY 02/13/2008  . COLONIC POLYPS 01/04/2008  . Diverticulosis of colon (without mention of hemorrhage) 01/04/2008  . Frequent headaches   . GERD 11/17/2010  . Hyperlipidemia   . Internal hemorrhoids with other complication 5/36/4680  . PROSTATITIS, RECURRENT 01/04/2008  . Pulmonary embolism ([place])   . PVD 08/17/2010  . TOBACCO USE, QUIT 08/14/2009    Past Surgical History:  Procedure Laterality Date  . Right wrist fracture      There were no vitals filed for this visit.   Subjective Assessment - 12/21/20 1351    Subjective Pt. indicated no specific pain at rest today.  Pt. stated he feels he may be a little better overall.    Limitations Lifting;House hold activities    Patient Stated Goals Improve arm movement    Currently in Pain? No/denies   no pain at rest   Pain Location Shoulder    Pain Orientation Left    Pain Descriptors / Indicators Aching;Sore    Pain Type Chronic pain    Aggravating Factors  reaching up limited/painful    Pain Relieving Factors rest              [facility] PT Assessment - 12/21/20 0001      Assessment   Medical Diagnosis Adhesive capsulitis, RTC Lt shoulder    Referring  Provider (PT) Dr. Erlinda Hong    Onset Date/Surgical Date 08/26/20    Hand Dominance Right      AROM   Left Shoulder Flexion 128 Degrees   end range discomfort   Left Shoulder ABduction 100 Degrees    Left Shoulder External Rotation 30 Degrees   end range pain     PROM   Left Shoulder Flexion 130 Degrees    Left Shoulder ABduction 105 Degrees                         [facility] Adult PT Treatment/Exercise - 12/21/20 0001      Neuro Re-ed    Neuro Re-ed Details  Rhythmic stabilizations in 100 deg flexion moderate resistance      Shoulder Exercises: Supine   Flexion Both   to fatigue 2 sets ( x 12, x 20)     Shoulder Exercises: Pulleys   Flexion 3 minutes    ABduction 3 minutes    Other Pulley Exercises 5 sec holds      Shoulder Exercises: ROM/Strengthening   UBE (Upper Arm Bike) Lvl 3.5 3 mins fwd/back each way      Manual Therapy   Manual therapy comments PROM end range  MET for IR/ER in 45 deg abd, MWM c ER c ap sustained mob Lt GH jt, G4 inferior jt mobs in end range flexion, scaption                    PT Short Term Goals - 11/30/20 1348      PT SHORT TERM GOAL #1   Title Patient will demonstrate independent use of home exercise program to maintain progress from in clinic treatments.    Time 3    Period Weeks    Status On-going    Target Date 12/08/20             PT Long Term Goals - 12/21/20 1421      PT LONG TERM GOAL #1   Title Patient will demonstrate/report pain at worst less than or equal to 2/10 to facilitate minimal limitation in daily activity secondary to pain symptoms.    Time 10    Period Weeks    Status On-going      PT LONG TERM GOAL #2   Title Patient will demonstrate independent use of home exercise program to facilitate ability to maintain/progress functional gains from skilled physical therapy services.    Time 10    Period Weeks    Status On-going      PT LONG TERM GOAL #3   Title Pt. will demonstrate Lt GH AROM flexion  >120, abd > 120, ER > 60 to facilitate ability to perform usual self care, dressing and gardening activity at PLOF.    Time 10    Period Weeks    Status Partially Met      PT LONG TERM GOAL #4   Title Pt. will demonstrate Lt UE MMT > or = 4/5 to facilitate ability to perform lifting, carrying in household and self care activity at PLOF.    Time 10    Period Weeks    Status On-going      PT LONG TERM GOAL #5   Title Pt. will demonstrate FOTO outcome > or = 62 to facilitate reduced disability secondary to symptoms.    Time 10    Period Weeks    Status On-going                 Plan - 12/21/20 1412    Clinical Impression Statement AROM measurements in supine improved steadily in all directions compared to evaluation c reduced end range symptoms.  Noted strength deficits still evident and restricting daily use and functional gains overall.  Slow progression at this time in progressive active ROM c limits in gravity resisted movements.  Continued skilled PT services indicated at this time.    Personal Factors and Comorbidities Comorbidity 2    Comorbidities GERD, hyperlipidemia    Examination-Activity Limitations Bathing;Sleep;Carry;Lift;Hygiene/Grooming;Dressing;Reach Overhead    Examination-Participation Restrictions Art gallery manager;Yard Work    Stability/Clinical Decision Making Stable/Uncomplicated    Rehab Potential Fair    PT Frequency 2x / week    PT Duration Other (comment)   10 weeks   PT Treatment/Interventions ADLs/Self Care Home Management;Cryotherapy;Electrical Stimulation;Iontophoresis 36m/ml Dexamethasone;Moist Heat;Balance training;Therapeutic exercise;Therapeutic activities;Functional mobility training;Stair training;Gait training;DME Instruction;Ultrasound;Neuromuscular re-education;Patient/family education;Manual techniques;Taping;Dry needling;Passive range of motion;Spinal Manipulations;Joint Manipulations    PT Next Visit Plan Continue to gain passive  and active movement in manual and ther ex end range stretching.  Continue supine and sidelying active movement progression as tolerated.  Maybe standing UE ranger/bar lift (avoid shrug)    PT Home Exercise Plan [id number]  Consulted and Agree with Plan of Care Patient           Patient will benefit from skilled therapeutic intervention in order to improve the following deficits and impairments:  Decreased endurance,Hypomobility,Decreased activity tolerance,Decreased strength,Impaired UE functional use,Pain,Decreased mobility,Decreased range of motion,Impaired perceived functional ability,Improper body mechanics,Postural dysfunction,Impaired flexibility,Decreased coordination  Visit Diagnosis: Chronic left shoulder pain  Muscle weakness (generalized)  Stiffness of left shoulder, not elsewhere classified  Abnormal posture     Problem List Patient Active Problem List   Diagnosis Date Noted  . Adhesive capsulitis of left shoulder 10/27/2020  . Rotator cuff syndrome of left shoulder 10/27/2020  . ED (erectile dysfunction) 03/23/2020  . 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 Dyslipidemia 08/22/2012  . GERD 11/17/2010  . PVD 08/17/2010  . OTHER SYMPTOMS INVOLVING CARDIOVASCULAR SYSTEM 07/01/2010  . NECK PAIN 06/30/2010  . TOBACCO USE, QUIT 08/14/2009  . ABDOMINAL PAIN 11/14/2008  . COLONIC POLYPS, HX OF 08/14/2008  . BPH associated with nocturia 02/13/2008  . INTERNAL HEMORRHOIDS WITH OTHER COMPLICATION 49/17/9150  . Diverticulosis of colon (without mention of hemorrhage) 01/04/2008  . PROSTATITIS, RECURRENT 01/04/2008    Scot Jun, PT, DPT, OCS, ATC 12/21/20  2:22 PM    [facility] [address] [place], [place], [address] Phone: [phone]   Fax:  [phone]  Name: Connor Neal MRN: [id number] Date of Birth: 1931-09-17

## 2020-12-28 ENCOUNTER — Other Ambulatory Visit: Payer: Self-pay

## 2020-12-28 ENCOUNTER — Ambulatory Visit (INDEPENDENT_AMBULATORY_CARE_PROVIDER_SITE_OTHER): Payer: Medicare Other | Admitting: Rehabilitative and Restorative Service Providers"

## 2020-12-28 ENCOUNTER — Encounter: Payer: Self-pay | Admitting: Rehabilitative and Restorative Service Providers"

## 2020-12-28 DIAGNOSIS — M25512 Pain in left shoulder: Secondary | ICD-10-CM | POA: Diagnosis not present

## 2020-12-28 DIAGNOSIS — G8929 Other chronic pain: Secondary | ICD-10-CM

## 2020-12-28 DIAGNOSIS — R293 Abnormal posture: Secondary | ICD-10-CM

## 2020-12-28 DIAGNOSIS — M25612 Stiffness of left shoulder, not elsewhere classified: Secondary | ICD-10-CM | POA: Diagnosis not present

## 2020-12-28 DIAGNOSIS — M6281 Muscle weakness (generalized): Secondary | ICD-10-CM | POA: Diagnosis not present

## 2020-12-28 NOTE — Therapy (Signed)
[place], [place], [address] Phone: [phone]   Fax:  [phone]  Physical Therapy Treatment  Patient Details  Name: Connor Neal MRN: [id number] Date of Birth: 1931-09-11 Referring Provider (PT): Dr. Erlinda Hong   Encounter Date: 12/28/2020   PT End of Session - 12/28/20 1340    Visit Number 6    Number of Visits 16    Date for PT Re-Evaluation 01/26/21    Authorization Type Medicare    Progress Note Due on Visit 10    PT Start Time 1344    PT Stop Time 1426    PT Time Calculation (min) 42 min    Activity Tolerance Patient tolerated treatment well    Behavior During Therapy [facility] for tasks assessed/performed           Past Medical History:  Diagnosis Date  . Arthritis   . BENIGN PROSTATIC HYPERTROPHY 02/13/2008  . COLONIC POLYPS 01/04/2008  . Diverticulosis of colon (without mention of hemorrhage) 01/04/2008  . Frequent headaches   . GERD 11/17/2010  . Hyperlipidemia   . Internal hemorrhoids with other complication 8/45/3646  . PROSTATITIS, RECURRENT 01/04/2008  . Pulmonary embolism ([place])   . PVD 08/17/2010  . TOBACCO USE, QUIT 08/14/2009    Past Surgical History:  Procedure Laterality Date  . Right wrist fracture      There were no vitals filed for this visit.   Subjective Assessment - 12/28/20 1350    Subjective Pt. indicated a little bit improved.  No specific concerns indicated about shoulder, just having difficulty lifting still.    Limitations Lifting;House hold activities    Patient Stated Goals Improve arm movement    Currently in Pain? No/denies    Pain Score 0-No pain    Pain Location Shoulder    Pain Orientation Left                             [facility] Adult PT Treatment/Exercise - 12/28/20 0001      Shoulder Exercises: Supine   Flexion Both   to fatigue x20, x 20   Other Supine Exercises supine 2 lb wand flexion bilateral 2 x 10      Shoulder Exercises: Standing    Extension Both;Theraband   3 x 10 green   Row Theraband;Both   3 x10 green     Shoulder Exercises: Pulleys   Flexion 3 minutes    ABduction 3 minutes    Other Pulley Exercises 5 sec holds      Shoulder Exercises: ROM/Strengthening   UBE (Upper Arm Bike) Lvl 3.5 3 mins fwd, 4 mins reverse                    PT Short Term Goals - 11/30/20 1348      PT SHORT TERM GOAL #1   Title Patient will demonstrate independent use of home exercise program to maintain progress from in clinic treatments.    Time 3    Period Weeks    Status On-going    Target Date 12/08/20             PT Long Term Goals - 12/21/20 1421      PT LONG TERM GOAL #1   Title Patient will demonstrate/report pain at worst less than or equal to 2/10 to facilitate minimal limitation in daily activity secondary to pain symptoms.    Time 10    Period  Weeks    Status On-going      PT LONG TERM GOAL #2   Title Patient will demonstrate independent use of home exercise program to facilitate ability to maintain/progress functional gains from skilled physical therapy services.    Time 10    Period Weeks    Status On-going      PT LONG TERM GOAL #3   Title Pt. will demonstrate Lt GH AROM flexion >120, abd > 120, ER > 60 to facilitate ability to perform usual self care, dressing and gardening activity at PLOF.    Time 10    Period Weeks    Status Partially Met      PT LONG TERM GOAL #4   Title Pt. will demonstrate Lt UE MMT > or = 4/5 to facilitate ability to perform lifting, carrying in household and self care activity at PLOF.    Time 10    Period Weeks    Status On-going      PT LONG TERM GOAL #5   Title Pt. will demonstrate FOTO outcome > or = 62 to facilitate reduced disability secondary to symptoms.    Time 10    Period Weeks    Status On-going                 Plan - 12/28/20 1423    Clinical Impression Statement Evident weakness still present c all directions c Lt UE but slowly but  steadily improving overall.  May continue to benefit from skilled PT and guidance in HEP to continue gains.    Personal Factors and Comorbidities Comorbidity 2    Comorbidities GERD, hyperlipidemia    Examination-Activity Limitations Bathing;Sleep;Carry;Lift;Hygiene/Grooming;Dressing;Reach Overhead    Examination-Participation Restrictions Art gallery manager;Yard Work    Stability/Clinical Decision Making Stable/Uncomplicated    Rehab Potential Fair    PT Frequency 2x / week    PT Duration Other (comment)   10 weeks   PT Treatment/Interventions ADLs/Self Care Home Management;Cryotherapy;Electrical Stimulation;Iontophoresis 33m/ml Dexamethasone;Moist Heat;Balance training;Therapeutic exercise;Therapeutic activities;Functional mobility training;Stair training;Gait training;DME Instruction;Ultrasound;Neuromuscular re-education;Patient/family education;Manual techniques;Taping;Dry needling;Passive range of motion;Spinal Manipulations;Joint Manipulations    PT Next Visit Plan Continue to gain passive and active movement in manual and ther ex end range stretching.  Continue supine and sidelying active movement progression as tolerated.    PT Home Exercise Plan [id number]   Consulted and Agree with Plan of Care Patient           Patient will benefit from skilled therapeutic intervention in order to improve the following deficits and impairments:  Decreased endurance,Hypomobility,Decreased activity tolerance,Decreased strength,Impaired UE functional use,Pain,Decreased mobility,Decreased range of motion,Impaired perceived functional ability,Improper body mechanics,Postural dysfunction,Impaired flexibility,Decreased coordination  Visit Diagnosis: Chronic left shoulder pain  Muscle weakness (generalized)  Stiffness of left shoulder, not elsewhere classified  Abnormal posture     Problem List Patient Active Problem List   Diagnosis Date Noted  . Adhesive capsulitis of left shoulder  10/27/2020  . Rotator cuff syndrome of left shoulder 10/27/2020  . ED (erectile dysfunction) 03/23/2020  . 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 Dyslipidemia 08/22/2012  . GERD 11/17/2010  . PVD 08/17/2010  . OTHER SYMPTOMS INVOLVING CARDIOVASCULAR SYSTEM 07/01/2010  . NECK PAIN 06/30/2010  . TOBACCO USE, QUIT 08/14/2009  . ABDOMINAL PAIN 11/14/2008  . COLONIC POLYPS, HX OF 08/14/2008  . BPH associated with nocturia 02/13/2008  . INTERNAL HEMORRHOIDS WITH OTHER COMPLICATION 033/54/5625 . Diverticulosis of colon (without mention of hemorrhage) 01/04/2008  . PROSTATITIS, RECURRENT 01/04/2008  Scot Jun, PT, DPT, OCS, ATC 12/28/20  2:30 PM    [facility] [address] [place], [place], [address] Phone: [phone]   Fax:  [phone]  Name: Connor Neal MRN: [id number] Date of Birth: 11/15/1930

## 2021-01-04 ENCOUNTER — Encounter: Payer: Self-pay | Admitting: Rehabilitative and Restorative Service Providers"

## 2021-01-04 ENCOUNTER — Ambulatory Visit (INDEPENDENT_AMBULATORY_CARE_PROVIDER_SITE_OTHER): Payer: Medicare Other | Admitting: Rehabilitative and Restorative Service Providers"

## 2021-01-04 ENCOUNTER — Other Ambulatory Visit: Payer: Self-pay

## 2021-01-04 DIAGNOSIS — M25612 Stiffness of left shoulder, not elsewhere classified: Secondary | ICD-10-CM

## 2021-01-04 DIAGNOSIS — M25512 Pain in left shoulder: Secondary | ICD-10-CM

## 2021-01-04 DIAGNOSIS — M6281 Muscle weakness (generalized): Secondary | ICD-10-CM | POA: Diagnosis not present

## 2021-01-04 DIAGNOSIS — R293 Abnormal posture: Secondary | ICD-10-CM | POA: Diagnosis not present

## 2021-01-04 DIAGNOSIS — G8929 Other chronic pain: Secondary | ICD-10-CM
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
## 2021-01-11 ENCOUNTER — Ambulatory Visit (INDEPENDENT_AMBULATORY_CARE_PROVIDER_SITE_OTHER): Payer: Medicare Other | Admitting: Rehabilitative and Restorative Service Providers"

## 2021-01-11 ENCOUNTER — Encounter: Payer: Self-pay | Admitting: Rehabilitative and Restorative Service Providers"

## 2021-01-11 ENCOUNTER — Other Ambulatory Visit: Payer: Self-pay

## 2021-01-11 DIAGNOSIS — M6281 Muscle weakness (generalized): Secondary | ICD-10-CM

## 2021-01-11 DIAGNOSIS — M25512 Pain in left shoulder: Secondary | ICD-10-CM | POA: Diagnosis not present

## 2021-01-11 DIAGNOSIS — G8929 Other chronic pain: Secondary | ICD-10-CM

## 2021-01-11 DIAGNOSIS — M25612 Stiffness of left shoulder, not elsewhere classified: Secondary | ICD-10-CM | POA: Diagnosis not present

## 2021-01-11 DIAGNOSIS — R293 Abnormal posture: Secondary | ICD-10-CM | POA: Diagnosis not present

## 2021-01-11 NOTE — Therapy (Signed)
[place], [place], [address] Phone: [phone]   Fax:  [phone]  Physical Therapy Treatment  Patient Details  Name: Connor Neal MRN: [id number] Date of Birth: [birth year] Referring Provider (PT): Dr. Erlinda Hong   Encounter Date: 01/11/2021   PT End of Session - 01/11/21 1353    Visit Number 8    Number of Visits 16    Date for PT Re-Evaluation 01/26/21    Authorization Type Medicare    Progress Note Due on Visit 10    PT Start Time 7035    PT Stop Time 1425    PT Time Calculation (min) 40 min    Activity Tolerance Patient tolerated treatment well    Behavior During Therapy [facility] for tasks assessed/performed           Past Medical History:  Diagnosis Date  . Arthritis   . BENIGN PROSTATIC HYPERTROPHY 02/13/2008  . COLONIC POLYPS 01/04/2008  . Diverticulosis of colon (without mention of hemorrhage) 01/04/2008  . Frequent headaches   . GERD 11/17/2010  . Hyperlipidemia   . Internal hemorrhoids with other complication 0/07/3817  . PROSTATITIS, RECURRENT 01/04/2008  . Pulmonary embolism ([place])   . PVD 08/17/2010  . TOBACCO USE, QUIT 08/14/2009    Past Surgical History:  Procedure Laterality Date  . Right wrist fracture      There were no vitals filed for this visit.   Subjective Assessment - 01/11/21 1352    Subjective Pt. indicated no specific complaints of pain today.  Pt. stated he had a couple more visits scheduled.  He overall reported he was doing some better.    Limitations Lifting;House hold activities    Patient Stated Goals Improve arm movement    Currently in Pain? No/denies    Pain Score 0-No pain              [facility] PT Assessment - 01/11/21 0001      Assessment   Medical Diagnosis Adhesive capsulitis, RTC Lt shoulder    Referring Provider (PT) Dr. Erlinda Hong    Onset Date/Surgical Date 08/26/20    Hand Dominance Right      AROM   Left Shoulder Flexion 130 Degrees    Left Shoulder ABduction  110 Degrees    Left Shoulder Internal Rotation 70 Degrees    Left Shoulder External Rotation 40 Degrees      PROM   Left Shoulder Flexion 142 Degrees    Left Shoulder ABduction 122 Degrees                         [facility] Adult PT Treatment/Exercise - 01/11/21 0001      Neuro Re-ed    Neuro Re-ed Details  Rhythmic stabilizations 100 deg flexion 30 sec bouts      Shoulder Exercises: Supine   Flexion Left   in 20 deg bed elevation to fatigue x20, x20     Shoulder Exercises: Pulleys   Flexion 3 minutes    ABduction 3 minutes    Other Pulley Exercises 5 sec holds      Shoulder Exercises: ROM/Strengthening   UBE (Upper Arm Bike) Lvl 3.5 4 mins fwd/back each way      Manual Therapy   Manual therapy comments end range flexion, scaption stretching.  Ap mobs Lt [place] jt                    PT Short Term Goals - 01/11/21  [place] #1   Title Patient will demonstrate independent use of home exercise program to maintain progress from in clinic treatments.    Time 3    Period Weeks    Status Achieved             PT Long Term Goals - 01/11/21 1413      PT LONG TERM GOAL #1   Title Patient will demonstrate/report pain at worst less than or equal to 2/10 to facilitate minimal limitation in daily activity secondary to pain symptoms.    Time 10    Period Weeks    Status On-going    Target Date 01/26/21      PT LONG TERM GOAL #2   Title Patient will demonstrate independent use of home exercise program to facilitate ability to maintain/progress functional gains from skilled physical therapy services.    Time 10    Period Weeks    Status On-going    Target Date 01/26/21      PT LONG TERM GOAL #3   Title Pt. will demonstrate Lt GH AROM flexion >120, abd > 120, ER > 60 to facilitate ability to perform usual self care, dressing and gardening activity at PLOF.    Time 10    Period Weeks    Status Partially Met    Target Date 01/26/21       PT LONG TERM GOAL #4   Title Pt. will demonstrate Lt UE MMT > or = 4/5 to facilitate ability to perform lifting, carrying in household and self care activity at PLOF.    Time 10    Period Weeks    Status On-going    Target Date 01/26/21      PT LONG TERM GOAL #5   Title Pt. will demonstrate FOTO outcome > or = 62 to facilitate reduced disability secondary to symptoms.    Time 10    Period Weeks    Status On-going    Target Date 01/26/21                 Plan - 01/11/21 1414    Clinical Impression Statement Active and passive movement measurement update provided today, showing some improvement in active movement when measured in supine.  Progress in mobility noted.  Elevation attempts against gravity still showing marked deficits compared to 5/5 possible strength testing but improvement expectations to be adjusted based off presentation.  Continued skilled PT services c focus on muscle strength gains in anterior deltoid to improve flexion as well as end range mobilty gains indicated at this time.    Personal Factors and Comorbidities Comorbidity 2    Comorbidities GERD, hyperlipidemia    Examination-Activity Limitations Bathing;Sleep;Carry;Lift;Hygiene/Grooming;Dressing;Reach Overhead    Examination-Participation Restrictions Art gallery manager;Yard Work    Stability/Clinical Decision Making Stable/Uncomplicated    Rehab Potential Fair    PT Frequency 2x / week    PT Duration Other (comment)   10 weeks   PT Treatment/Interventions ADLs/Self Care Home Management;Cryotherapy;Electrical Stimulation;Iontophoresis 70m/ml Dexamethasone;Moist Heat;Balance training;Therapeutic exercise;Therapeutic activities;Functional mobility training;Stair training;Gait training;DME Instruction;Ultrasound;Neuromuscular re-education;Patient/family education;Manual techniques;Taping;Dry needling;Passive range of motion;Spinal Manipulations;Joint Manipulations    PT Next Visit Plan Continue supine   (bed elevation as tolerated) and sidelying active movement progression as tolerated.  Not appropriate for gravity resistance due to compensatory movements.    PT Home Exercise Plan [email or web address]   Consulted and Agree with Plan of Care Patient  Patient will benefit from skilled therapeutic intervention in order to improve the following deficits and impairments:  Decreased endurance,Hypomobility,Decreased activity tolerance,Decreased strength,Impaired UE functional use,Pain,Decreased mobility,Decreased range of motion,Impaired perceived functional ability,Improper body mechanics,Postural dysfunction,Impaired flexibility,Decreased coordination  Visit Diagnosis: Chronic left shoulder pain  Muscle weakness (generalized)  Stiffness of left shoulder, not elsewhere classified  Abnormal posture     Problem List Patient Active Problem List   Diagnosis Date Noted  . Adhesive capsulitis of left shoulder 10/27/2020  . Rotator cuff syndrome of left shoulder 10/27/2020  . ED (erectile dysfunction) 03/23/2020  . 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 Dyslipidemia 08/22/2012  . GERD 11/17/2010  . PVD 08/17/2010  . OTHER SYMPTOMS INVOLVING CARDIOVASCULAR SYSTEM 07/01/2010  . NECK PAIN 06/30/2010  . TOBACCO USE, QUIT 08/14/2009  . ABDOMINAL PAIN 11/14/2008  . COLONIC POLYPS, HX OF 08/14/2008  . BPH associated with nocturia 02/13/2008  . INTERNAL HEMORRHOIDS WITH OTHER COMPLICATION 32/41/9914  . Diverticulosis of colon (without mention of hemorrhage) 01/04/2008  . PROSTATITIS, RECURRENT 01/04/2008   Scot Jun, PT, DPT, OCS, ATC 01/11/21  2:25 PM    [facility] [address] [place], [place], [address] Phone: [phone]   Fax:  [phone]  Name: Connor Neal MRN: [id number] Date of Birth: 1931/11/07

## 2021-01-18 ENCOUNTER — Encounter: Payer: Self-pay | Admitting: Rehabilitative and Restorative Service Providers"

## 2021-01-18 ENCOUNTER — Ambulatory Visit (INDEPENDENT_AMBULATORY_CARE_PROVIDER_SITE_OTHER): Payer: Medicare Other | Admitting: Rehabilitative and Restorative Service Providers"

## 2021-01-18 ENCOUNTER — Other Ambulatory Visit: Payer: Self-pay

## 2021-01-18 DIAGNOSIS — G8929 Other chronic pain: Secondary | ICD-10-CM

## 2021-01-18 DIAGNOSIS — M25612 Stiffness of left shoulder, not elsewhere classified: Secondary | ICD-10-CM | POA: Diagnosis not present

## 2021-01-18 DIAGNOSIS — R293 Abnormal posture: Secondary | ICD-10-CM | POA: Diagnosis not present

## 2021-01-18 DIAGNOSIS — M6281 Muscle weakness (generalized): Secondary | ICD-10-CM | POA: Diagnosis not present

## 2021-01-18 DIAGNOSIS — M25512 Pain in left shoulder: Secondary | ICD-10-CM | POA: Diagnosis not present

## 2021-01-18 NOTE — Therapy (Signed)
[facility] [place], [place], [address] Phone: [phone]   Fax:  [phone]  Physical Therapy Treatment  Patient Details  Name: Connor Neal MRN: [id number] Date of Birth: 1931-03-28 Referring Provider (PT): Dr. Erlinda Hong   Encounter Date: 01/18/2021   PT End of Session - 01/18/21 1352    Visit Number 9    Number of Visits 16    Date for PT Re-Evaluation 01/26/21    Authorization Type Medicare    Progress Note Due on Visit 10    PT Start Time 0630    PT Stop Time 1424    PT Time Calculation (min) 39 min    Activity Tolerance Patient tolerated treatment well    Behavior During Therapy [facility] for tasks assessed/performed           Past Medical History:  Diagnosis Date  . Arthritis   . BENIGN PROSTATIC HYPERTROPHY 02/13/2008  . COLONIC POLYPS 01/04/2008  . Diverticulosis of colon (without mention of hemorrhage) 01/04/2008  . Frequent headaches   . GERD 11/17/2010  . Hyperlipidemia   . Internal hemorrhoids with other complication 1/60/1093  . PROSTATITIS, RECURRENT 01/04/2008  . Pulmonary embolism ([place])   . PVD 08/17/2010  . TOBACCO USE, QUIT 08/14/2009    Past Surgical History:  Procedure Laterality Date  . Right wrist fracture      There were no vitals filed for this visit.   Subjective Assessment - 01/18/21 1352    Subjective Pt. indicated he didn't have any complaints of pain today.    Limitations Lifting;House hold activities    Patient Stated Goals Improve arm movement    Currently in Pain? No/denies    Pain Score 0-No pain                             [facility] Adult PT Treatment/Exercise - 01/18/21 0001      Shoulder Exercises: Supine   Horizontal ABduction Left   2 x 10   Flexion Left   25 deg bed elevation, to fatigue x40, 1 lb   Other Supine Exercises supine 25 deg bed elevation 100 deg circles cw, ccw 1 lb 30x each way      Shoulder Exercises: Standing   Extension Both;Theraband;20  reps    Theraband Level (Shoulder Extension) Level 3 (Green)    [employer];Both   20x   Theraband Level (Shoulder Row) Level 3 (Green)      Shoulder Exercises: Pulleys   Flexion 3 minutes    ABduction 3 minutes    Other Pulley Exercises 5 sec holds                    PT Short Term Goals - 01/11/21 1352      PT SHORT TERM GOAL #1   Title Patient will demonstrate independent use of home exercise program to maintain progress from in clinic treatments.    Time 3    Period Weeks    Status Achieved             PT Long Term Goals - 01/11/21 1413      PT LONG TERM GOAL #1   Title Patient will demonstrate/report pain at worst less than or equal to 2/10 to facilitate minimal limitation in daily activity secondary to pain symptoms.    Time 10    Period Weeks    Status On-going    Target Date 01/26/21  PT LONG TERM GOAL #2   Title Patient will demonstrate independent use of home exercise program to facilitate ability to maintain/progress functional gains from skilled physical therapy services.    Time 10    Period Weeks    Status On-going    Target Date 01/26/21      PT LONG TERM GOAL #3   Title Pt. will demonstrate Lt GH AROM flexion >120, abd > 120, ER > 60 to facilitate ability to perform usual self care, dressing and gardening activity at PLOF.    Time 10    Period Weeks    Status Partially Met    Target Date 01/26/21      PT LONG TERM GOAL #4   Title Pt. will demonstrate Lt UE MMT > or = 4/5 to facilitate ability to perform lifting, carrying in household and self care activity at PLOF.    Time 10    Period Weeks    Status On-going    Target Date 01/26/21      PT LONG TERM GOAL #5   Title Pt. will demonstrate FOTO outcome > or = 62 to facilitate reduced disability secondary to symptoms.    Time 10    Period Weeks    Status On-going    Target Date 01/26/21                 Plan - 01/18/21 1405    Clinical Impression Statement Pt. was able  to perform elevation against gravity to approx. shoulder height prior to compensatory movements.  Pt. also demonstrated ability to perform weighted supine elevation lift today for first time.    Personal Factors and Comorbidities Comorbidity 2    Comorbidities GERD, hyperlipidemia    Examination-Activity Limitations Bathing;Sleep;Carry;Lift;Hygiene/Grooming;Dressing;Reach Overhead    Examination-Participation Restrictions Art gallery manager;Yard Work    Stability/Clinical Decision Making Stable/Uncomplicated    Rehab Potential Fair    PT Frequency 2x / week    PT Duration Other (comment)   10 weeks   PT Treatment/Interventions ADLs/Self Care Home Management;Cryotherapy;Electrical Stimulation;Iontophoresis 21m/ml Dexamethasone;Moist Heat;Balance training;Therapeutic exercise;Therapeutic activities;Functional mobility training;Stair training;Gait training;DME Instruction;Ultrasound;Neuromuscular re-education;Patient/family education;Manual techniques;Taping;Dry needling;Passive range of motion;Spinal Manipulations;Joint Manipulations    PT Next Visit Plan Continue supine  (bed elevation as tolerated) and sidelying active movement progression as tolerated.  Not appropriate for gravity resistance due to compensatory movements.  10th visit PN next viist.    [place] Agree with Plan of Care Patient           Patient will benefit from skilled therapeutic intervention in order to improve the following deficits and impairments:  Decreased endurance,Hypomobility,Decreased activity tolerance,Decreased strength,Impaired UE functional use,Pain,Decreased mobility,Decreased range of motion,Impaired perceived functional ability,Improper body mechanics,Postural dysfunction,Impaired flexibility,Decreased coordination  Visit Diagnosis: Chronic left shoulder pain  Muscle weakness (generalized)  Stiffness of left shoulder, not elsewhere classified  Abnormal  posture     Problem List Patient Active Problem List   Diagnosis Date Noted  . Adhesive capsulitis of left shoulder 10/27/2020  . Rotator cuff syndrome of left shoulder 10/27/2020  . ED (erectile dysfunction) 03/23/2020  . Embolism, pulmonary with infarction ([place] 04/10/2015  . Dyslipidemia 08/22/2012  . GERD 11/17/2010  . PVD 08/17/2010  . OTHER SYMPTOMS INVOLVING CARDIOVASCULAR SYSTEM 07/01/2010  . NECK PAIN 06/30/2010  . TOBACCO USE, QUIT 08/14/2009  . ABDOMINAL PAIN 11/14/2008  . COLONIC POLYPS, HX OF 08/14/2008  . BPH associated with nocturia 02/13/2008  . INTERNAL HEMORRHOIDS WITH OTHER COMPLICATION  01/04/2008  . Diverticulosis of colon (without mention of hemorrhage) 01/04/2008  . PROSTATITIS, RECURRENT 01/04/2008   Scot Jun, PT, DPT, OCS, ATC 01/18/21  2:16 PM    [facility] [address] [place], [place], [address] Phone: [phone]   Fax:  [phone]  Name: Connor Neal MRN: [id number] Date of Birth: [birth year]

## 2021-01-25 ENCOUNTER — Encounter: Payer: Self-pay | Admitting: Rehabilitative and Restorative Service Providers"

## 2021-01-25 ENCOUNTER — Other Ambulatory Visit: Payer: Self-pay

## 2021-01-25 ENCOUNTER — Ambulatory Visit (INDEPENDENT_AMBULATORY_CARE_PROVIDER_SITE_OTHER): Payer: Medicare Other | Admitting: Rehabilitative and Restorative Service Providers"

## 2021-01-25 DIAGNOSIS — M25612 Stiffness of left shoulder, not elsewhere classified: Secondary | ICD-10-CM | POA: Diagnosis not present

## 2021-01-25 DIAGNOSIS — M6281 Muscle weakness (generalized): Secondary | ICD-10-CM | POA: Diagnosis not present

## 2021-01-25 DIAGNOSIS — M25512 Pain in left shoulder: Secondary | ICD-10-CM

## 2021-01-25 DIAGNOSIS — R293 Abnormal posture: Secondary | ICD-10-CM | POA: Diagnosis not present

## 2021-01-25 DIAGNOSIS — G8929 Other chronic pain: Secondary | ICD-10-CM

## 2021-01-25 NOTE — Patient Instructions (Signed)
Access Code: [email or web address] URL: [email or web address] Date: 01/25/2021 Prepared by: Scot Jun  Exercises Seated Scapular Retraction - 2 x daily - 7 x weekly - 2 sets - 10 reps - 5 hold Supine Shoulder Flexion Extension AAROM with Dowel - 2 x daily - 7 x weekly - 3 sets - 10 reps - 5 hold Supine Shoulder Abduction AAROM with Dowel - 2 x daily - 7 x weekly - 3 sets - 10 reps Isometric Shoulder External Rotation at Wall - 2 x daily - 7 x weekly - 1 sets - 15 reps - 5 hold Standing Isometric Shoulder Abduction with Doorway - Arm Bent - 2 x daily - 7 x weekly - 1 sets - 15 reps - 5 hold Supine Shoulder Flexion Extension Full Range AROM - 2 x daily - 7 x weekly - 2 sets Standing Isometric Shoulder Extension at Table - 2 x daily - 7 x weekly - 1 sets - 15 reps - 5 hold Sidelying Shoulder Abduction Palm Forward - 1 x daily - 7 x weekly - 3 sets - 10 reps

## 2021-01-25 NOTE — Therapy (Signed)
[facility] [address] [place], [place], [address] Phone: [phone]   Fax:  [phone]  Physical Therapy Discharge  Patient Details  Name: Connor Neal MRN: [id number] Date of Birth: 1930-12-23 Referring Provider (PT): Dr. Erlinda Hong   Encounter Date: 01/25/2021   PHYSICAL THERAPY DISCHARGE SUMMARY  Visits from Start of Care: 10  Current functional level related to goals / functional outcomes: See note   Remaining deficits: See note   Education / Equipment: HEP Plan: Patient agrees to discharge.  Patient goals were partially met. Patient is being discharged due to being pleased with the current functional level.  ?????        PT End of Session - 01/25/21 1349    Visit Number 10    Number of Visits 16    Date for PT Re-Evaluation 01/26/21    Authorization Type Medicare    PT Start Time 1345    PT Stop Time 1408    PT Time Calculation (min) 23 min    Activity Tolerance Patient tolerated treatment well    Behavior During Therapy WFL for tasks assessed/performed           Past Medical History:  Diagnosis Date  . Arthritis   . BENIGN PROSTATIC HYPERTROPHY 02/13/2008  . COLONIC POLYPS 01/04/2008  . Diverticulosis of colon (without mention of hemorrhage) 01/04/2008  . Frequent headaches   . GERD 11/17/2010  . Hyperlipidemia   . Internal hemorrhoids with other complication 4/98/2641  . PROSTATITIS, RECURRENT 01/04/2008  . Pulmonary embolism ([place])   . PVD 08/17/2010  . TOBACCO USE, QUIT 08/14/2009    Past Surgical History:  Procedure Laterality Date  . Right wrist fracture      There were no vitals filed for this visit.   Subjective Assessment - 01/25/21 1349    Subjective Pt. indicated similar feeling in arm, doing better.  Still not 100% but indicated he was ready for d/c.    Limitations Lifting;House hold activities    Patient Stated Goals Improve arm movement    Currently in Pain? No/denies               [facility] PT Assessment - 01/25/21 0001      Assessment   Medical Diagnosis Adhesive capsulitis, RTC Lt shoulder    Referring Provider (PT) Dr. Erlinda Hong    Onset Date/Surgical Date 08/26/20    Hand Dominance Right      Observation/Other Assessments   Focus on Therapeutic Outcomes (FOTO)  67%      AROM   Left Shoulder Flexion 130 Degrees    Left Shoulder ABduction 110 Degrees    Left Shoulder Internal Rotation 70 Degrees    Left Shoulder External Rotation 45 Degrees      Strength   Left Shoulder Flexion 3-/5    Left Shoulder ABduction 3-/5    Left Shoulder External Rotation 3+/5                         [facility] Adult PT Treatment/Exercise - 01/25/21 0001      Exercises   Other Exercises  HEP review, cues for techniques verbal and visual, tactile.  Reviewed HEP per handout provided in Pt. instruction section.  Answered questions about positioning.      Shoulder Exercises: ROM/Strengthening   UBE (Upper Arm Bike) Lvl 3.5 5 mins fwd/back each way                  PT Education -  01/25/21 1406    Education Details HEP review    Person(s) Educated Patient    Methods Explanation;Demonstration;Verbal cues;Handout    Comprehension Returned demonstration;Verbalized understanding            PT Short Term Goals - 01/11/21 1352      PT SHORT TERM GOAL #1   Title Patient will demonstrate independent use of home exercise program to maintain progress from in clinic treatments.    Time 3    Period Weeks    Status Achieved             PT Long Term Goals - 01/25/21 1408      PT LONG TERM GOAL #1   Title Patient will demonstrate/report pain at worst less than or equal to 2/10 to facilitate minimal limitation in daily activity secondary to pain symptoms.    Time 10    Period Weeks    Status Achieved      PT LONG TERM GOAL #2   Title Patient will demonstrate independent use of home exercise program to facilitate ability to maintain/progress functional gains from  skilled physical therapy services.    Time 10    Period Weeks    Status Achieved      PT LONG TERM GOAL #3   Title Pt. will demonstrate Lt GH AROM flexion >120, abd > 120, ER > 60 to facilitate ability to perform usual self care, dressing and gardening activity at PLOF.    Time 10    Period Weeks    Status Partially Met      PT LONG TERM GOAL #4   Title Pt. will demonstrate Lt UE MMT > or = 4/5 to facilitate ability to perform lifting, carrying in household and self care activity at PLOF.    Time 10    Period Weeks    Status Not Met      PT LONG TERM GOAL #5   Title Pt. will demonstrate FOTO outcome > or = 62 to facilitate reduced disability secondary to symptoms.    Time 10    Period Weeks    Status Achieved                 Plan - 01/25/21 1350    Clinical Impression Statement Pt. has attended 10 visits overall during course of treatment, reporting reduced pain complaints as documented.  See objective data for updated information for current presentation showing gains towards goals.  Pt. is appropriate at this time for d/c to long term HEP use for continued gains towards PLOF return.    Personal Factors and Comorbidities Comorbidity 2    Comorbidities GERD, hyperlipidemia    Examination-Activity Limitations Bathing;Sleep;Carry;Lift;Hygiene/Grooming;Dressing;Reach Overhead    Examination-Participation Restrictions Art gallery manager;Yard Work    Stability/Clinical Decision Making Stable/Uncomplicated    Rehab Potential Fair    PT Duration --   10 weeks   PT Treatment/Interventions ADLs/Self Care Home Management;Cryotherapy;Electrical Stimulation;Iontophoresis 49m/ml Dexamethasone;Moist Heat;Balance training;Therapeutic exercise;Therapeutic activities;Functional mobility training;Stair training;Gait training;DME Instruction;Ultrasound;Neuromuscular re-education;Patient/family education;Manual techniques;Taping;Dry needling;Passive range of motion;Spinal  Manipulations;Joint Manipulations    PT Next Visit Plan D/C to HEP for continued gains.    PT Home Exercise Plan [id number]   Consulted and Agree with Plan of Care Patient           Patient will benefit from skilled therapeutic intervention in order to improve the following deficits and impairments:  Decreased endurance,Hypomobility,Decreased activity tolerance,Decreased strength,Impaired UE functional use,Pain,Decreased mobility,Decreased range of motion,Impaired perceived functional ability,Improper body mechanics,Postural  dysfunction,Impaired flexibility,Decreased coordination  Visit Diagnosis: Chronic left shoulder pain  Muscle weakness (generalized)  Stiffness of left shoulder, not elsewhere classified  Abnormal posture     Problem List Patient Active Problem List   Diagnosis Date Noted  . Adhesive capsulitis of left shoulder 10/27/2020  . Rotator cuff syndrome of left shoulder 10/27/2020  . ED (erectile dysfunction) 03/23/2020  . Embolism, pulmonary with infarction ([place]) 04/10/2015  . Dyslipidemia 08/22/2012  . GERD 11/17/2010  . PVD 08/17/2010  . OTHER SYMPTOMS INVOLVING CARDIOVASCULAR SYSTEM 07/01/2010  . NECK PAIN 06/30/2010  . TOBACCO USE, QUIT 08/14/2009  . ABDOMINAL PAIN 11/14/2008  . COLONIC POLYPS, HX OF 08/14/2008  . BPH associated with nocturia 02/13/2008  . INTERNAL HEMORRHOIDS WITH OTHER COMPLICATION 03/55/7337  . Diverticulosis of colon (without mention of hemorrhage) 01/04/2008  . PROSTATITIS, RECURRENT 01/04/2008    Scot Jun, PT, DPT, OCS, ATC 01/25/21  2:10 PM    [facility] [address] [place], [place], [address] Phone: [phone]   Fax:  [phone]  Name: Connor Neal MRN: [id number] Date of Birth: [birth year]

## 2021-04-21 ENCOUNTER — Telehealth: Payer: Self-pay | Admitting: Family Medicine

## 2021-04-21 NOTE — Chronic Care Management (AMB) (Signed)
  Chronic Care Management   Note  04/21/2021 Name: Connor Neal MRN: [id number] DOB: 08/06/1931  Connor Neal is a 87 y.o. year old male who is a primary care patient of Martinique, Malka So, MD. I reached out to [name] by phone today in response to a referral sent by Connor Neal's PCP, Martinique, Betty G, MD.   Mr. Dominic was given information about Chronic Care Management services today including:  CCM service includes personalized support from designated clinical staff supervised by his physician, including individualized plan of care and coordination with other care providers 24/7 contact phone numbers for assistance for urgent and routine care needs. Service will only be billed when office clinical staff spend 20 minutes or more in a month to coordinate care. Only one practitioner may furnish and bill the service in a calendar month. The patient may stop CCM services at any time (effective at the end of the month) by phone call to the office staff.   Patient agreed to services and verbal consent obtained.   Follow up plan:   [place]ana Secretary/administrator

## 2021-05-21 ENCOUNTER — Ambulatory Visit (INDEPENDENT_AMBULATORY_CARE_PROVIDER_SITE_OTHER)
Admission: RE | Admit: 2021-05-21 | Discharge: 2021-05-21 | Disposition: A | Discharge status: Home / Self Care | Payer: Medicare Other | Source: Ambulatory Visit | Attending: Family Medicine | Admitting: Family Medicine

## 2021-05-21 ENCOUNTER — Ambulatory Visit (INDEPENDENT_AMBULATORY_CARE_PROVIDER_SITE_OTHER): Payer: Medicare Other | Admitting: Family Medicine

## 2021-05-21 ENCOUNTER — Encounter: Payer: Self-pay | Admitting: Family Medicine

## 2021-05-21 ENCOUNTER — Other Ambulatory Visit: Payer: Self-pay

## 2021-05-21 VITALS — BP 118/60 | HR 59 | Resp 16 | Ht 71.0 in | Wt 161.0 lb

## 2021-05-21 DIAGNOSIS — L602 Onychogryphosis: Secondary | ICD-10-CM | POA: Diagnosis not present

## 2021-05-21 DIAGNOSIS — M25571 Pain in right ankle and joints of right foot: Secondary | ICD-10-CM

## 2021-05-21 DIAGNOSIS — R21 Rash and other nonspecific skin eruption: Secondary | ICD-10-CM

## 2021-05-21 DIAGNOSIS — M25572 Pain in left ankle and joints of left foot: Secondary | ICD-10-CM

## 2021-05-21 DIAGNOSIS — M542 Cervicalgia: Secondary | ICD-10-CM | POA: Diagnosis not present

## 2021-05-21 DIAGNOSIS — I2699 Other pulmonary embolism without acute cor pulmonale: Secondary | ICD-10-CM

## 2021-05-21 MED ORDER — TRIAMCINOLONE ACETONIDE 0.1 % EX CREA: 1.0000 "application " | TOPICAL_CREAM | Freq: Two times a day (BID) | CUTANEOUS | 0 refills | Status: AC

## 2021-05-21 NOTE — Progress Notes (Signed)
Chief Complaint  Patient presents with   Back Pain    Pain in spine x a couple months   HPI: [name] is a 87 y.o. male with hx of HLD,OA,and PE on chronic anticoagulation here today complaining of upper back/neck pain as described above. Denies prior hx. Problem has been intermittent,achy like pain, 5/10, not radiated. Exacerbated by certain activities like playing golf. Alleviated by rest.  Mild limitation of ROM. Problem is stable.  Rash on back that is pruritic, started about  a week ago. He denies any new medication, detergent, soap, or body product. No known insect bite or outdoor exposures to plants. No sick contact. No Hx of eczema or similar rash in the past.  OTC medication for this problem: None.  Negative for fever,changes in appetite,abnormal wt loss,night sweats,dysphagia,cough, wheezing, or dyspnea.  Right great toe nail is "coming out" for about a month. No hx of trauma. "Sore" ankles, around. Pain exacerbated by prolonged walking. No edema or erythema. Follows with ortho for right hip pain.  He sees provider every 6 months at the [place]. Listed on his [place] records are : Dementia,back pain,carotid artery obstruction,cervicalgia,diverticulosis,GERD,heart murmur,PVD,neuropathy,and rash. PE on Eliquis 2.5 mg bid, prescribed at the [place].  Review of Systems  Constitutional:  Negative for activity change and fatigue.  HENT:  Negative for mouth sores, nosebleeds and sore throat.   Eyes:  Negative for redness and visual disturbance.  Cardiovascular:  Negative for chest pain, palpitations and leg swelling.  Gastrointestinal:  Negative for abdominal pain, nausea and vomiting.  Genitourinary:  Negative for decreased urine volume and hematuria.  Musculoskeletal:  Positive for arthralgias and back pain. Negative for gait problem.  Neurological:  Negative for syncope, weakness and headaches.  Rest see pertinent positives and negatives per HPI.  Current  Outpatient Medications on File Prior to Visit  Medication Sig Dispense Refill   acetaminophen (TYLENOL) 325 MG tablet Take 650 mg by mouth every 4 (four) hours as needed for mild pain or moderate pain.      apixaban (ELIQUIS) 2.5 MG TABS tablet Take 2.5 mg by mouth 2 (two) times daily.     diclofenac Sodium (VOLTAREN) 1 % GEL Apply 4 g topically 4 (four) times daily. 150 g 1   pravastatin (PRAVACHOL) 20 MG tablet Take 1 tablet (20 mg total) by mouth daily. 90 tablet 3   tamsulosin (FLOMAX) 0.4 MG CAPS capsule TAKE 1 CAPSULE BY MOUTH EVERY DAY 30 capsule 0   vardenafil (LEVITRA) 10 MG tablet Take 1 tablet (10 mg total) by mouth daily as needed for erectile dysfunction. 15 tablet 2   No current facility-administered medications on file prior to visit.   Past Medical History:  Diagnosis Date   Arthritis    BENIGN PROSTATIC HYPERTROPHY 02/13/2008   COLONIC POLYPS 01/04/2008   Diverticulosis of colon (without mention of hemorrhage) 01/04/2008   Frequent headaches    GERD 11/17/2010   Hyperlipidemia    Internal hemorrhoids with other complication 8/56/3149   PROSTATITIS, RECURRENT 01/04/2008   Pulmonary embolism ([place])    PVD 08/17/2010   TOBACCO USE, QUIT 08/14/2009   Allergies  Allergen Reactions   Terazosin    Social History   Socioeconomic History   Marital status: Single    Spouse name: Not on file   Number of children: 3   Years of education: Not on file   Highest education level: Not on file  Occupational History   Not on file  Tobacco Use  Smoking status: Former    Types: Cigarettes    Quit date: 1958    Years since quitting: 64.5   Smokeless tobacco: Never  Substance and Sexual Activity   Alcohol use: No   Drug use: No   Sexual activity: Not on file  Other Topics Concern   Not on file  Social History Narrative   Not on file   Social Determinants of Health   Financial Resource Strain: Not on file  Food Insecurity: Not on file  Transportation Needs: Not on file   Physical Activity: Not on file  Stress: Not on file  Social Connections: Not on file   Vitals:   05/21/21 1413  BP: 118/60  Pulse: (!) 59  Resp: 16  SpO2: 98%   Body mass index is 22.45 kg/m.  Physical Exam Vitals and nursing note reviewed.  Constitutional:      General: He is not in acute distress.    Appearance: He is well-developed.  HENT:     Head: Normocephalic and atraumatic.  Eyes:     Conjunctiva/sclera: Conjunctivae normal.  Cardiovascular:     Rate and Rhythm: Regular rhythm. Bradycardia present. Extrasystoles are present.    Pulses:          Dorsalis pedis pulses are 2+ on the right side and 2+ on the left side.     Heart sounds: Murmur (SEM I/VI LUSB-RUSB) heard.     Comments: Trace LE edema, bilateral. Pulmonary:     Effort: Pulmonary effort is normal. No respiratory distress.     Breath sounds: Normal breath sounds.  Musculoskeletal:     Cervical back: No spasms, tenderness or bony tenderness. No pain with movement. Decreased range of motion (Extension and rotation.).     Thoracic back: No tenderness or bony tenderness.     Right lower leg: Edema present.     Left lower leg: Edema present.     Right ankle: No tenderness.     Left ankle: No tenderness.     Comments: Ankle ROM with no significant abnormalities, bilateral.  Lymphadenopathy:     Cervical: No cervical adenopathy.  Skin:    General: Skin is warm.     Findings: Rash present.          Comments: Right great toenail hypertrophic and left side detached from toenail. No periungual edema or erythema.  Neurological:     Mental Status: He is alert and oriented to person, place, and time.     Comments: Stable gait, not assisted.  Psychiatric:     Comments: Well groomed, good eye contact.   ASSESSMENT AND PLAN:  [name] was seen today for back pain.  Diagnoses and all orders for this visit: Orders Placed This Encounter  Procedures   DG Cervical Spine Complete   Cervical pain  (neck) Reporting this as a new problem but reviewing records this seems to be chronic, most likely OA. Cervical X ray order placed. ROM exercises. Topical icy hot or asper   Bilateral ankle pain, unspecified chronicity We discussed possible etiologies. Most likely OA. Tylenol 500 mg 3-4 times per day prn. Fall precautions discussed.  Rash/skin eruption Topical Kenalog recommended, bid for 14 days at the time. Monitor for new symptoms. If not resolved we may need dermatologist evaluation.  -     triamcinolone cream (KENALOG) 0.1 %; Apply 1 application topically 2 (two) times daily for 14 days.  Hypertrophic toenail May have mild onychomycosis. Recommend keeping toenail trimmed down. Topical Vicks vapor rub  may help.  Embolism, pulmonary with infarction ([place]) On Eliquis 2.5 mg bid. Following with provider at the [facility].  Return if symptoms worsen or fail to improve.   [place] Syler G. Martinique, MD  [facility]. [place] office.

## 2021-05-21 NOTE — Patient Instructions (Signed)
A few things to remember from today's visit:   Cervical pain (neck) - Plan: DG Cervical Spine Complete  Bilateral ankle pain, unspecified chronicity  Rash/skin eruption - Plan: triamcinolone cream (KENALOG) 0.1 %  Hypertrophic toenail  If you need refills please call your pharmacy. Do not use My Chart to request refills or for acute issues that need immediate attention.   Neck and ankle pain seem arthritis. Tylenol 500 mg 3-4 times per day as needed. Over the counter icy hot or asper cream may also help.  Vick vapor rub on toenail may help. The toenail is most likely going to fall.  Steroid cream on area of rash on back, 2 times, small amount,and for 14 days.  Please be sure medication list is accurate. If a new problem present, please set up appointment sooner than planned today.

## 2021-05-26 ENCOUNTER — Telehealth: Payer: Self-pay | Admitting: Family Medicine

## 2021-05-26 NOTE — Telephone Encounter (Signed)
Pt is calling in needing to get his xray results from last week 05/21/2021.

## 2021-05-26 NOTE — Telephone Encounter (Signed)
Informed patient of results and patient verbalized understanding.  

## 2021-05-26 NOTE — Telephone Encounter (Signed)
Cervical X ray was negative for acute process.  It showed mild to moderate degenerative changes at different levels with some narrowing.  So neck pain is most likely caused by arthritis.  If pain gets worse or pain starts radiating to upper extremities, we may need to consider Ortho evaluation. Thanks, BJ

## 2021-05-27 ENCOUNTER — Ambulatory Visit: Payer: Medicare Other | Admitting: Orthopaedic Surgery

## 2021-05-27 ENCOUNTER — Encounter: Payer: Self-pay | Admitting: Surgery

## 2021-05-27 ENCOUNTER — Ambulatory Visit: Payer: Self-pay

## 2021-05-27 ENCOUNTER — Ambulatory Visit (INDEPENDENT_AMBULATORY_CARE_PROVIDER_SITE_OTHER): Payer: Medicare Other | Admitting: Surgery

## 2021-05-27 VITALS — BP 171/73 | HR 49 | Ht 71.0 in | Wt 161.0 lb

## 2021-05-27 DIAGNOSIS — M79674 Pain in right toe(s): Secondary | ICD-10-CM | POA: Diagnosis not present

## 2021-05-27 DIAGNOSIS — M79671 Pain in right foot: Secondary | ICD-10-CM

## 2021-05-27 NOTE — Progress Notes (Signed)
Office Visit Note   Patient: Connor Neal           Date of Birth: 07/11/1931           MRN: [id number] Visit Date: 05/27/2021              Requested by: Martinique, Betty G, MD [address] [place],  [place] [address] PCP: Martinique, Betty G, MD   Assessment & Plan: Visit Diagnoses:  1. Pain in right foot   2. Pain of right great toe     Plan: Advised patient that his pain may related to an acute gout attack.  Offered conservative treatment with first MTP joint intra-articular injection.  Patient sent right great toe was prepped with Betadine and Marcaine/Depo-Medrol 1 to 1/2 injection performed.  Tolerated well without complication.  He did have good relief with anesthetic in place.  Patient follow-up in a few weeks for recheck.  Today blood work was drawn to check a CBC and arthritis panel.  Follow-Up Instructions: No follow-ups on file.   Orders:  Orders Placed This Encounter  Procedures   Small Joint Inj: R great MTP   XR Foot Complete Right   CBC with Differential   Antinuclear Antib (ANA)   Rheumatoid Factor   Uric acid   Sed Rate (ESR)   No orders of the defined types were placed in this encounter.     Procedures: Small Joint Inj: R great MTP on 05/27/2021 3:39 PM Indications: pain Details: 25 G needle, dorsal approach Medications: 1 mL lidocaine 1 %; 0.5 mg methylPREDNISolone acetate 40 MG/ML; 1 mL bupivacaine 0.25 % Consent was given by the patient. Patient was prepped and draped in the usual sterile fashion.      Clinical Data: No additional findings.   Subjective: Chief Complaint  Patient presents with   Right Foot - Pain    HPI 87 year old male comes in to the office with complaints of acute right foot pain and swelling.  States that most the most of his pain is around the right great toe.  He has had some redness in the area as well.  Denies any injury.  Also denies history of gout or previous episodes of this nature.  Pain was sudden  onset.  Aggravated with weightbearing and anything that touches his foot. Review of Systems No current cardiopulmonary GI GU issues  Objective: Vital Signs: BP (!) 171/73 (BP Location: Left Arm)   Pulse (!) 49   Ht '5\' 11"'  (1.803 m)   Wt 161 lb (73 kg)   BMI 22.45 kg/m   Physical Exam HENT:     Head: Normocephalic.     Nose: Nose normal.  Pulmonary:     Effort: Pulmonary effort is normal.  Musculoskeletal:     Comments: Exam he does have some swelling around the right great toe.  Some redness.  No signs infection.  He is exquisitely tender at the first MTP joint.  Limited joint range of motion due to pain and swelling.  Rest of his foot is unremarkable.  Neurological:     General: No focal deficit present.     Mental Status: He is alert and oriented to person, place, and time.    Ortho Exam  Specialty Comments:  No specialty comments available.  Imaging: No results found.   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   Dyslipidemia 08/22/2012   GERD 11/17/2010   PVD 08/17/2010   OTHER SYMPTOMS INVOLVING CARDIOVASCULAR SYSTEM 07/01/2010   NECK PAIN 06/30/2010   TOBACCO USE, QUIT 08/14/2009   ABDOMINAL PAIN 11/14/2008   COLONIC POLYPS, HX OF 08/14/2008   BPH associated with nocturia 02/13/2008   INTERNAL HEMORRHOIDS WITH OTHER COMPLICATION 84/53/6468   Diverticulosis of colon (without mention of hemorrhage) 01/04/2008   PROSTATITIS, RECURRENT 01/04/2008   Past Medical History:  Diagnosis Date   Arthritis    BENIGN PROSTATIC HYPERTROPHY 02/13/2008   COLONIC POLYPS 01/04/2008   Diverticulosis of colon (without mention of hemorrhage) 01/04/2008   Frequent headaches    GERD 11/17/2010   Hyperlipidemia    Internal hemorrhoids with  other complication 0/32/1224   PROSTATITIS, RECURRENT 01/04/2008   Pulmonary embolism ([facility])    PVD 08/17/2010   TOBACCO USE, QUIT 08/14/2009    Family History  Problem Relation Age of Onset   CAD Mother    CAD Sister     Past Surgical History:  Procedure Laterality Date   Right wrist fracture     Social History   Occupational History   Not on file  Tobacco Use   Smoking status: Former    Types: Cigarettes    Quit date: 1958    Years since quitting: 64.8   Smokeless tobacco: Never  Substance and Sexual Activity   Alcohol use: No   Drug use: No   Sexual activity: Not on file

## 2021-05-28 LAB — CBC WITH DIFFERENTIAL/PLATELET
Absolute Monocytes: 655 cells/uL (ref 200–950)
Basophils Absolute: 18 cells/uL (ref 0–200)
Basophils Relative: 0.3 %
Eosinophils Absolute: 100 cells/uL (ref 15–500)
Eosinophils Relative: 1.7 %
HCT: 39.2 % (ref 38.5–50.0)
Hemoglobin: 12.8 g/dL — ABNORMAL LOW (ref 13.2–17.1)
Lymphs Abs: 1451 cells/uL (ref 850–3900)
MCH: 29 pg (ref 27.0–33.0)
MCHC: 32.7 g/dL (ref 32.0–36.0)
MCV: 88.9 fL (ref 80.0–100.0)
MPV: 10.7 fL (ref 7.5–12.5)
Monocytes Relative: 11.1 %
Neutro Abs: 3676 cells/uL (ref 1500–7800)
Neutrophils Relative %: 62.3 %
Platelets: 192 10*3/uL (ref 140–400)
RBC: 4.41 10*6/uL (ref 4.20–5.80)
RDW: 13.6 % (ref 11.0–15.0)
Total Lymphocyte: 24.6 %
WBC: 5.9 10*3/uL (ref 3.8–10.8)

## 2021-05-28 LAB — SEDIMENTATION RATE: Sed Rate: 31 mm/h — ABNORMAL HIGH (ref 0–20)

## 2021-05-28 LAB — RHEUMATOID FACTOR: Rheumatoid fact SerPl-aCnc: <14 IU/mL (ref <14)

## 2021-05-28 LAB — ANA: Anti Nuclear Antibody (ANA): NEGATIVE

## 2021-05-28 LAB — URIC ACID: Uric Acid, Serum: 6.1 mg/dL (ref 4.0–8.0)

## 2021-06-09 ENCOUNTER — Telehealth: Payer: Self-pay | Admitting: Pharmacist

## 2021-06-09 NOTE — Chronic Care Management (AMB) (Addendum)
Chronic Care Management Pharmacy Assistant   Name: Ula Yinger  MRN: [id number] DOB: 11-16-1930  Florencio Peele is an 87 y.o. year old male who presents for his initial CCM visit with the clinical pharmacist.  Reason for Encounter: Chart Prep for initial visit on 06/16/21 with Jeni Salles the clinical pharmacist.   Conditions to be addressed/monitored: GERD, Prostatitis recurrent, BPH, Dyslipidemia.  Primary concerns for visit include: None.   Recent office visits:  05/21/21 Betty Martinique MD (PCP) - seen for cervical pain and other issues. Patient started on Triamcinolone 0.1% topical 2 times daily. Follow up as needed.   Recent consult visits:  05/27/21 Benjiman Core PA-C (Orthopedic Surgery) - pain in right foot. No medication changes. No follow up noted.   03/08/21 Shalini Ramasunder  -  [facility]. Seen for knee injection and serum biopsy of knee. Follow up as needed.  01/25/21 Scot Jun PT - physical therapy for chronic left shoulder pain and other issues. No medication changes. No follow up noted.   12/17/20 [facility] -  patient seen for a pre surgical evaluation for peripheral vascular disease. No medication changes or follow up noted.   Hospital visits:  None in previous 6 months  Medications: Outpatient Encounter Medications as of 06/09/2021  Medication Sig   acetaminophen (TYLENOL) 325 MG tablet Take 650 mg by mouth every 4 (four) hours as needed for mild pain or moderate pain.    apixaban (ELIQUIS) 2.5 MG TABS tablet Take 2.5 mg by mouth 2 (two) times daily.   diclofenac Sodium (VOLTAREN) 1 % GEL Apply 4 g topically 4 (four) times daily.   pravastatin (PRAVACHOL) 20 MG tablet Take 1 tablet (20 mg total) by mouth daily.   tamsulosin (FLOMAX) 0.4 MG CAPS capsule TAKE 1 CAPSULE BY MOUTH EVERY DAY   vardenafil (LEVITRA) 10 MG tablet Take 1 tablet (10 mg total) by mouth daily as needed for erectile  dysfunction.   No facility-administered encounter medications on file as of 06/09/2021.   Fill History: DICLOFENAC SODIUM 1% GEL 03/23/2020 20   TAMSULOSIN HCL 0.4 MG CAPSULE 04/24/2019 30   TRIAMCINOLONE 0.1% CREAM 05/21/2021 14   VARDENAFIL HCL 10 MG TABLET 04/28/2020 30   Initial Questions: Have you seen any other providers since your last visit? Yes. Patient saw Dr. Lorin Mercy a couple weeks ago about his right foot.  Any changes in your medications or health? No changes in medications lately and his health is the same currently.   Any side effects from any medications? Patient has had no issues from his medications that he knows of.  Do you have an symptoms or problems not managed by your medications? Patient feels like his medications are doing their job currently.  Any concerns about your health right now? Patient does not have any concerns at this time.  Has your provider asked that you check blood pressure, blood sugar, or follow special diet at home? No. Patient has not been asked to do any of the above.   Do you get any type of exercise on a regular basis? Patient states he walks the track near his house and cleans his house daily. Patient goes grocery shopping still on her own.  Can you think of a goal you would like to reach for your health? Patient would like to live many more years! Patient would like to maintain his current health and stay active doing yardwork and going to church.  Do you have any problems getting your medications? Patient has had no issues getting any of his medications.  Is there anything that you would like to discuss during the appointment? Patient cannot think of anything currently.   Please bring medications and supplements to appointment.  Notes: Spoke with Marjory Lies at [facility] and patient never picked up his atorvastatin from last year. Christy at [facility] stated they have never filled medication for patient. I left a detailed voicemail for patient to  call back and inform me if he has gotten it from somewhere else or if he has been taking. Madeline pryor aware.  Care Gaps:   AWV - scheduled for 08/06/21 Covid-19 vaccine booster 3 - overdue since 06/07/20 Influenza vaccine - overdue since 06/07/21  Star Rating Drugs:  Pravastatin '20mg'$  - patient gets medication from the [facility] Pharmacist Assistant [phone]

## 2021-06-11 ENCOUNTER — Ambulatory Visit (INDEPENDENT_AMBULATORY_CARE_PROVIDER_SITE_OTHER): Payer: Medicare Other | Admitting: Orthopaedic Surgery

## 2021-06-11 ENCOUNTER — Encounter: Payer: Self-pay | Admitting: Orthopaedic Surgery

## 2021-06-11 ENCOUNTER — Other Ambulatory Visit: Payer: Self-pay

## 2021-06-11 DIAGNOSIS — M79676 Pain in unspecified toe(s): Secondary | ICD-10-CM | POA: Insufficient documentation

## 2021-06-11 DIAGNOSIS — M79674 Pain in right toe(s): Secondary | ICD-10-CM

## 2021-06-11 NOTE — Progress Notes (Signed)
Office Visit Note   Patient: Connor Neal           Date of Birth: 09/20/1931           MRN: [id number] Visit Date: 06/11/2021              Requested by: Martinique, Betty G, MD [address] [place],  [place] [address] PCP: Martinique, Betty G, MD   Assessment & Plan: Visit Diagnoses:  1. Pain of toe of right foot     Plan: Patient may have had a little bit of gout in his great toe.  Last creatinine was normal at 1 point last year he had mild elevation in his creatinine.  He has been told to avoid taking anti-inflammatories.  We discussed if he had recurrence he could take some Aleve 2 pills daily for few days and then stop.  If he has recurrent symptoms he can return and we can recheck his uric acid.  Currently with this only single episode recently I would not recommend any further treatment.  If he gets recurrent symptoms he will return.  Follow-Up Instructions: No follow-ups on file.   Orders:  No orders of the defined types were placed in this encounter.  No orders of the defined types were placed in this encounter.     Procedures: No procedures performed   Clinical Data: No additional findings.   Subjective: Chief Complaint  Patient presents with   Right Foot - Follow-up    HPI 87 year old male returns for right foot pain primarily first MTP joint.  He states swelling redness and soreness is gone down.  Rheumatologic labs showed mild elevation sed rate at 31 and uric acid was 6.1 with normal upper limits being 7.  He states he is occasionally had something similar to this sometimes been in the other foot.  Patient is back using regular shoes and states she is able to ambulate without pain or limping.  Review of Systems all the systems noncontributory to HPI.   Objective: Vital Signs: BP 125/65   Pulse (!) 55   Ht '5\' 11"'$  (1.803 m)   Wt 152 lb (68.9 kg)   BMI 21.20 kg/m   Physical Exam Constitutional:      Appearance: He is well-developed.   HENT:     Head: Normocephalic and atraumatic.     Right Ear: External ear normal.     Left Ear: External ear normal.  Eyes:     Pupils: Pupils are equal, round, and reactive to light.  Neck:     Thyroid: No thyromegaly.     Trachea: No tracheal deviation.  Cardiovascular:     Rate and Rhythm: Normal rate.  Pulmonary:     Effort: Pulmonary effort is normal.     Breath sounds: No wheezing.  Abdominal:     General: Bowel sounds are normal.     Palpations: Abdomen is soft.  Musculoskeletal:     Cervical back: Neck supple.  Skin:    General: Skin is warm and dry.     Capillary Refill: Capillary refill takes less than 2 seconds.  Neurological:     Mental Status: He is alert and oriented to person, place, and time.  Psychiatric:        Behavior: Behavior normal.        Thought Content: Thought content normal.        Judgment: Judgment normal.    Ortho Exam no swelling or erythema first MTP joint.  Great toenail thickening with some fungus.  No paronychial inflammation.  Ankle range of motion is normal tenosynovium peroneals posterior tib is normal.  Specialty Comments:  No specialty comments available.  Imaging: No results found.   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/10/2015   Dyslipidemia 08/22/2012   GERD 11/17/2010   PVD 08/17/2010   OTHER SYMPTOMS INVOLVING CARDIOVASCULAR SYSTEM 07/01/2010   NECK PAIN 06/30/2010   TOBACCO USE, QUIT 08/14/2009   ABDOMINAL PAIN 11/14/2008   COLONIC POLYPS, HX OF 08/14/2008   BPH associated with nocturia 02/13/2008   INTERNAL HEMORRHOIDS WITH OTHER COMPLICATION 123XX123   Diverticulosis of colon (without mention of hemorrhage) 01/04/2008   PROSTATITIS, RECURRENT 01/04/2008   Past Medical  History:  Diagnosis Date   Arthritis    BENIGN PROSTATIC HYPERTROPHY 02/13/2008   COLONIC POLYPS 01/04/2008   Diverticulosis of colon (without mention of hemorrhage) 01/04/2008   Frequent headaches    GERD 11/17/2010   Hyperlipidemia    Internal hemorrhoids with other complication [id number]   PROSTATITIS, RECURRENT 01/04/2008   Pulmonary embolism ([facility])    PVD 08/17/2010   TOBACCO USE, QUIT 08/14/2009    Family History  Problem Relation Age of Onset   CAD Mother    CAD Sister     Past Surgical History:  Procedure Laterality Date   Right wrist fracture     Social History   Occupational History   Not on file  Tobacco Use   Smoking status: Former    Types: Cigarettes    Quit date: 1958    Years since quitting: 64.6   Smokeless tobacco: Never  Substance and Sexual Activity   Alcohol use: No   Drug use: No   Sexual activity: Not on file

## 2021-06-15 ENCOUNTER — Telehealth: Payer: Self-pay | Admitting: Pharmacist

## 2021-06-15 NOTE — Chronic Care Management (AMB) (Signed)
    Chronic Care Management Pharmacy Assistant   Name: Renn Bucholz  MRN: [id number] DOB: Jan 14, 1931  06/15/21-  Called patient to remind of appointment with Jeni Salles) on (06/16/21 at 2pm via telephone.)  Patient aware of appointment date, time, and type of appointment (either telephone or in person). Patient aware to have/bring all medications, supplements, blood pressure and/or blood sugar logs to visit.  Questions: Have you had any recent office visit or specialist visit outside of [facility]? No.  Are there any concerns you would like to discuss during your office visit? No concerns at this time.  Are you having any problems obtaining your medications? (Whether it pharmacy issues or cost) no issues at this time.  If patient has any PAP medications ask if they are having any problems getting their PAP medication or refill? No patient assistance at this time.   Care Gaps:  AWV - scheduled fro 08/06/21 Covid-19 vaccine booster 3 - overdue since 06/07/20 Influenza vaccine -  due  Star Rating Drug:  Pravastatin '20mg'$  - patient gets from the [facility].  Any gaps in medications fill history? No.  [place]  Clinical Pharmacist Assistant [phone]

## 2021-06-16 ENCOUNTER — Ambulatory Visit (INDEPENDENT_AMBULATORY_CARE_PROVIDER_SITE_OTHER): Payer: Medicare Other | Admitting: Pharmacist

## 2021-06-16 DIAGNOSIS — N401 Enlarged prostate with lower urinary tract symptoms: Secondary | ICD-10-CM

## 2021-06-16 DIAGNOSIS — E785 Hyperlipidemia, unspecified: Secondary | ICD-10-CM | POA: Diagnosis not present

## 2021-06-16 DIAGNOSIS — R351 Nocturia: Secondary | ICD-10-CM

## 2021-06-16 DIAGNOSIS — I2699 Other pulmonary embolism without acute cor pulmonale: Secondary | ICD-10-CM

## 2021-06-16 NOTE — Progress Notes (Signed)
Chronic Care Management Pharmacy Note  07/02/2021 Name:  Connor Neal MRN:  [id number] DOB:  1931/05/14  Summary: BP is not at goal < 140/90 but patient is only checking sporadically LDL not at goal < 70  Recommendations/Changes made from today's visit: -Recommended repeat lipid panel and consider increasing atorvastatin to 20 mg daily -Recommended weekly BP monitoring  Plan: BP assessment in 1-2 months   Subjective: Connor Neal is an 87 y.o. year old male who is a primary patient of Martinique, Malka So, MD.  The CCM team was consulted for assistance with disease management and care coordination needs.    Engaged with patient by telephone for initial visit in response to provider referral for pharmacy case management and/or care coordination services.   Consent to Services:  The patient was given the following information about Chronic Care Management services today, agreed to services, and gave verbal consent: 1. CCM service includes personalized support from designated clinical staff supervised by the primary care provider, including individualized plan of care and coordination with other care providers 2. 24/7 contact phone numbers for assistance for urgent and routine care needs. 3. Service will only be billed when office clinical staff spend 20 minutes or more in a month to coordinate care. 4. Only one practitioner may furnish and bill the service in a calendar month. 5.The patient may stop CCM services at any time (effective at the end of the month) by phone call to the office staff. 6. The patient will be responsible for cost sharing (co-pay) of up to 20% of the service fee (after annual deductible is met). Patient agreed to services and consent obtained.  Patient Care Team: Martinique, Betty G, MD as PCP - General (Family Medicine) Viona Gilmore, [facility] as Pharmacist (Pharmacist)  Recent office visits: 05/21/21 Betty Martinique MD (PCP) - seen for cervical pain and  other issues. Patient started on Triamcinolone 0.1% topical 2 times daily. Follow up as needed.   Recent consult visits: 06/11/21 Cheryll Cockayne, MD (orthopedic surgery): Patient presented for pain in toe on right foot.  05/27/21 Benjiman Core PA-C (Orthopedic Surgery) - pain in right foot. No medication changes. No follow up noted.   03/08/21 Shalini Ramasunder  -  [facility]. Seen for knee injection and serum biopsy of knee. Follow up as needed.   01/25/21 Scot Jun PT - physical therapy for chronic left shoulder pain and other issues. No medication changes. No follow up noted.   12/17/20 [place] -  patient seen for a pre surgical evaluation for peripheral vascular disease. No medication changes or follow up noted.  Hospital visits: 02/04/21 Patient admitted for day surgery. Unable to access notes.    Objective:  Lab Results  Component Value Date   CREATININE 1.19 03/23/2020   BUN 25 (H) 03/23/2020   GFR 69.97 03/23/2020   GFRNONAA 47 (L) 12/11/2019   GFRAA 55 (L) 12/11/2019   NA 139 03/23/2020   K 4.5 03/23/2020   CALCIUM 9.1 03/23/2020   CO2 28 03/23/2020   GLUCOSE 87 03/23/2020    Lab Results  Component Value Date/Time   GFR 69.97 03/23/2020 12:57 PM   GFR 69.35 12/28/2017 01:10 PM    Last diabetic Eye exam: No results found for: HMDIABEYEEXA  Last diabetic Foot exam: No results found for: HMDIABFOOTEX   Lab Results  Component Value Date   CHOL 159 03/23/2020   HDL 72.20 03/23/2020   LDLCALC 74  03/23/2020   LDLDIRECT 111.0 08/14/2009   TRIG 63.0 03/23/2020   CHOLHDL 2 03/23/2020    Hepatic Function Latest Ref Rng & Units 03/23/2020 12/28/2017 08/25/2015  Total Protein 6.0 - 8.3 g/dL 6.9 6.5 6.7  Albumin 3.5 - 5.2 g/dL 3.9 3.4(L) 3.7  AST 0 - 37 U/L _0 ALT 0 - 53 U/L _1 Alk Phosphatase 39 - 117 U/L 96 86 69  Total Bilirubin 0.2 - 1.2 mg/dL 0.6 0.5 0.6  Bilirubin, Direct 0.0 - 0.3 mg/dL - - -     Lab Results  Component Value Date/Time   TSH 1.23 12/28/2017 01:10 PM   TSH 0.80 08/25/2015 11:37 AM    CBC Latest Ref Rng & Units 05/27/2021 12/11/2019 12/28/2017  WBC 3.8 - 10.8 Thousand/uL 5.9 4.4 4.9  Hemoglobin 13.2 - 17.1 g/dL 12.8(L) 13.2 12.7(L)  Hematocrit 38.5 - 50.0 % 39.2 41.6 38.5(L)  Platelets 140 - 400 Thousand/uL 192 162 191.0    No results found for: VD25OH  Clinical ASCVD: Yes  The ASCVD Risk score [name], et al., 2013) failed to calculate for the following reasons:   The 2013 ASCVD risk score is only valid for ages 67 to 81    Depression screen PHQ 2/9 08/05/2020 03/23/2020 12/05/2017  Decreased Interest 0 0 0  Down, Depressed, Hopeless 0 0 0  PHQ - 2 Score 0 0 0      Social History   Tobacco Use  Smoking Status Former   Types: Cigarettes   Quit date: 1958   Years since quitting: 64.6  Smokeless Tobacco Never   BP Readings from Last 3 Encounters:  06/11/21 125/65  05/27/21 (!) 171/73  05/21/21 118/60   Pulse Readings from Last 3 Encounters:  06/11/21 (!) 55  05/27/21 (!) 49  05/21/21 (!) 59   Wt Readings from Last 3 Encounters:  06/11/21 152 lb (68.9 kg)  05/27/21 161 lb (73 kg)  05/21/21 161 lb (73 kg)   BMI Readings from Last 3 Encounters:  06/11/21 21.20 kg/m  05/27/21 22.45 kg/m  05/21/21 22.45 kg/m    Assessment/Interventions: Review of patient past medical history, allergies, medications, health status, including review of consultants reports, laboratory and other test data, was performed as part of comprehensive evaluation and provision of chronic care management services.   SDOH:  (Social Determinants of Health) assessments and interventions performed: Yes SDOH Interventions    Flowsheet Row Most Recent Value  SDOH Interventions   Financial Strain Interventions Intervention Not Indicated  Transportation Interventions Intervention Not Indicated      SDOH Screenings   Alcohol Screen: Not on file  Depression  (PHQ2-9): Low Risk    PHQ-2 Score: 0  Financial Resource Strain: Low Risk    Difficulty of Paying Living Expenses: Not hard at all  Food Insecurity: Not on file  Housing: Not on file  Physical Activity: Not on file  Social Connections: Not on file  Stress: Not on file  Tobacco Use: Medium Risk   Smoking Tobacco Use: Former   Smokeless Tobacco Use: Never  Transportation Needs: No Transportation Needs   Lack of Transportation (Medical): No   Lack of Transportation (Non-Medical): No   Patient does some work around [employer] and goes to church and sometimes hits a few golf balls. There is a place across the street that he can go to and hasn't done true golf in a little. Patient does go for a walk every once in a while  and goes late in the afternoon. Patient walks in his neighborhood.  Patient has family in [place] and [place] and lives right in the middle of [place]. He gets to see family almost every week. Patient worships every Sunday and Wednesday night. Patient is a Environmental education officer.   Patient is doing all of the cooking and cleaning. Sometimes his daughter comes up to fix some meals for him. Patient does eat out at [employer] sometimes and eats out 1-2 times a week. Patient eats a lot of vegetables (greens, string beans, beets, squash) and mostly eats chicken and fish. Patient eats very little dessert and sometimes eats little debbie's cakes or peaches or applesauce. Patient drinks milk, regular coke in small cans sometimes and a lot of water.  Patient is not sleeping too good at night because his mattress is hard. Patient is thinking about getting a new mattress. Patient hardly ever takes naps and has been sleeping in his recliner for the last several months.  Patient said he has some side effects with medications. Patient is getting all medications from the [place]. Patient still has the pill box but doesn't use it. Recommended getting pillbox with AM/PM.  CCM Care Plan  Allergies   Allergen Reactions   Terazosin     Medications Reviewed Today     Reviewed by Viona Gilmore, [facility] (Pharmacist) on 06/16/21 at 1443  Med List Status: <None>   Medication Order Taking? Sig Documenting Provider Last Dose Status Informant  acetaminophen (TYLENOL) 325 MG tablet [id number] Yes Take 650 mg by mouth every 4 (four) hours as needed for mild pain or moderate pain.  [provider] Taking Active Multiple Informants  apixaban (ELIQUIS) 5 MG TABS tablet [id number] Yes Take 2.5 mg by mouth 2 (two) times daily. [provider] Taking Active Multiple Informants  atorvastatin (LIPITOR) 20 MG tablet [id number] Yes Take 10 mg by mouth daily. [provider] Taking Active   losartan (COZAAR) 25 MG tablet [id number] Yes Take 25 mg by mouth daily. 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   Dyslipidemia 08/22/2012   GERD 11/17/2010   PVD 08/17/2010   OTHER SYMPTOMS INVOLVING CARDIOVASCULAR SYSTEM 07/01/2010   NECK PAIN 06/30/2010   TOBACCO USE, QUIT 08/14/2009   ABDOMINAL PAIN 11/14/2008   COLONIC POLYPS, HX OF 08/14/2008   BPH associated with nocturia 02/13/2008   INTERNAL HEMORRHOIDS WITH OTHER COMPLICATION 63/84/5364   Diverticulosis of colon (without mention of hemorrhage) 01/04/2008   PROSTATITIS, RECURRENT 01/04/2008    Immunization History  Administered Date(s) Administered   Fluad Quad(high Dose 65+) 08/05/2020   Influenza Whole 08/14/2008, 08/17/2010   Influenza, High Dose Seasonal PF 08/06/2015   PFIZER(Purple Top)SARS-COV-2 Vaccination 12/14/2019, 01/06/2020   Tdap 11/12/2015   Zoster Recombinat (Shingrix) 07/07/2020, 01/01/2021    Conditions to be  addressed/monitored:  Hyperlipidemia, GERD, BPH, and ED, history of PE  Care Plan : [place]  Updates made by Viona Gilmore, [place] since 07/02/2021 12:00 AM     Problem: Problem: Hypertension, Hyperlipidemia, GERD, BPH, and ED, history of PE      Long-Range Goal: Patient-Specific Goal   Start Date: 06/16/2021  Expected End Date: 06/16/2022  This Visit's Progress: On track  Priority: High  Note:   Current Barriers:  Unable to independently monitor therapeutic efficacy Unable to maintain control of blood pressure  Pharmacist Clinical Goal(s):  Patient will achieve adherence to monitoring guidelines and medication adherence to achieve therapeutic efficacy maintain control of blood pressure as evidenced by home BP readings  through collaboration with PharmD and provider.   Interventions: 1:1 collaboration with Martinique, Betty G, MD regarding development and update of comprehensive plan of care as evidenced by provider attestation and co-signature Inter-disciplinary care team collaboration (see longitudinal plan of care) Comprehensive medication review performed; medication list updated in electronic medical record  Hypertension (BP goal <140/90) -Not ideally controlled -Current treatment: Losartan 25 mg 1 tablet daily -Medications previously tried: none -Current home readings: 148/80 -Current dietary habits: patient uses very little salt and does look at package labels -Current exercise habits: active during the day -Denies hypotensive/hypertensive symptoms -Educated on BP goals and benefits of medications for prevention of heart attack, stroke and kidney damage; Importance of home blood pressure monitoring; Proper BP monitoring technique; -Counseled to monitor BP at home weekly, document, and provide log at future appointments -Counseled on diet and exercise extensively Recommended to continue current medication  Hyperlipidemia/PVD: (LDL goal < 70) -Not ideally  controlled -Current treatment: Atorvastatin 20 mg 1/2 tablet daily -Medications previously tried: pravastatin -Current dietary patterns: refer to above -Current exercise habits: active some during the day -Educated on Cholesterol goals;  Benefits of statin for ASCVD risk reduction; Importance of limiting foods high in cholesterol; -Counseled on diet and exercise extensively Recommended to continue current medication Recommended repeat lipid panel and consider increasing atorvastatin to 20 mg daily.  BPH (Goal: minimize symptoms of enlarged prostate) -Controlled -Current treatment  None -Medications previously tried: terazosin (unknown), tamsulosin (just stopped) -Recommended to continue current medication  History of PE (Goal: prevent blood clots) -Controlled -Current treatment  Eliquis 5 mg 1/2 tablet twice daily -Medications previously tried: none  -Recommended asking for 2.5 mg tablet.  Pain (Goal: minimize pain) -Controlled -Current treatment  Acetaminophen 650 mg 1 tablet every 4 hours as needed -Medications previously tried: none  -Recommended to continue current medication Counseled on avoiding use of more than 3000 mg of Tylenol per day   Health Maintenance -Vaccine gaps: COVID booster (declines) -Current therapy:  Vitamin B12 1000 mcg daily -Educated on Cost vs benefit of each product must be carefully weighed by individual consumer -Patient is satisfied with current therapy and denies issues -Recommended to continue current medication  Patient Goals/Self-Care Activities Patient will:  - focus on medication adherence by using a pillbox check blood pressure weekly, document, and provide at future appointments  Follow Up Plan: Telephone follow up appointment with care management team member scheduled for: 6 months       Medication Assistance: None required.  Patient affirms current coverage meets needs.  Compliance/Adherence/Medication fill  history: Care Gaps: COVID booster, influenza  Star-Rating Drugs: Pravastatin 2m - [facility] Patient's [facility]:  [id number] [place], [place]: [phone]: [phone]: [phone]: [phone] pill box? No - has one but doesn't use Pt endorses 95% compliance  We discussed: Current pharmacy is preferred with insurance plan and patient is satisfied with pharmacy services Patient decided to: Continue current medication management  strategy  Care Plan and Follow Up Patient Decision:  Patient agrees to Care Plan and Follow-up.  Plan: Telephone follow up appointment with care management team member scheduled for:  6 months  Jeni Salles, PharmD, [place] Pharmacist [place] [facility] [place] [phone]

## 2021-07-02 NOTE — Patient Instructions (Addendum)
Hi Kodee,  It was great to get to meet you over the telephone! Below is a summary of some of the topics we discussed.   Please reach out to me if you have any questions or need anything before our follow up!  Best, Maddie  Jeni Salles, PharmD, [place] [facility] [place]   Visit Information   Goals Addressed             This Visit's Progress    Manage My Medicine       Timeframe:  Short-Term Goal Priority:  Medium Start Date:                             Expected End Date:                       Follow Up Date 01/01/21    - keep a list of all the medicines I take; vitamins and herbals too - use a pillbox to sort medicine    Why is this important?   These steps will help you keep on track with your medicines.   Notes:        Patient Care Plan: CCM Pharmacy Care Plan     Problem Identified: Problem: Hypertension, Hyperlipidemia, GERD, BPH, and ED, history of PE      Long-Range Goal: Patient-Specific Goal   Start Date: 06/16/2021  Expected End Date: 06/16/2022  This Visit's Progress: On track  Priority: High  Note:   Current Barriers:  Unable to independently monitor therapeutic efficacy Unable to maintain control of blood pressure  Pharmacist Clinical Goal(s):  Patient will achieve adherence to monitoring guidelines and medication adherence to achieve therapeutic efficacy maintain control of blood pressure as evidenced by home BP readings  through collaboration with PharmD and provider.   Interventions: 1:1 collaboration with Martinique, Betty G, MD regarding development and update of comprehensive plan of care as evidenced by provider attestation and co-signature Inter-disciplinary care team collaboration (see longitudinal plan of care) Comprehensive medication review performed; medication list updated in electronic medical record  Hypertension (BP goal <140/90) -Not ideally controlled -Current  treatment: Losartan 25 mg 1 tablet daily -Medications previously tried: none -Current home readings: 148/80 -Current dietary habits: patient uses very little salt and does look at package labels -Current exercise habits: active during the day -Denies hypotensive/hypertensive symptoms -Educated on BP goals and benefits of medications for prevention of heart attack, stroke and kidney damage; Importance of home blood pressure monitoring; Proper BP monitoring technique; -Counseled to monitor BP at home weekly, document, and provide log at future appointments -Counseled on diet and exercise extensively Recommended to continue current medication  Hyperlipidemia/PVD: (LDL goal < 70) -Not ideally controlled -Current treatment: Atorvastatin 20 mg 1/2 tablet daily -Medications previously tried: pravastatin -Current dietary patterns: refer to above -Current exercise habits: active some during the day -Educated on Cholesterol goals;  Benefits of statin for ASCVD risk reduction; Importance of limiting foods high in cholesterol; -Counseled on diet and exercise extensively Recommended to continue current medication Recommended repeat lipid panel and consider increasing atorvastatin to 20 mg daily.  BPH (Goal: minimize symptoms of enlarged prostate) -Controlled -Current treatment  None -Medications previously tried: terazosin (unknown), tamsulosin (just stopped) -Recommended to continue current medication  History of PE (Goal: prevent blood clots) -Controlled -Current treatment  Eliquis 5 mg 1/2 tablet twice daily -Medications previously tried: none  -Recommended asking for 2.5  mg tablet.  Pain (Goal: minimize pain) -Controlled -Current treatment  Acetaminophen 650 mg 1 tablet every 4 hours as needed -Medications previously tried: none  -Recommended to continue current medication Counseled on avoiding use of more than 3000 mg of Tylenol per day   Health Maintenance -Vaccine gaps:  COVID booster (declines) -Current therapy:  Vitamin B12 1000 mcg daily -Educated on Cost vs benefit of each product must be carefully weighed by individual consumer -Patient is satisfied with current therapy and denies issues -Recommended to continue current medication  Patient Goals/Self-Care Activities Patient will:  - focus on medication adherence by using a pillbox check blood pressure weekly, document, and provide at future appointments  Follow Up Plan: Telephone follow up appointment with care management team member scheduled for: 6 months      Mr. Shelton was given information about Chronic Care Management services today including:  CCM service includes personalized support from designated clinical staff supervised by his physician, including individualized plan of care and coordination with other care providers 24/7 contact phone numbers for assistance for urgent and routine care needs. Standard insurance, coinsurance, copays and deductibles apply for chronic care management only during months in which we provide at least 20 minutes of these services. Most insurances cover these services at 100%, however patients may be responsible for any copay, coinsurance and/or deductible if applicable. This service may help you avoid the need for more expensive face-to-face services. Only one practitioner may furnish and bill the service in a calendar month. The patient may stop CCM services at any time (effective at the end of the month) by phone call to the office staff.  Patient agreed to services and verbal consent obtained.   The patient verbalized understanding of instructions, educational materials, and care plan provided today and agreed to receive a mailed copy of patient instructions, educational materials, and care plan.  Telephone follow up appointment with pharmacy team member scheduled for: 6 months  Viona Gilmore, [facility]

## 2021-08-06 ENCOUNTER — Ambulatory Visit: Payer: Medicare Other | Admitting: Family Medicine

## 2021-08-09 ENCOUNTER — Telehealth: Payer: Self-pay | Admitting: Pharmacist

## 2021-08-09 NOTE — Chronic Care Management (AMB) (Signed)
    Chronic Care Management Pharmacy Assistant   Name: Jes Costales  MRN: [id number] DOB: 13-Dec-1930  Reason for Encounter: Disease State/ Hypertension Assessment Call.   Conditions to be addressed/monitored: HTN   Recent office visits: None.  Recent consult visits:  07/09/21 [facility] and [place]) - seen for dementia, abdominal pain and other chronic issues. No medication changes or follow up noted.   Hospital visits:  None in previous 6 months  Medications: Outpatient Encounter Medications as of 08/09/2021  Medication Sig   acetaminophen (TYLENOL) 325 MG tablet Take 650 mg by mouth every 4 (four) hours as needed for mild pain or moderate pain.    apixaban (ELIQUIS) 5 MG TABS tablet Take 2.5 mg by mouth 2 (two) times daily.   atorvastatin (LIPITOR) 20 MG tablet Take 10 mg by mouth daily.   losartan (COZAAR) 25 MG tablet Take 25 mg by mouth daily.   No facility-administered encounter medications on file as of 08/09/2021.   Fill History: TRIAMCINOLONE 0.1% CREAM 05/21/2021 14   Reviewed chart prior to disease state call. Spoke with patient regarding BP  Recent Office Vitals: BP Readings from Last 3 Encounters:  06/11/21 125/65  05/27/21 (!) 171/73  05/21/21 118/60   Pulse Readings from Last 3 Encounters:  06/11/21 (!) 55  05/27/21 (!) 49  05/21/21 (!) 59    Wt Readings from Last 3 Encounters:  06/11/21 152 lb (68.9 kg)  05/27/21 161 lb (73 kg)  05/21/21 161 lb (73 kg)     Kidney Function Lab Results  Component Value Date/Time   CREATININE 1.19 03/23/2020 12:57 PM   CREATININE 1.35 (H) 12/11/2019 12:52 PM   GFR 69.97 03/23/2020 12:57 PM   GFRNONAA 47 (L) 12/11/2019 12:52 PM   GFRAA 55 (L) 12/11/2019 12:52 PM    BMP Latest Ref Rng & Units 03/23/2020 12/11/2019 12/28/2017  Glucose 70 - 99 mg/dL 87 196(H) 91  BUN 6 - 23 mg/dL 25(H) 18 21  Creatinine 0.40 - 1.50 mg/dL 1.19 1.35(H) 1.27  Sodium 135 - 145 mEq/L  139 142 141  Potassium 3.5 - 5.1 mEq/L 4.5 4.4 4.7  Chloride 96 - 112 mEq/L 103 106 105  CO2 19 - 32 mEq/L 28 27 32  Calcium 8.4 - 10.5 mg/dL 9.1 9.6 9.3    Current antihypertensive regimen:  Losartan 25mg  - take 1 tablet by mouth daily.  How often are you checking your Blood Pressure? daily Current home BP readings: 180/90 on 08/08/21. What recent interventions/DTPs have been made by any provider to improve Blood Pressure control since last CPP Visit: None. Any recent hospitalizations or ED visits since last visit with CPP? No  Adherence Review: Is the patient currently on ACE/ARB medication? Yes Does the patient have >5 day gap between last estimated fill dates? No  Patient refused to speak with me or answer any questions.  Care Gaps:  AWV - scheduled for 08/27/21 Covid-19 vaccine booster 3 - overdue since 06/07/20 Flu vaccine - due  Star Rating Drugs:  Atorvastatin 20mg  - [facility] Losartan 25mg  - [facility]  Clinical Pharmacist Assistant [phone]

## 2021-08-13 ENCOUNTER — Encounter: Payer: Self-pay | Admitting: Surgery

## 2021-08-13 MED ORDER — BUPIVACAINE HCL 0.25 % IJ SOLN
1.0000 mL | INTRAMUSCULAR | Status: AC | PRN
  Administered 2021-05-27: 1 mL via INTRA_ARTICULAR

## 2021-08-13 MED ORDER — METHYLPREDNISOLONE ACETATE 40 MG/ML IJ SUSP
0.5000 mg | INTRAMUSCULAR | Status: AC | PRN
  Administered 2021-05-27: .5 mg via INTRA_ARTICULAR

## 2021-08-13 MED ORDER — LIDOCAINE HCL 1 % IJ SOLN
1.0000 mL | INTRAMUSCULAR | Status: AC | PRN
  Administered 2021-05-27: 1 mL

## 2021-08-27 ENCOUNTER — Encounter: Payer: Self-pay | Admitting: Family Medicine

## 2021-08-27 ENCOUNTER — Other Ambulatory Visit: Payer: Self-pay

## 2021-08-27 ENCOUNTER — Ambulatory Visit (INDEPENDENT_AMBULATORY_CARE_PROVIDER_SITE_OTHER): Payer: Medicare Other | Admitting: Family Medicine

## 2021-08-27 VITALS — BP 130/80 | HR 62 | Resp 16 | Ht 71.0 in | Wt 161.4 lb

## 2021-08-27 DIAGNOSIS — E785 Hyperlipidemia, unspecified: Secondary | ICD-10-CM | POA: Diagnosis not present

## 2021-08-27 DIAGNOSIS — I739 Peripheral vascular disease, unspecified: Secondary | ICD-10-CM

## 2021-08-27 DIAGNOSIS — I2699 Other pulmonary embolism without acute cor pulmonale: Secondary | ICD-10-CM

## 2021-08-27 DIAGNOSIS — I10 Essential (primary) hypertension: Secondary | ICD-10-CM | POA: Diagnosis not present

## 2021-08-27 DIAGNOSIS — Z0000 Encounter for general adult medical examination without abnormal findings: Secondary | ICD-10-CM | POA: Diagnosis not present

## 2021-08-27 NOTE — Patient Instructions (Addendum)
[name] , Thank you for taking time to come for your Medicare Wellness Visit. I appreciate your ongoing commitment to your health goals. Please review the following plan we discussed and let me know if I can assist you in the future.   These are the goals we discussed:  Goals      Manage My Medicine     Try to walk 10-15 min every other day. Fall precautions. Continue monitoring blood pressure.     Prevent falls              This is a list of the screening recommended for you and due dates:  Health Maintenance  Topic Date Due   COVID-19 Vaccine (3 - Booster for Pfizer series) 09/12/2021*   Tetanus Vaccine  11/11/2025   Pneumonia Vaccine  Completed   Flu Shot  Completed   Zoster (Shingles) Vaccine  Completed   HPV Vaccine  Aged Out  *Topic was postponed. The date shown is not the original due date.   A few tips:  -As we age balance is not as good as it was, so there is a higher risks for falls. Please remove small rugs and furniture that is "in your way" and could increase the risk of falls. Stretching exercises may help with fall prevention: Yoga and Tai Chi are some examples. Low impact exercise is better, so you are not very achy the next day.  -Sun screen and avoidance of direct sun light recommended. Caution with dehydration, if working outdoors be sure to drink enough fluids.  - Some medications are not safe as we age, increases the risk of side effects and can potentially interact with other medication you are also taken;  including some of over the counter medications. Be sure to let me know when you start a new medication even if it is a dietary/vitamin supplement.  -Healthy diet low in red meet/animal fat and sugar + regular physical activity is recommended.    A few things to remember from today's visit:   Routine general medical examination at a health care facility  Dyslipidemia  Essential (primary) hypertension  Medicare annual wellness visit,  subsequent  PVD  If you need refills please call your pharmacy. Do not use My Chart to request refills or for acute issues that need immediate attention.   Keep follow up appt at the [facility], I will see you back in a year.  Please be sure medication list is accurate. If a new problem present, please set up appointment sooner than planned today.        Fat and Cholesterol Restricted Eating Plan Eating a diet that limits fat and cholesterol may help lower your risk for heart disease and other conditions. Your body needs fat and cholesterol for basic functions, but eating too much of these things can be harmful to your health. Your health care provider may order lab tests to check your blood fat (lipid) and cholesterol levels. This helps your health care provider understand your risk for certain conditions and whether you need to make diet changes. Work with your health care provider or dietitian to make an eating plan that is right for you. Your plan includes: Limit your fat intake to ______% or less of your total calories a day. Limit your saturated fat intake to ______% or less of your total calories a day. Limit the amount of cholesterol in your diet to less than _________mg a day. Eat ___________ g of fiber a day. What  are tips for following this plan? General guidelines  If you are overweight, work with your health care provider to lose weight safely. Losing just 5-10% of your body weight can improve your overall health and help prevent diseases such as diabetes and heart disease. Avoid: Foods with added sugar. Fried foods. Foods that contain partially hydrogenated oils, including stick margarine, some tub margarines, cookies, crackers, and other baked goods. Limit alcohol intake to no more than 1 drink a day for nonpregnant women and 2 drinks a day for men. One drink equals 12 oz of beer, 5 oz of wine, or 1 oz of hard liquor. Reading food labels Check food labels for: Trans fats,  partially hydrogenated oils, or high amounts of saturated fat. Avoid foods that contain saturated fat and trans fat. The amount of cholesterol in each serving. Try to eat no more than 200 mg of cholesterol each day. The amount of fiber in each serving. Try to eat at least 20-30 g of fiber each day. Choose foods with healthy fats, such as: Monounsaturated and polyunsaturated fats. These include olive and canola oil, flaxseeds, walnuts, almonds, and seeds. Omega-3 fats. These are found in foods such as salmon, mackerel, sardines, tuna, flaxseed oil, and ground flaxseeds. Choose grain products that have whole grains. Look for the word "whole" as the first word in the ingredient list. Cooking Cook foods using methods other than frying. Baking, boiling, grilling, and broiling are some healthy options. Eat more home-cooked food and less restaurant, buffet, and fast food. Avoid cooking using saturated fats. Animal sources of saturated fats include meats, butter, and cream. Plant sources of saturated fats include palm oil, palm kernel oil, and coconut oil. Meal planning  At meals, imagine dividing your plate into fourths: Fill one-half of your plate with vegetables and green salads. Fill one-fourth of your plate with whole grains. Fill one-fourth of your plate with lean protein foods. Eat fish that is high in omega-3 fats at least two times a week. Eat more foods that contain fiber, such as whole grains, beans, apples, broccoli, carrots, peas, and barley. These foods help promote healthy cholesterol levels in the blood. Recommended foods Grains Whole grains, such as whole wheat or whole grain breads, crackers, cereals, and pasta. Unsweetened oatmeal, bulgur, barley, quinoa, or brown rice. Corn or whole wheat flour tortillas. Vegetables Fresh or frozen vegetables (raw, steamed, roasted, or grilled). Green salads. Fruits All fresh, canned (in natural juice), or frozen fruits. Meats and other  protein foods Ground beef (85% or leaner), grass-fed beef, or beef trimmed of fat. Skinless chicken or [place]. Ground chicken or [place]. Pork trimmed of fat. All fish and seafood. Egg whites. Dried beans, peas, or lentils. Unsalted nuts or seeds. Unsalted canned beans. Natural nut butters without added sugar and oil. Dairy Low-fat or nonfat dairy products, such as skim or 1% milk, 2% or reduced-fat cheeses, low-fat and fat-free ricotta or cottage cheese, or plain low-fat and nonfat yogurt. Fats and oils Tub margarine without trans fats. Light or reduced-fat mayonnaise and salad dressings. Avocado. Olive, canola, sesame, or safflower oils. The items listed above may not be a complete list of foods and beverages you can eat. Contact a dietitian for more information. Foods to avoid Grains White bread. White pasta. White rice. Cornbread. Bagels, pastries, and croissants. Crackers and snack foods that contain trans fat and hydrogenated oils. Vegetables Vegetables cooked in cheese, cream, or butter sauce. Fried vegetables. Fruits Canned fruit in heavy syrup. Fruit in cream or butter  sauce. Maceo Pro fruit. Meats and other protein foods Fatty cuts of meat. Ribs, chicken wings, bacon, sausage, bologna, salami, chitterlings, fatback, hot dogs, bratwurst, and packaged lunch meats. Liver and organ meats. Whole eggs and egg yolks. Chicken and [place] with skin. Fried meat. Dairy Whole or 2% milk, cream, half-and-half, and cream cheese. Whole milk cheeses. Whole-fat or sweetened yogurt. Full-fat cheeses. Nondairy creamers and whipped toppings. Processed cheese, cheese spreads, and cheese curds. Beverages Alcohol. Sugar-sweetened drinks such as sodas, lemonade, and fruit drinks. Fats and oils Butter, stick margarine, lard, shortening, ghee, or bacon fat. Coconut, palm kernel, and palm oils. Sweets and desserts Corn syrup, sugars, honey, and molasses. Candy. Jam and jelly. Syrup. Sweetened cereals. Cookies, pies,  cakes, donuts, muffins, and ice cream. The items listed above may not be a complete list of foods and beverages you should avoid. Contact a dietitian for more information. Summary Your body needs fat and cholesterol for basic functions. However, eating too much of these things can be harmful to your health. Work with your health care provider and dietitian to follow a diet low in fat and cholesterol. Doing this may help lower your risk for heart disease and other conditions. Choose healthy fats, such as monounsaturated and polyunsaturated fats, and foods high in omega-3 fatty acids. Eat fiber-rich foods, such as whole grains, beans, peas, fruits, and vegetables. Limit or avoid alcohol, fried foods, and foods high in saturated fats, partially hydrogenated oils, and sugar. This information is not intended to replace advice given to you by your health care provider. Make sure you discuss any questions you have with your health care provider. Document Revised: 06/24/2020 Document Reviewed: 02/26/2020 Elsevier Patient Education  2022 [employer].

## 2021-08-27 NOTE — Progress Notes (Signed)
HPI: [name] is a 87 y.o. male with hx of peripheral vascular disease, MCI/dementia (SLUMS 14/30, 12/2020), hypertension, moderate LVH, renal arterial stenosis, BPH with luts, PE on apix 2.5, pulm nodules, cerviclagia,  hearing loss, neuropathy, GERD, vit D deficiency, and OA here today for chronic disease management, CPE,and AWV.  Last AWV 08/05/20. Last CPE 06/24/20.  He lives alone. His son and daughter check on him regularly, weekly. He drives around town. Attends church once per week.  Independent ADL's and IADL's. Sleeps about 4-5 hours.  Functional Status Survey: Is the patient deaf or have difficulty hearing?: Yes (Hearing aids) Does the patient have difficulty seeing, even when wearing glasses/contacts?: No Does the patient have difficulty concentrating, remembering, or making decisions?: No Does the patient have difficulty walking or climbing stairs?: No Does the patient have difficulty dressing or bathing?: No Does the patient have difficulty doing errands alone such as visiting a doctor's office or shopping?: No  He had a fall yesterday about 2-3 pm when he was at church,"stumbled" against something and fell.  Denies head trauma or LOC. He was able to get up with no help. Left rib cage soreness, he has not noted edema,deformity, or ecchymosis.  Fall Risk  08/27/2021 08/05/2020 03/23/2020 12/05/2017 08/25/2015  Falls in the past year? 1 0 0 No No  Number falls in past yr: 0 0 0 - -  Injury with Fall? 0 0 0 - -  Risk for fall due to : - Orthopedic patient - - -  Follow up Education provided - - - -   Providers he sees regularly: Eye care provider: He does not remember name. A couple months ago because "black dots" in visual field. According to pt, exam was negative for serious process. Cataracts. [facility].  Depression screen [facility] 2/9 08/27/2021  Decreased Interest 0  Down, Depressed, Hopeless 0  PHQ - 2 Score  0    Mini-Cog - 08/27/21 1210     Normal clock drawing test? no   Asked for 11:10 and drew 12:05   How many words correct? 3   2 attempts           Vision Screening   Right eye Left eye Both eyes  Without correction 20/20 20/20 20/20   With correction      Hyperlipidemia: Currently on Atorvastatin 10 mg daily. Following a low fat diet: Yes. Side effects from medication:None Lab Results  Component Value Date   CHOL 159 03/23/2020   HDL 72.20 03/23/2020   LDLCALC 74 03/23/2020   LDLDIRECT 111.0 08/14/2009   TRIG 63.0 03/23/2020   CHOLHDL 2 03/23/2020   HTN: Currently on Losartan 50 mg, dose recently increased at the [place] from 25 mg. Following a low fat diet: Last time he checked it 170/?, usually 140's/50's. Side effects from medication: None. He has an appointment with [facility] provider in 2 months.  MCI: "Sometimes" he has difficulty remembering things but in general stable and not affecting daily activities. He states that he pays his bills and usually he pays on time. He drives and has not gotten lose in town.  Bilateral femoral artery atherosclerosis seen on imaging in 11/2015. Unprovoked PE in 2016.  Currently is on Eliquis 2.5 mg twice daily. He has not noted increased in bruising,gross hematuria,or blood in stool.  BPH: Currently he is on Flomax 0.4 mg daily, which is helping with nocturia.  Walking 3 times per week, 0.5 miles,  takes him 20 min. He cooks at home. Breakfast examples:Eggs and grits,bacon,sausage, or cereal. He eats breakfast around 11 Am. Snacks around 3 pm. Dinner around 7-7:30 pm: Beets,collard greens, and chicken. Sometimes squash,mash potatoes,and bake potatoes.   Review of Systems  Constitutional:  Negative for activity change, appetite change and fever.  HENT:  Negative for nosebleeds and sore throat.   Eyes:  Negative for redness and visual disturbance.  Respiratory:  Negative for cough, shortness of breath and wheezing.   Cardiovascular:   Negative for chest pain, palpitations and leg swelling.  Gastrointestinal:  Negative for abdominal pain, nausea and vomiting.  Endocrine: Negative for cold intolerance, heat intolerance, polydipsia, polyphagia and polyuria.  Genitourinary:  Negative for decreased urine volume and dysuria.  Musculoskeletal:  Positive for arthralgias. Negative for joint swelling.  Neurological:  Negative for syncope, weakness and headaches.  Psychiatric/Behavioral:  Negative for hallucinations. The patient is not nervous/anxious.   All other systems reviewed and are negative.  Current Outpatient Medications on File Prior to Visit  Medication Sig Dispense Refill   acetaminophen (TYLENOL) 325 MG tablet Take 650 mg by mouth every 4 (four) hours as needed for mild pain or moderate pain.      apixaban (ELIQUIS) 5 MG TABS tablet Take 2.5 mg by mouth 2 (two) times daily.     atorvastatin (LIPITOR) 20 MG tablet Take 10 mg by mouth daily.     losartan (COZAAR) 25 MG tablet Take 25 mg by mouth daily.     No current facility-administered medications on file prior to visit.    Past Medical History:  Diagnosis Date   Arthritis    BENIGN PROSTATIC HYPERTROPHY 02/13/2008   COLONIC POLYPS 01/04/2008   Diverticulosis of colon (without mention of hemorrhage) 01/04/2008   Frequent headaches    GERD 11/17/2010   Hyperlipidemia    Internal hemorrhoids with other complication 06/17/9146   PROSTATITIS, RECURRENT 01/04/2008   Pulmonary embolism ([place])    PVD 08/17/2010   TOBACCO USE, QUIT 08/14/2009   Allergies  Allergen Reactions   Terazosin    Social History   Socioeconomic History   Marital status: Single    Spouse name: Not on file   Number of children: 3   Years of education: Not on file   Highest education level: Not on file  Occupational History   Not on file  Tobacco Use   Smoking status: Former    Types: Cigarettes    Quit date: 69    Years since quitting: 64.8   Smokeless tobacco: Never  Substance and  Sexual Activity   Alcohol use: No   Drug use: No   Sexual activity: Not on file  Other Topics Concern   Not on file  Social History Narrative   Not on file   Social Determinants of Health   Financial Resource Strain: Low Risk    Difficulty of Paying Living Expenses: Not hard at all  Food Insecurity: Not on file  Transportation Needs: No Transportation Needs   Lack of Transportation (Medical): No   Lack of Transportation (Non-Medical): No  Physical Activity: Not on file  Stress: Not on file  Social Connections: Not on file   Vitals:   08/27/21 1146  BP: 130/80  Pulse: 62  Resp: 16  SpO2: 98%   Body mass index is 22.51 kg/m.  Physical Exam Vitals and nursing note reviewed.  Constitutional:      General: He is not in acute distress.    Appearance: He is  well-developed.  HENT:     Head: Atraumatic.     Right Ear: External ear normal.     Left Ear: External ear normal.     Ears:     Comments: Hearing aids. Eyes:     Conjunctiva/sclera: Conjunctivae normal.     Pupils: Pupils are equal, round, and reactive to light.  Neck:     Thyroid: No thyromegaly.     Trachea: No tracheal deviation.  Cardiovascular:     Rate and Rhythm: Normal rate and regular rhythm. Extrasystoles are present.    Heart sounds: Murmur (SEM I/VI RUSB and LUSB) heard.     Comments: Present DP pulses bilateral. Pulmonary:     Effort: Pulmonary effort is normal. No respiratory distress.     Breath sounds: Normal breath sounds.  Abdominal:     Palpations: Abdomen is soft. There is no hepatomegaly or mass.     Tenderness: There is no abdominal tenderness.  Musculoskeletal:        General: No tenderness.     Cervical back: Normal range of motion.     Comments: No signs of synovitis.  Lymphadenopathy:     Cervical: No cervical adenopathy.     Upper Body:     Right upper body: No supraclavicular adenopathy.     Left upper body: No supraclavicular adenopathy.  Skin:    General: Skin is warm.      Findings: No erythema.  Neurological:     Mental Status: He is alert and oriented to person, place, and time.     Cranial Nerves: No cranial nerve deficit.     Sensory: No sensory deficit.     Comments: Mildly unstable gait, not assisted.   ASSESSMENT AND PLAN:  Mr.Kessler was seen today for medicare wellness and annual exam.  Diagnoses and all orders for this visit:  Medicare annual wellness visit, subsequent We discussed the importance of staying active, physically and mentally, as well as the benefits of a healthy/balance diet. Low impact exercise that involve stretching and strengthing are ideal. Vaccines up to date. We discussed preventive screening for the next 5-10 years, summery of recommendations given in AVS.   Goals      Manage My Medicine     Try to walk 10-15 min every other day. Fall precautions. Continue monitoring blood pressure.     Prevent falls              This is a list of the screening recommended for you and due dates:  Health Maintenance  Topic Date Due   COVID-19 Vaccine (3 - Booster for Pfizer series) 09/12/2021*   Tetanus Vaccine  11/11/2025   Pneumonia Vaccine  Completed   Flu Shot  Completed   Zoster (Shingles) Vaccine  Completed   HPV Vaccine  Aged Out  *Topic was postponed. The date shown is not the original due date.   Fall prevention. Advance directives and end of life discussed, he has POA and living will.   Routine general medical examination at a health care facility We discussed the importance of regular physical activity and healthy diet for prevention of chronic illness and/or complications. Preventive guidelines reviewed. Next CPE in a year.  Dyslipidemia LDL ideal goal < 70. Continue Atorvastatin 10 mg daily. He is not fasting today, so will plan on FLP next visit,if not done through the [place].  Essential (primary) hypertension BP today adequately controlled. Continue Losartan 50 mg daily and low salt diet. Continue  monitoring BP at  home. He has f/u appt at the [place].  PVD Continue Atorvastatin 10 mg daily. On chronic anticoagulation with Eliquis for PE. He has not seen cardiologist/vascular in about 2 years.  Return in 1 year (on 08/27/2022) for awv/cpe.  Ellin Fitzgibbons G. [place], MD  [facility]. [place] office.

## 2021-09-13 ENCOUNTER — Telehealth: Payer: Self-pay | Admitting: Pharmacist

## 2021-09-13 NOTE — Chronic Care Management (AMB) (Signed)
Chronic Care Management Pharmacy Assistant   Name: Connor Neal  MRN: [id number] DOB: 01-12-1931   Reason for Encounter: Disease State/ Hypertension Assessment Call.    Conditions to be addressed/monitored: HTN   Recent office visits:  08/27/21 Betty Martinique MD (PCP) - seen for medicare annual wellness visit and chronic conditions. No medication changes. Follow up in 1 year.  Recent consult visits:  08/13/21 Ronald Pippins [facility] and [place]) - seen for immunization visit. Continue keeping a log of blood pressures 3 times a week. Nurse will call patient in 1 month to ask about them.   Hospital visits:  None in previous 6 months  Medications: Outpatient Encounter Medications as of 09/13/2021  Medication Sig   acetaminophen (TYLENOL) 325 MG tablet Take 650 mg by mouth every 4 (four) hours as needed for mild pain or moderate pain.    apixaban (ELIQUIS) 5 MG TABS tablet Take 2.5 mg by mouth 2 (two) times daily.   atorvastatin (LIPITOR) 20 MG tablet Take 10 mg by mouth daily.   losartan (COZAAR) 25 MG tablet Take 25 mg by mouth daily.   tamsulosin (FLOMAX) 0.4 MG CAPS capsule Take 0.4 mg by mouth.   No facility-administered encounter medications on file as of 09/13/2021.   Fill History: Patient uses the [place] for medications.  Reviewed chart prior to disease state call. Spoke with patient regarding BP  Recent Office Vitals: BP Readings from Last 3 Encounters:  08/27/21 130/80  06/11/21 125/65  05/27/21 (!) 171/73   Pulse Readings from Last 3 Encounters:  08/27/21 62  06/11/21 (!) 55  05/27/21 (!) 49    Wt Readings from Last 3 Encounters:  08/27/21 161 lb 6 oz (73.2 kg)  06/11/21 152 lb (68.9 kg)  05/27/21 161 lb (73 kg)     Kidney Function Lab Results  Component Value Date/Time   CREATININE 1.19 03/23/2020 12:57 PM   CREATININE 1.35 (H) 12/11/2019 12:52 PM   GFR 69.97 03/23/2020 12:57 PM   GFRNONAA 47 (L) 12/11/2019  12:52 PM   GFRAA 55 (L) 12/11/2019 12:52 PM    BMP Latest Ref Rng & Units 03/23/2020 12/11/2019 12/28/2017  Glucose 70 - 99 mg/dL 87 196(H) 91  BUN 6 - 23 mg/dL 25(H) 18 21  Creatinine 0.40 - 1.50 mg/dL 1.19 1.35(H) 1.27  Sodium 135 - 145 mEq/L 139 142 141  Potassium 3.5 - 5.1 mEq/L 4.5 4.4 4.7  Chloride 96 - 112 mEq/L 103 106 105  CO2 19 - 32 mEq/L 28 27 32  Calcium 8.4 - 10.5 mg/dL 9.1 9.6 9.3    Current antihypertensive regimen:  Losartan 25mg  - take 1 tablet by mouth daily.  How often are you checking your Blood Pressure? 3-5x per week Current home BP readings: was 126/74 on 09/12/21, 09/11/21 was 147/68, 09/10/21 was 134/62 and 09/09/21 was 161/64, 09/08/21 was 115/46. What recent interventions/DTPs have been made by any provider to improve Blood Pressure control since last CPP Visit: None. Any recent hospitalizations or ED visits since last visit with CPP? No  Adherence Review: Is the patient currently on ACE/ARB medication? Yes Does the patient have >5 day gap between last estimated fill dates? No  Notes: Spoke with patient and reviewed all medications as prescribed. Patient reports he takes all medications and no issues at this time. Patient stated he gets all of his medications from the [place] for free. Patient has been checking his blood pressure daily. Patient does not add salt  to his food and he does not eat a lot of red meat. Patient drinks plenty of water daily about 2-3 bottles. Patient does raking in his yard and walks 3 times weekly. Patient thanked me for my call.  Care Gaps:  AWV - completed on 08/27/21 Covid-19 vaccine booster 3 - overdue since 06/07/20 Flu vaccine - due  Star Rating Drugs:  Atorvastatin 20mg  - [facility] Losartan 25mg  - [place]  Clinical Pharmacist Assistant [phone]

## 2021-12-16 ENCOUNTER — Telehealth: Payer: Self-pay | Admitting: Pharmacist

## 2021-12-16 NOTE — Chronic Care Management (AMB) (Signed)
° ° °  Chronic Care Management Pharmacy Assistant   Name: Connor Neal  MRN: [id number] DOB: [birth year]  12/20/2021 APPOINTMENT REMINDER  Called [name], No answer, left message of appointment on 12/20/2021 at 3:00 via telephone visit with Jeni Salles, Pharm D. Notified to have all medications, supplements, blood pressure and/or blood sugar logs available during appointment and to return call if need to reschedule.  Care Gaps: AWV - completed on 08/27/21 Last BP - 130/80 on 08/27/2021 Covid booster - overdue  Star Rating Drug: Atorvastatin 20mg  - filled at the [facility] Losartan 25mg  - filled at the [place]  Any gaps in medications fill history? unknown  [place] Pharmacist Assistant [phone]

## 2021-12-20 ENCOUNTER — Ambulatory Visit (INDEPENDENT_AMBULATORY_CARE_PROVIDER_SITE_OTHER): Payer: Medicare Other | Admitting: Pharmacist

## 2021-12-20 DIAGNOSIS — I10 Essential (primary) hypertension: Secondary | ICD-10-CM

## 2021-12-20 DIAGNOSIS — E785 Hyperlipidemia, unspecified: Secondary | ICD-10-CM

## 2021-12-20 NOTE — Progress Notes (Signed)
Chronic Care Management Pharmacy Note  12/20/2021 Name:  Meric Joye MRN:  [id number] DOB:  17-Dec-1930  Summary: BP is not at goal < 140/90  LDL not at goal < 70  Recommendations/Changes made from today's visit: -Recommended bringing BP cuff into office to ensure accuracy -Recommended taking tamsulosin daily instead of as needed  Plan: BP assessment in 2-3 months  Subjective: Eliyah Bazzi is an 88 y.o. year old male who is a primary patient of Martinique, Malka So, MD.  The CCM team was consulted for assistance with disease management and care coordination needs.    Engaged with patient by telephone for follow up visit in response to provider referral for pharmacy case management and/or care coordination services.   Consent to Services:  The patient was given information about Chronic Care Management services, agreed to services, and gave verbal consent prior to initiation of services.  Please see initial visit note for detailed documentation.   Patient Care Team: Martinique, Betty G, MD as PCP - General (Family Medicine) Viona Gilmore, [facility] as Pharmacist (Pharmacist)  Recent office visits: 08/27/21 Betty Martinique MD (PCP) - seen for medicare annual wellness visit and chronic conditions. No medication changes. Follow up in 1 year.  Recent consult visits: 08/13/21 Ronald Pippins [facility] [place]) - seen for immunization visit. Continue keeping a log of blood pressures 3 times a week. Nurse will call patient in 1 month to ask about them  07/09/21 Ronald Pippins [facility] [place]) - seen for dementia, abdominal pain and other chronic issues. No medication changes or follow up noted.   06/11/21 Cheryll Cockayne, MD (orthopedic surgery): Patient presented for pain in toe on right foot.  05/27/21 Benjiman Core PA-C (Orthopedic Surgery) - pain in right foot. No medication changes. No follow up noted.   03/08/21  Shalini Ramasunder  -  [facility] [employer] [facility]. Seen for knee injection and serum biopsy of knee. Follow up as needed.   01/25/21 Scot Jun PT - physical therapy for chronic left shoulder pain and other issues. No medication changes. No follow up noted.   12/17/20 [place] -  patient seen for a pre surgical evaluation for peripheral vascular disease. No medication changes or follow up noted.  Hospital visits: 02/04/21 Patient admitted for day surgery. Unable to access notes.    Objective:  Lab Results  Component Value Date   CREATININE 1.19 03/23/2020   BUN 25 (H) 03/23/2020   GFR 69.97 03/23/2020   GFRNONAA 47 (L) 12/11/2019   GFRAA 55 (L) 12/11/2019   NA 139 03/23/2020   K 4.5 03/23/2020   CALCIUM 9.1 03/23/2020   CO2 28 03/23/2020   GLUCOSE 87 03/23/2020    Lab Results  Component Value Date/Time   GFR 69.97 03/23/2020 12:57 PM   GFR 69.35 12/28/2017 01:10 PM    Last diabetic Eye exam: No results found for: HMDIABEYEEXA  Last diabetic Foot exam: No results found for: HMDIABFOOTEX   Lab Results  Component Value Date   CHOL 159 03/23/2020   HDL 72.20 03/23/2020   LDLCALC 74 03/23/2020   LDLDIRECT 111.0 08/14/2009   TRIG 63.0 03/23/2020   CHOLHDL 2 03/23/2020    Hepatic Function Latest Ref Rng & Units 03/23/2020 12/28/2017 08/25/2015  Total Protein 6.0 - 8.3 g/dL 6.9 6.5 6.7  Albumin 3.5 - 5.2 g/dL 3.9 3.4(L) 3.7  AST 0 - 37 U/L 20  17 17  ALT 0 - 53 U/L '10 9 9  ' Alk Phosphatase 39 - 117 U/L 96 86 69  Total Bilirubin 0.2 - 1.2 mg/dL 0.6 0.5 0.6  Bilirubin, Direct 0.0 - 0.3 mg/dL - - -    Lab Results  Component Value Date/Time   TSH 1.23 12/28/2017 01:10 PM   TSH 0.80 08/25/2015 11:37 AM    CBC Latest Ref Rng & Units 05/27/2021 12/11/2019 12/28/2017  WBC 3.8 - 10.8 Thousand/uL 5.9 4.4 4.9  Hemoglobin 13.2 - 17.1 g/dL 12.8(L) 13.2 12.7(L)  Hematocrit 38.5 - 50.0 % 39.2 41.6 38.5(L)  Platelets 140 - 400 Thousand/uL 192 162  191.0    No results found for: VD25OH  Clinical ASCVD: Yes  The ASCVD Risk score (Arnett DK, et al., 2019) failed to calculate for the following reasons:   The 2019 ASCVD risk score is only valid for ages 37 to 55    Depression screen PHQ 2/9 08/27/2021 08/05/2020 03/23/2020  Decreased Interest 0 0 0  Down, Depressed, Hopeless 0 0 0  PHQ - 2 Score 0 0 0      Social History   Tobacco Use  Smoking Status Former   Types: Cigarettes   Quit date: 1958   Years since quitting: 65.1  Smokeless Tobacco Never   BP Readings from Last 3 Encounters:  08/27/21 130/80  06/11/21 125/65  05/27/21 (!) 171/73   Pulse Readings from Last 3 Encounters:  08/27/21 62  06/11/21 (!) 55  05/27/21 (!) 49   Wt Readings from Last 3 Encounters:  08/27/21 161 lb 6 oz (73.2 kg)  06/11/21 152 lb (68.9 kg)  05/27/21 161 lb (73 kg)   BMI Readings from Last 3 Encounters:  08/27/21 22.51 kg/m  06/11/21 21.20 kg/m  05/27/21 22.45 kg/m    Assessment/Interventions: Review of patient past medical history, allergies, medications, health status, including review of consultants reports, laboratory and other test data, was performed as part of comprehensive evaluation and provision of chronic care management services.   SDOH:  (Social Determinants of Health) assessments and interventions performed: Yes   SDOH Screenings   Alcohol Screen: Not on file  Depression (PHQ2-9): Low Risk    PHQ-2 Score: 0  Financial Resource Strain: Low Risk    Difficulty of Paying Living Expenses: Not hard at all  Food Insecurity: Not on file  Housing: Not on file  Physical Activity: Not on file  Social Connections: Not on file  Stress: Not on file  Tobacco Use: Medium Risk   Smoking Tobacco Use: Former   Smokeless Tobacco Use: Never   Passive Exposure: Not on file  Transportation Needs: No Transportation Needs   Lack of Transportation (Medical): No   Lack of Transportation (Non-Medical): No    CCM Care  Plan  Allergies  Allergen Reactions   Terazosin     Medications Reviewed Today     Reviewed by Viona Gilmore, [facility] (Pharmacist) on 12/20/21 at 1516  Med List Status: <None>   Medication Order Taking? Sig Documenting Provider Last Dose Status Informant  acetaminophen (TYLENOL) 325 MG tablet [id number]  Take 650 mg by mouth every 4 (four) hours as needed for mild pain or moderate pain.  [provider]  Active Multiple Informants  apixaban (ELIQUIS) 2.5 MG TABS tablet [id number] Yes Take 2.5 mg by mouth 2 (two) times daily. [provider] Taking Active Multiple Informants  atorvastatin (LIPITOR) 20 MG tablet [id number] Yes Take 10 mg by mouth daily. [provider] Taking  Active   losartan (COZAAR) 50 MG tablet [id number] Yes Take by mouth daily. [provider] Taking Active   tamsulosin (FLOMAX) 0.4 MG CAPS capsule [id number] Yes Take 0.4 mg by mouth. 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   Dyslipidemia 08/22/2012   GERD 11/17/2010   PVD 08/17/2010   OTHER SYMPTOMS INVOLVING CARDIOVASCULAR SYSTEM 07/01/2010   NECK PAIN 06/30/2010   TOBACCO USE, QUIT 08/14/2009   ABDOMINAL PAIN 11/14/2008   COLONIC POLYPS, HX OF 08/14/2008   BPH associated with nocturia 02/13/2008   INTERNAL HEMORRHOIDS WITH OTHER COMPLICATION 46/96/2952   Diverticulosis of colon (without mention of hemorrhage) 01/04/2008   PROSTATITIS, RECURRENT 01/04/2008    Immunization History  Administered Date(s) Administered   Fluad Quad(high Dose 65+) 08/05/2020, 08/13/2021   Influenza Whole 08/14/2008, 08/17/2010   Influenza, High Dose Seasonal PF  08/06/2015, 08/26/2016   Influenza-Unspecified 08/19/2014, 08/06/2015   PFIZER(Purple Top)SARS-COV-2 Vaccination 12/14/2019, 01/06/2020   Pneumococcal Conjugate-13 09/02/2014   Pneumococcal Polysaccharide-23 11/12/2015   Tdap 11/12/2015   Zoster Recombinat (Shingrix) 07/07/2020, 01/01/2021   Zoster, Live 08/26/2016   Patient reports his BP has been higher lately with the top number in the 140-150s.  Conditions to be addressed/monitored:  Hyperlipidemia, GERD, BPH, and ED, history of PE  Conditions addressed this visit: Hypertension, hyperlipidemia  Care Plan : [place]  Updates made by Viona Gilmore, [place] since 12/20/2021 12:00 AM     Problem: Problem: Hypertension, Hyperlipidemia, GERD, BPH, and ED, history of PE      Long-Range Goal: Patient-Specific Goal   Start Date: 06/16/2021  Expected End Date: 06/16/2022  Recent Progress: On track  Priority: High  Note:   Current Barriers:  Unable to independently monitor therapeutic efficacy Unable to maintain control of blood pressure  Pharmacist Clinical Goal(s):  Patient will achieve adherence to monitoring guidelines and medication adherence to achieve therapeutic efficacy maintain control of blood pressure as evidenced by home BP readings  through collaboration with PharmD and provider.   Interventions: 1:1 collaboration with Martinique, Betty G, MD regarding development and update of comprehensive plan of care as evidenced by provider attestation and co-signature Inter-disciplinary care team collaboration (see longitudinal plan of care) Comprehensive medication review performed; medication list updated in electronic medical record  Hypertension (BP goal <140/90) -Not ideally controlled -Current treatment: Losartan 50 mg 1 tablet daily - Appropriate, Query effective, Safe, Accessible -Medications previously tried: none -Current home readings: 140-150s; checking every other day or about 3 days a week  -Current  dietary habits: patient uses very little salt and does look at package labels; he has cut back a lot on salt lately (arm cuff) -Current exercise habits: active during the day -Denies hypotensive/hypertensive symptoms -Educated on BP goals and benefits of medications for prevention of heart attack, stroke and kidney damage; Importance of home blood pressure monitoring; Proper BP monitoring technique; -Counseled to monitor BP at home weekly, document, and provide log at future appointments -Counseled on diet and exercise extensively Recommended to continue current medication  Hyperlipidemia/PVD: (LDL goal < 70) -Controlled -Current treatment: Atorvastatin 20 mg 1/2 tablet daily - Appropriate, Effective, Safe, Accessible -Medications previously  tried: pravastatin -Current dietary patterns: refer to above -Current exercise habits: active some during the day -Educated on Cholesterol goals;  Benefits of statin for ASCVD risk reduction; Importance of limiting foods high in cholesterol; -Counseled on diet and exercise extensively Recommended to continue current medication Recommended repeat lipid panel and consider increasing atorvastatin to 20 mg daily.  BPH (Goal: minimize symptoms of enlarged prostate) -Controlled -Current treatment  Tamsulosin 0.4 mg 1 capsule daily - taking PRN - Appropriate, Query effective, Safe, Accessible -Medications previously tried: terazosin (unknown), tamsulosin (just stopped) -Counseled on benefits of taking tamsulosin daily rather than PRN and recommended for patient to restart taking daily.   History of PE (Goal: prevent blood clots) -Controlled -Current treatment  Eliquis 2.5 mg 1 tablet twice daily - Appropriate, Effective, Safe, Accessible -Medications previously tried: none  -Recommended to continue current medication  Pain (Goal: minimize pain) -Controlled -Current treatment  Acetaminophen 650 mg 1 tablet every 4 hours as needed - Appropriate,  Effective, Safe, Accessible -Medications previously tried: none  -Recommended to continue current medication Counseled on avoiding use of more than 3000 mg of Tylenol per day   Health Maintenance -Vaccine gaps: COVID booster (declines) -Current therapy:  Vitamin B12 1000 mcg daily -Educated on Cost vs benefit of each product must be carefully weighed by individual consumer -Patient is satisfied with current therapy and denies issues -Recommended to continue current medication  Patient Goals/Self-Care Activities Patient will:  - focus on medication adherence by using a pillbox check blood pressure weekly, document, and provide at future appointments  Follow Up Plan: Telephone follow up appointment with care management team member scheduled for: 6 months       Medication Assistance: None required.  Patient affirms current coverage meets needs.  Compliance/Adherence/Medication fill history: Care Gaps: COVID booster, influenza Last BP - 130/80 on 08/27/2021  Star-Rating Drugs: [facility]  [id number] [address]: [phone]: [phone] [place]one: [phone]: [phone] pill box? No - has one but doesn't use Pt endorses 95% compliance  We discussed: Current pharmacy is preferred with insurance plan and patient is satisfied with pharmacy services Patient decided to: Continue current medication management strategy  Care Plan and Follow Up Patient Decision:  Patient agrees to Care Plan and Follow-up.  Plan: Telephone follow up appointment with care management team member scheduled for:  6 months  MadJeni SallesharmD, BCAHighlandsarmacist [place]  [phone]

## 2021-12-20 NOTE — Patient Instructions (Signed)
Hi Albi,  It was great to get to catch up with you again! Below is a summary of some of the topics we discussed.   Please reach out to me if you have any questions or need anything before our follow up!  Best, Maddie  Jeni Salles, PharmD, [facility]   Visit Information   Goals Addressed   None    Patient Care Plan: CCM Pharmacy Care Plan     Problem Identified: Problem: Hypertension, Hyperlipidemia, GERD, BPH, and ED, history of PE      Long-Range Goal: Patient-Specific Goal   Start Date: 06/16/2021  Expected End Date: 06/16/2022  Recent Progress: On track  Priority: High  Note:   Current Barriers:  Unable to independently monitor therapeutic efficacy Unable to maintain control of blood pressure  Pharmacist Clinical Goal(s):  Patient will achieve adherence to monitoring guidelines and medication adherence to achieve therapeutic efficacy maintain control of blood pressure as evidenced by home BP readings  through collaboration with PharmD and provider.   Interventions: 1:1 collaboration with Martinique, Betty G, MD regarding development and update of comprehensive plan of care as evidenced by provider attestation and co-signature Inter-disciplinary care team collaboration (see longitudinal plan of care) Comprehensive medication review performed; medication list updated in electronic medical record  Hypertension (BP goal <140/90) -Not ideally controlled -Current treatment: Losartan 50 mg 1 tablet daily - Appropriate, Query effective, Safe, Accessible -Medications previously tried: none -Current home readings: 140-150s; checking every other day or about 3 days a week  -Current dietary habits: patient uses very little salt and does look at package labels; he has cut back a lot on salt lately (arm cuff) -Current exercise habits: active during the day -Denies hypotensive/hypertensive symptoms -Educated on BP  goals and benefits of medications for prevention of heart attack, stroke and kidney damage; Importance of home blood pressure monitoring; Proper BP monitoring technique; -Counseled to monitor BP at home weekly, document, and provide log at future appointments -Counseled on diet and exercise extensively Recommended to continue current medication  Hyperlipidemia/PVD: (LDL goal < 70) -Controlled -Current treatment: Atorvastatin 20 mg 1/2 tablet daily - Appropriate, Effective, Safe, Accessible -Medications previously tried: pravastatin -Current dietary patterns: refer to above -Current exercise habits: active some during the day -Educated on Cholesterol goals;  Benefits of statin for ASCVD risk reduction; Importance of limiting foods high in cholesterol; -Counseled on diet and exercise extensively Recommended to continue current medication Recommended repeat lipid panel and consider increasing atorvastatin to 20 mg daily.  BPH (Goal: minimize symptoms of enlarged prostate) -Controlled -Current treatment  Tamsulosin 0.4 mg 1 capsule daily - taking PRN - Appropriate, Query effective, Safe, Accessible -Medications previously tried: terazosin (unknown), tamsulosin (just stopped) -Counseled on benefits of taking tamsulosin daily rather than PRN and recommended for patient to restart taking daily.   History of PE (Goal: prevent blood clots) -Controlled -Current treatment  Eliquis 2.5 mg 1 tablet twice daily - Appropriate, Effective, Safe, Accessible -Medications previously tried: none  -Recommended to continue current medication  Pain (Goal: minimize pain) -Controlled -Current treatment  Acetaminophen 650 mg 1 tablet every 4 hours as needed - Appropriate, Effective, Safe, Accessible -Medications previously tried: none  -Recommended to continue current medication Counseled on avoiding use of more than 3000 mg of Tylenol per day   Health Maintenance -Vaccine gaps: COVID booster  (declines) -Current therapy:  Vitamin B12 1000 mcg daily -Educated on Cost vs benefit of each product must be carefully weighed  by individual consumer -Patient is satisfied with current therapy and denies issues -Recommended to continue current medication  Patient Goals/Self-Care Activities Patient will:  - focus on medication adherence by using a pillbox check blood pressure weekly, document, and provide at future appointments  Follow Up Plan: Telephone follow up appointment with care management team member scheduled for: 6 months       Patient verbalizes understanding of instructions and care plan provided today and agrees to view in [place]. Active MyChart status confirmed with patient.   Telephone follow up appointment with pharmacy team member scheduled for: 6 months  Viona Gilmore, [facility]

## 2022-01-04 DIAGNOSIS — I10 Essential (primary) hypertension: Secondary | ICD-10-CM

## 2022-01-04 DIAGNOSIS — E785 Hyperlipidemia, unspecified: Secondary | ICD-10-CM | POA: Diagnosis not present

## 2022-03-07 ENCOUNTER — Telehealth: Payer: Self-pay | Admitting: Pharmacist

## 2022-03-07 NOTE — Chronic Care Management (AMB) (Signed)
Chronic Care Management Pharmacy Assistant   Name: Lui Bellis  MRN: [id number] DOB: [birth year].  Reason for Encounter: Disease State / Hypertension Assessment Call   Conditions to be addressed/monitored: HTN  Recent office visits:  None  Recent consult visits:  None  Hospital visits:  None  Medications: Outpatient Encounter Medications as of 03/07/2022  Medication Sig   acetaminophen (TYLENOL) 325 MG tablet Take 650 mg by mouth every 4 (four) hours as needed for mild pain or moderate pain.    apixaban (ELIQUIS) 2.5 MG TABS tablet Take 2.5 mg by mouth 2 (two) times daily.   atorvastatin (LIPITOR) 20 MG tablet Take 10 mg by mouth daily.   losartan (COZAAR) 50 MG tablet Take by mouth daily.   tamsulosin (FLOMAX) 0.4 MG CAPS capsule Take 0.4 mg by mouth.   No facility-administered encounter medications on file as of 03/07/2022.  Fill History: TAMSULOSIN HCL 0.4 MG CAPSULE 04/24/2019 30   Reviewed chart prior to disease state call. Spoke with patient regarding BP  Recent Office Vitals: BP Readings from Last 3 Encounters:  08/27/21 130/80  06/11/21 125/65  05/27/21 (!) 171/73   Pulse Readings from Last 3 Encounters:  08/27/21 62  06/11/21 (!) 55  05/27/21 (!) 49    Wt Readings from Last 3 Encounters:  08/27/21 161 lb 6 oz (73.2 kg)  06/11/21 152 lb (68.9 kg)  05/27/21 161 lb (73 kg)     Kidney Function Lab Results  Component Value Date/Time   CREATININE 1.19 03/23/2020 12:57 PM   CREATININE 1.35 (H) 12/11/2019 12:52 PM   GFR 69.97 03/23/2020 12:57 PM   GFRNONAA 47 (L) 12/11/2019 12:52 PM   GFRAA 55 (L) 12/11/2019 12:52 PM       Latest Ref Rng & Units 03/23/2020   12:57 PM 12/11/2019   12:52 PM 12/28/2017    1:10 PM  BMP  Glucose 70 - 99 mg/dL 87   196   91    BUN 6 - 23 mg/dL '25   18   21    '$ Creatinine 0.40 - 1.50 mg/dL 1.19   1.35   1.27    Sodium 135 - 145 mEq/L 139   142   141    Potassium 3.5 - 5.1 mEq/L 4.5   4.4   4.7    Chloride 96 -  112 mEq/L 103   106   105    CO2 19 - 32 mEq/L 28   27   32    Calcium 8.4 - 10.5 mg/dL 9.1   9.6   9.3      Current antihypertensive regimen:  Losartan 50 mg 1 tablet daily  How often are you checking your Blood Pressure? Patient states he is checking his blood pressures 3 times a week.   Current home BP readings: Patient doesn't have a log however, his readings have been between 130/58 and 140/68.   What recent interventions/DTPs have been made by any provider to improve Blood Pressure control since last CPP Visit: No recent interventions   Any recent hospitalizations or ED visits since last visit with CPP? No recent hospital visits.   What diet changes have been made to improve Blood Pressure Control?  Patient doesn't follow any specific diet  Breakfast - patient will have cereal, grits or oatmeal Lunch - patient will have a full meal with a meat and vegetable Dinner - patient will have something light like a sandwich  What exercise is being done to improve  your Blood Pressure Control?  Patient states he is staying busy, he walks about 3 times daily, works in his garden and and activities with his church.   Adherence Review: Is the patient currently on ACE/ARB medication? Yes Does the patient have >5 day gap between last estimated fill dates? No  Care Gaps: AWV - completed on 08/27/21 Last BP - 130/80 on 08/27/2021 Covid booster - overdue   Star Rating Drug: Atorvastatin '20mg'$  - filled at the [facility] Losartan '25mg'$  - filled at the [place] Pharmacist Assistant [phone]

## 2022-06-17 ENCOUNTER — Telehealth: Payer: Self-pay

## 2022-06-17 ENCOUNTER — Telehealth: Payer: Self-pay | Admitting: Pharmacist

## 2022-06-17 DIAGNOSIS — I10 Essential (primary) hypertension: Secondary | ICD-10-CM

## 2022-06-17 NOTE — Telephone Encounter (Signed)
-----   Message from Connor Neal, [facility] sent at 06/17/2022  4:51 PM EDT ----- Regarding: CCM referral Hi,  Can you please put in a CCM referral for Mr. Vaden?  Thanks! Maddie

## 2022-06-17 NOTE — Chronic Care Management (AMB) (Signed)
    Chronic Care Management Pharmacy Assistant   Name: Abdallah Hern  MRN: [id number] DOB: Sep 13, 1931  06/20/2022 APPOINTMENT REMINDER  [name] was reminded to have all medications, supplements and any blood glucose and blood pressure readings available for review with Jeni Salles, Pharm. D, at his telephone visit on 06/20/2022 at 3:00.  Care Gaps: AWV - completed  08/27/2021 Last BP - 130/80 on 08/27/2021 Covid booster - overdue Flu - due  Star Rating Drug: Atorvastatin '20mg'$  [facility]  [facility]  Any gaps in medications fill history? None known  [facility]  Catering manager [phone]

## 2022-06-20 ENCOUNTER — Ambulatory Visit (INDEPENDENT_AMBULATORY_CARE_PROVIDER_SITE_OTHER): Payer: Medicare Other | Admitting: Pharmacist

## 2022-06-20 DIAGNOSIS — I10 Essential (primary) hypertension: Secondary | ICD-10-CM

## 2022-06-20 DIAGNOSIS — E785 Hyperlipidemia, unspecified: Secondary | ICD-10-CM

## 2022-06-20 NOTE — Progress Notes (Signed)
Chronic Care Management Pharmacy Note  06/20/2022 Name:  Connor Neal MRN:  [id number] DOB:  25-Dec-1930  Summary: BP is running low Pt stopped taking all of his medications except tamsulosin  Recommendations/Changes made from today's visit: -Recommended bringing BP cuff into office to ensure accuracy -[facility] -Recommended considering restarting Eliquis given history of PE  Plan: Scheduled CPE for 2 months Follow up as needed  Subjective: Connor Neal is an 88 y.o. year old male who is a primary patient of Neal, Malka So, MD.  The CCM team was consulted for assistance with disease management and care coordination needs.    Engaged with patient by telephone for follow up visit in response to provider referral for pharmacy case management and/or care coordination services.   Consent to Services:  The patient was given information about Chronic Care Management services, agreed to services, and gave verbal consent prior to initiation of services.  Please see initial visit note for detailed documentation.   Patient Care Team: Neal, Connor G, MD as PCP - General (Family Medicine) Viona Gilmore, [facility] as Pharmacist (Pharmacist)  Recent office visits: 08/27/21 Connor Martinique MD (PCP) - seen for medicare annual wellness visit and chronic conditions. No medication changes. Follow up in 1 year.  Recent consult visits: 04/29/22 Patient presented to [facility] for routine lab work. Vitamin D was low at 22.   03/01/22  [facility]): Patient presented for PAD follow up.  Repeat Carotid duplex in 1 year.  08/13/21 Connor Neal ([place]) - seen for immunization visit. Continue keeping a log of blood pressures 3 times a week. Nurse will call patient in 1 month to ask about them  07/09/21 Connor Neal [facility]) - seen for dementia,  abdominal pain and other chronic issues. No medication changes or follow up noted.   06/11/21 Connor Cockayne, MD (orthopedic surgery): Patient presented for pain in toe on right foot.  05/27/21 Connor Core PA-C (Orthopedic Surgery) - pain in right foot. No medication changes. No follow up noted.   03/08/21 Connor Neal  -  [facility]. Seen for knee injection and serum biopsy of knee. Follow up as needed.   01/25/21 Connor Neal PT - physical therapy for chronic left shoulder pain and other issues. No medication changes. No follow up noted.   12/17/20 Connor Neal -  patient seen for a pre surgical evaluation for peripheral vascular disease. No medication changes or follow up noted.  Hospital visits: 02/04/21 Patient admitted for day surgery. Unable to access notes.    Objective:  Lab Results  Component Value Date   CREATININE 1.19 03/23/2020   BUN 25 (H) 03/23/2020   GFR 69.97 03/23/2020   GFRNONAA 47 (L) 12/11/2019   GFRAA 55 (L) 12/11/2019   NA 139 03/23/2020   K 4.5 03/23/2020   CALCIUM 9.1 03/23/2020   CO2 28 03/23/2020   GLUCOSE 87 03/23/2020    Lab Results  Component Value Date/Time   GFR 69.97 03/23/2020 12:57 PM   GFR 69.35 12/28/2017 01:10 PM    Last diabetic Eye exam: No results found for: "HMDIABEYEEXA"  Last diabetic Foot exam: No results found for: "HMDIABFOOTEX"   Lab Results  Component Value Date   CHOL 159 03/23/2020   HDL 72.20 03/23/2020   LDLCALC 74 03/23/2020   LDLDIRECT 111.0 08/14/2009  TRIG 63.0 03/23/2020   CHOLHDL 2 03/23/2020       Latest Ref Rng & Units 03/23/2020   12:57 PM 12/28/2017    1:10 PM 08/25/2015   11:37 AM  Hepatic Function  Total Protein 6.0 - 8.3 g/dL 6.9  6.5  6.7   Albumin 3.5 - 5.2 g/dL 3.9  3.4  3.7   AST 0 - 37 U/L _0 ALT 0 - 53 U/L _1 Alk Phosphatase 39 - 117 U/L 96  86  69   Total Bilirubin 0.2 - 1.2 mg/dL 0.6  0.5  0.6     Lab Results   Component Value Date/Time   TSH 1.23 12/28/2017 01:10 PM   TSH 0.80 08/25/2015 11:37 AM       Latest Ref Rng & Units 05/27/2021    3:30 PM 12/11/2019   12:52 PM 12/28/2017    1:10 PM  CBC  WBC 3.8 - 10.8 Thousand/uL 5.9  4.4  4.9   Hemoglobin 13.2 - 17.1 g/dL 12.8  13.2  12.7   Hematocrit 38.5 - 50.0 % 39.2  41.6  38.5   Platelets 140 - 400 Thousand/uL 192  162  191.0     No results found for: "VD25OH"  Clinical ASCVD: Yes  The ASCVD Risk score (Arnett DK, et al., 2019) failed to calculate for the following reasons:   The 2019 ASCVD risk score is only valid for ages 38 to 65       08/27/2021   11:47 AM 08/05/2020    1:01 PM 03/23/2020   10:39 AM  Depression screen PHQ 2/9  Decreased Interest 0 0 0  Down, Depressed, Hopeless 0 0 0  PHQ - 2 Score 0 0 0      Social History   Tobacco Use  Smoking Status Former   Types: Cigarettes   Quit date: 1958   Years since quitting: 65.6  Smokeless Tobacco Never   BP Readings from Last 3 Encounters:  08/27/21 130/80  06/11/21 125/65  05/27/21 (!) 171/73   Pulse Readings from Last 3 Encounters:  08/27/21 62  06/11/21 (!) 55  05/27/21 (!) 49   Wt Readings from Last 3 Encounters:  08/27/21 161 lb 6 oz (73.2 kg)  06/11/21 152 lb (68.9 kg)  05/27/21 161 lb (73 kg)   BMI Readings from Last 3 Encounters:  08/27/21 22.51 kg/m  06/11/21 21.20 kg/m  05/27/21 22.45 kg/m    Assessment/Interventions: Review of patient past medical history, allergies, medications, health status, including review of consultants reports, laboratory and other test data, was performed as part of comprehensive evaluation and provision of chronic care management services.   SDOH:  (Social Determinants of Health) assessments and interventions performed: Yes   SDOH Screenings   Alcohol Screen: Not on file  Depression (PHQ2-9): Low Risk  (08/27/2021)   Depression (PHQ2-9)    PHQ-2 Score: 0  Financial Resource Strain: Low Risk  (07/02/2021)    Overall Financial Resource Strain (CARDIA)    Difficulty of Paying Living Expenses: Not hard at all  Food Insecurity: Not on file  Housing: Not on file  Physical Activity: Not on file  Social Connections: Not on file  Stress: Not on file  Tobacco Use: Medium Risk (08/27/2021)   Patient History    Smoking Tobacco Use: Former    Smokeless Tobacco Use: Never    Passive Exposure: Not on file  Transportation Needs: No Transportation Needs (07/02/2021)  PRAPARE - Hydrologist (Medical): No    Lack of Transportation (Non-Medical): No    CCM Care Plan  Allergies  Allergen Reactions   Terazosin     Medications Reviewed Today     Reviewed by Viona Gilmore, [facility] (Pharmacist) on 06/20/22 at 42  Med List Status: <None>   Medication Order Taking? Sig Documenting Provider Last Dose Status Informant  acetaminophen (TYLENOL) 325 MG tablet [id number]  Take 650 mg by mouth every 4 (four) hours as needed for mild pain or moderate pain.  [provider]  Active Multiple Informants  apixaban (ELIQUIS) 2.5 MG TABS tablet [id number] No Take 2.5 mg by mouth 2 (two) times daily.  Patient not taking: Reported on 06/20/2022   [provider] Not Taking Active Multiple Informants  atorvastatin (LIPITOR) 20 MG tablet [id number] No Take 10 mg by mouth daily.  Patient not taking: Reported on 06/20/2022   [provider] Not Taking Active   tamsulosin (FLOMAX) 0.4 MG CAPS capsule [id number] Yes Take 0.4 mg by mouth. 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   Dyslipidemia 08/22/2012   GERD 11/17/2010   PVD 08/17/2010    OTHER SYMPTOMS INVOLVING CARDIOVASCULAR SYSTEM 07/01/2010   NECK PAIN 06/30/2010   TOBACCO USE, QUIT 08/14/2009   ABDOMINAL PAIN 11/14/2008   COLONIC POLYPS, HX OF 08/14/2008   BPH associated with nocturia 02/13/2008   INTERNAL HEMORRHOIDS WITH OTHER COMPLICATION 54/27/0623   Diverticulosis of colon (without mention of hemorrhage) 01/04/2008   PROSTATITIS, RECURRENT 01/04/2008    Immunization History  Administered Date(s) Administered   Fluad Quad(high Dose 65+) 08/05/2020, 08/13/2021   Influenza Whole 08/14/2008, 08/17/2010   Influenza, High Dose Seasonal PF 08/06/2015, 08/26/2016   Influenza-Unspecified 08/19/2014, 08/06/2015   PFIZER(Purple Top)SARS-COV-2 Vaccination 12/14/2019, 01/06/2020   Pneumococcal Conjugate-13 09/02/2014   Pneumococcal Polysaccharide-23 11/12/2015   Tdap 11/12/2015   Zoster Recombinat (Shingrix) 07/07/2020, 01/01/2021   Zoster, Live 08/26/2016    Patient reports his heart has been aching for the last 2-3 weeks. Patient reports this doesn't change when he eats food. Patient did mention to the [place] doctors but they didn't seem be concerned. Patient reports they didn't think it was serious. Patient was doing more in his garden than usual and wasn't sure if this was the cause.  Patient has been checking his BP at home regularly. Patient is careful about how much salt he is eating. Patient doesn't feel any different today from his usual.   Conditions to be addressed/monitored:  Hyperlipidemia, GERD, BPH, and ED, history of PE  Conditions addressed this visit: Hypertension, hyperlipidemia  Care Plan : [place]  Updates made by Viona Gilmore, [place] since 06/20/2022 12:00 AM     Problem: Problem: Hypertension, Hyperlipidemia, GERD, BPH, and ED, history of PE      Long-Range Goal: Patient-Specific Goal   Start Date: 06/16/2021  Expected End Date: 06/16/2022  Recent Progress: On track  Priority: High  Note:   Current Barriers:  Unable to  independently monitor therapeutic efficacy Unable to maintain control of blood pressure  Pharmacist Clinical Goal(s):  Patient will achieve  adherence to monitoring guidelines and medication adherence to achieve therapeutic efficacy maintain control of blood pressure as evidenced by home BP readings  through collaboration with PharmD and provider.   Interventions: 1:1 collaboration with Neal, Connor G, MD regarding development and update of comprehensive plan of care as evidenced by provider attestation and co-signature Inter-disciplinary care team collaboration (see longitudinal plan of care) Comprehensive medication review performed; medication list updated in electronic medical record  Hypertension (BP goal <140/90) -Controlled -Current treatment: No medications -Medications previously tried: losartan (stopped by the [place]) -Current home readings: 111/51 HR 53, 134/58 HR 52, 115/52 HR 31, checking every other day or about 3 days a week, 154/74 (highest), just got a new arm cuff -Current dietary habits: patient uses very little salt and does look at package labels; he has cut back a lot on salt lately (arm cuff) -Current exercise habits: active during the day -Denies hypotensive/hypertensive symptoms -Educated on BP goals and benefits of medications for prevention of heart attack, stroke and kidney damage; Importance of home blood pressure monitoring; Proper BP monitoring technique; -Counseled to monitor BP at home weekly, document, and provide log at future appointments -Counseled on diet and exercise extensively Recommended to continue current medication  Hyperlipidemia/PVD: (LDL goal < 70) -Controlled -Current treatment: Atorvastatin 20 mg 1/2 tablet daily - not taking  -Medications previously tried: pravastatin -Current dietary patterns: refer to above -Current exercise habits: active some during the day -Educated on Cholesterol goals;  Benefits of statin for ASCVD risk  reduction; Importance of limiting foods high in cholesterol; -Counseled on diet and exercise extensively Recommended to continue current medication  BPH (Goal: minimize symptoms of enlarged prostate) -Controlled -Current treatment  Tamsulosin 0.4 mg 1 capsule daily - Appropriate, Effective, Safe, Accessible -Medications previously tried: terazosin (unknown), tamsulosin (just stopped) -Recommended to continue current medication  History of PE (Goal: prevent blood clots) -Controlled -Current treatment  Eliquis 2.5 mg 1 tablet twice daily - stopped taking -Medications previously tried: none  -Recommended to continue current medication  Pain (Goal: minimize pain) -Controlled -Current treatment  Acetaminophen 650 mg 1 tablet every 4 hours as needed - Appropriate, Effective, Safe, Accessible -Medications previously tried: none  -Recommended to continue current medication Counseled on avoiding use of more than 3000 mg of Tylenol per day   Health Maintenance -Vaccine gaps: COVID booster (declines) -Current therapy:  Vitamin B12 1000 mcg daily -Educated on Cost vs benefit of each product must be carefully weighed by individual consumer -Patient is satisfied with current therapy and denies issues -Recommended to continue current medication  Patient Goals/Self-Care Activities Patient will:  - focus on medication adherence by using a pillbox check blood pressure weekly, document, and provide at future appointments  Follow Up Plan: The patient has been provided with contact information for the care management team and has been advised to call with any health related questions or concerns.        Medication Assistance: None required.  Patient affirms current coverage meets needs.  Compliance/Adherence/Medication fill history: Care Gaps: COVID booster, influenza Last BP - 130/80 on 08/27/2021  Star-Rating Drugs: [facility]  [id number] [place] [address]: [phone]: [phone] [place]one: [phone]: [phone] pill box? No - has  one but doesn't use Pt endorses 95% compliance  We discussed: Current pharmacy is preferred with insurance plan and patient is satisfied with pharmacy services Patient decided to: Continue current medication management strategy  Care Plan and Follow Up Patient Decision:  Patient agrees to Care Plan and Follow-up.  Plan: The patient has been provided with contact information for the care management team and has been advised to call with any health related questions or concerns.   Jeni Salles, PharmD, [place] [facility]

## 2022-07-07 DIAGNOSIS — N400 Enlarged prostate without lower urinary tract symptoms: Secondary | ICD-10-CM

## 2022-07-07 DIAGNOSIS — I119 Hypertensive heart disease without heart failure: Secondary | ICD-10-CM | POA: Diagnosis not present

## 2022-07-07 DIAGNOSIS — E785 Hyperlipidemia, unspecified: Secondary | ICD-10-CM

## 2022-07-07 DIAGNOSIS — I739 Peripheral vascular disease, unspecified: Secondary | ICD-10-CM

## 2022-08-26 NOTE — Progress Notes (Unsigned)
HPI: [name] is a 88 y.o.male with medical hx significant for HTN,hx of PE, PAD,GERD, BPH, and complete heart block here today for his routine physical examination.  Last CPE: 08/27/21. He reports no new problems since his last visit last year.   States that he exercises regularly, walking three or four times per week for about 20 minutes.  He cooks at home, preparing meals with turnip greens, squash, pinto beans, green beans, cabbage, fish, chicken, and burger. He lives alone, but his daughter checks on him regularly.  He feels generally fine and is still driving.  He sleeps for four or five hours per night.  Immunization History  Administered Date(s) Administered   Fluad Quad(high Dose 65+) 08/05/2020, 08/13/2021, 08/29/2022   Influenza Whole 08/14/2008, 08/17/2010   Influenza, High Dose Seasonal PF 08/06/2015, 08/26/2016   Influenza-Unspecified 08/19/2014, 08/06/2015   PFIZER(Purple Top)SARS-COV-2 Vaccination 12/14/2019, 01/06/2020   Pneumococcal Conjugate-13 09/02/2014   Pneumococcal Polysaccharide-23 11/12/2015   Tdap 11/12/2015   Zoster Recombinat (Shingrix) 07/07/2020, 01/01/2021   Zoster, Live 08/26/2016    Health Maintenance  Topic Date Due   Medicare Annual Wellness (AWV)  Never done   COVID-19 Vaccine (3 - Pfizer series) 03/02/2020   TETANUS/TDAP  11/11/2025   Pneumonia Vaccine 40+ Years old  Completed   INFLUENZA VACCINE  Completed   Zoster Vaccines- Shingrix  Completed   HPV VACCINES  Aged Out   He has been seeing cardiologists through the [place].  Bradycardia, his heart rate today was 38, he mentions that he has not been checking his heart rate at home regularly. He has a blood pressure monitor and recalls his heart rate being 44 when he last checked it today. He is to follow with cardiologist in six months. He has stopped taking Atorvastatin 20 mg daily and Eliquis, he does not think he needs to take these medications. States that he has informed  his cardiologist about his decision to stop meds.  Complete AV block, he does not have a pacemaker;states that it was recommended but he declined. He denies experiencing chest pain, difficulty breathing, palpitations, or dizziness. However, he does report occasional mild chest discomfort while sitting down, resting, or working in his garden. This is not a new problems and he believes he has mentioned this to his cardiologist.  BPH: He is currently taking Flomax 0.4 mg daily.  Review of Systems  Constitutional:  Negative for activity change, appetite change, diaphoresis and fever.  HENT:  Negative for nosebleeds, sore throat and trouble swallowing.   Eyes:  Negative for redness and visual disturbance.  Respiratory:  Negative for cough, shortness of breath and wheezing.   Cardiovascular:  Negative for palpitations and leg swelling.  Gastrointestinal:  Negative for abdominal pain, nausea and vomiting.  Genitourinary:  Negative for decreased urine volume, dysuria and hematuria.  Musculoskeletal:  Positive for arthralgias. Negative for gait problem.  Skin:  Negative for rash.  Neurological:  Negative for seizures, syncope, weakness and headaches.  Psychiatric/Behavioral:  Negative for confusion. The patient is not nervous/anxious.    Current Outpatient Medications on File Prior to Visit  Medication Sig Dispense Refill   acetaminophen (TYLENOL) 325 MG tablet Take 650 mg by mouth every 4 (four) hours as needed for mild pain or moderate pain.      tamsulosin (FLOMAX) 0.4 MG CAPS capsule Take 0.4 mg by mouth.     No current facility-administered medications on file prior to visit.   Past Medical History:  Diagnosis Date  Arthritis    BENIGN PROSTATIC HYPERTROPHY 02/13/2008   COLONIC POLYPS 01/04/2008   Diverticulosis of colon (without mention of hemorrhage) 01/04/2008   Frequent headaches    GERD 11/17/2010   Hyperlipidemia    Internal hemorrhoids with other complication 2/69/4854    PROSTATITIS, RECURRENT 01/04/2008   Pulmonary embolism ([place])    PVD 08/17/2010   TOBACCO USE, QUIT 08/14/2009   Past Surgical History:  Procedure Laterality Date   Right wrist fracture      Allergies  Allergen Reactions   Terazosin     Family History  Problem Relation Age of Onset   CAD Mother    CAD Sister     Social History   Socioeconomic History   Marital status: Single    Spouse name: Not on file   Number of children: 3   Years of education: Not on file   Highest education level: Not on file  Occupational History   Not on file  Tobacco Use   Smoking status: Former    Types: Cigarettes    Quit date: 1958    Years since quitting: 65.8   Smokeless tobacco: Never  Substance and Sexual Activity   Alcohol use: No   Drug use: No   Sexual activity: Not on file  Other Topics Concern   Not on file  Social History Narrative   Not on file   Social Determinants of Health   Financial Resource Strain: Low Risk  (07/02/2021)   Overall Financial Resource Strain (CARDIA)    Difficulty of Paying Living Expenses: Not hard at all  Food Insecurity: Not on file  Transportation Needs: No Transportation Needs (07/02/2021)   PRAPARE - Hydrologist (Medical): No    Lack of Transportation (Non-Medical): No  Physical Activity: Not on file  Stress: Not on file  Social Connections: Not on file   Vitals:   08/29/22 1408  BP: 120/60  Pulse: (!) 38  Resp: 16  Temp: 98.6 F (37 C)  SpO2: 99%  Body mass index is 20.57 kg/m. Wt Readings from Last 3 Encounters:  08/29/22 147 lb 8 oz (66.9 kg)  08/27/21 161 lb 6 oz (73.2 kg)  06/11/21 152 lb (68.9 kg)  Physical Exam Vitals and nursing note reviewed.  Constitutional:      General: He is not in acute distress.    Appearance: He is well-developed.  HENT:     Head: Normocephalic and atraumatic.     Ears:     Comments: Hearing aids in place.    Mouth/Throat:     Mouth: Mucous membranes are moist.      Dentition: Has dentures.     Pharynx: Oropharynx is clear.  Eyes:     Conjunctiva/sclera: Conjunctivae normal.  Cardiovascular:     Rate and Rhythm: Bradycardia present. Rhythm irregular.     Heart sounds: Murmur (SEM I/VI RUSB and LUSB) heard.     Comments: DP pulses palpable. Pulmonary:     Effort: Pulmonary effort is normal. No respiratory distress.     Breath sounds: Normal breath sounds.  Abdominal:     Palpations: Abdomen is soft. There is no hepatomegaly or mass.     Tenderness: There is no abdominal tenderness.  Musculoskeletal:     Right lower leg: No edema.     Left lower leg: No edema.  Lymphadenopathy:     Cervical: No cervical adenopathy.  Skin:    General: Skin is warm.     Findings:  No erythema or rash.  Neurological:     General: No focal deficit present.     Mental Status: He is alert and oriented to person, place, and time.     Cranial Nerves: No cranial nerve deficit.     Comments: Stable gait, not assisted.  Psychiatric:        Mood and Affect: Mood and affect normal.   ASSESSMENT AND PLAN:  Mr.Torben was seen today for annual exam.  Diagnoses and all orders for this visit: Orders Placed This Encounter  Procedures   Flu Vaccine QUAD High Dose(Fluad)   Basic metabolic panel   TSH   Lab Results  Component Value Date   TSH 1.67 08/29/2022   Lab Results  Component Value Date   CREATININE 1.16 08/29/2022   BUN 29 (H) 08/29/2022   NA 141 08/29/2022   K 4.2 08/29/2022   CL 105 08/29/2022   CO2 28 08/29/2022   Routine general medical examination at a health care facility We discussed the importance of regular physical activity and healthy diet for prevention of chronic illness and/or complications. Preventive guidelines reviewed. Vaccination up to date. Fall precautions discussed. Next CPE in a year.  Dyslipidemia Hx of PAD. He is not longer on Atorvastatin and he does not want to resume it.  Essential (primary) hypertension BP  adequately controlled. Continue non pharmacologic treatment. Monitor BP at home.  Bradyarrhythmia HR re-checked 44/min. Complete AV block, we discussed possible complications. He is not interested in pacemaker placement. Code status discussed: Full code. Instructed about warning signs.  Embolism, pulmonary with infarction [facility]) He is not longer on Eliquis.  Need for influenza vaccination -     Flu Vaccine QUAD High Dose(Fluad)  Return in 1 year (on 08/30/2023) for cpe.  Furqan Gosselin G. Martinique, MD  [facility]. [place] office.

## 2022-08-29 ENCOUNTER — Encounter: Payer: Self-pay | Admitting: Family Medicine

## 2022-08-29 ENCOUNTER — Ambulatory Visit (INDEPENDENT_AMBULATORY_CARE_PROVIDER_SITE_OTHER): Payer: Medicare Other | Admitting: Family Medicine

## 2022-08-29 VITALS — BP 120/60 | HR 38 | Temp 98.6°F | Resp 16 | Ht 71.0 in | Wt 147.5 lb

## 2022-08-29 DIAGNOSIS — Z0000 Encounter for general adult medical examination without abnormal findings: Secondary | ICD-10-CM | POA: Diagnosis not present

## 2022-08-29 DIAGNOSIS — I2699 Other pulmonary embolism without acute cor pulmonale: Secondary | ICD-10-CM | POA: Diagnosis not present

## 2022-08-29 DIAGNOSIS — Z23 Encounter for immunization: Secondary | ICD-10-CM

## 2022-08-29 DIAGNOSIS — I10 Essential (primary) hypertension: Secondary | ICD-10-CM

## 2022-08-29 DIAGNOSIS — I498 Other specified cardiac arrhythmias: Secondary | ICD-10-CM

## 2022-08-29 DIAGNOSIS — E785 Hyperlipidemia, unspecified: Secondary | ICD-10-CM

## 2022-08-29 LAB — BASIC METABOLIC PANEL
BUN: 29 mg/dL — ABNORMAL HIGH (ref 6–23)
CO2: 28 mEq/L (ref 19–32)
Calcium: 9.6 mg/dL (ref 8.4–10.5)
Chloride: 105 mEq/L (ref 96–112)
Creatinine, Ser: 1.16 mg/dL (ref 0.40–1.50)
GFR: 55.86 mL/min — ABNORMAL LOW (ref >60.00)
Glucose, Bld: 82 mg/dL (ref 70–99)
Potassium: 4.2 mEq/L (ref 3.5–5.1)
Sodium: 141 mEq/L (ref 135–145)

## 2022-08-29 LAB — TSH: TSH: 1.67 u[IU]/mL (ref 0.35–5.50)

## 2022-08-29 NOTE — Patient Instructions (Addendum)
A few things to remember from today's visit:   Routine general medical examination at a health care facility  Essential (primary) hypertension - Plan: Basic metabolic panel, TSH  Bradyarrhythmia - Plan: Basic metabolic panel, TSH  You heart rate low today, 38-40 beats per minute. If you have any palpitation or chest pain or dizziness you need to seek immediate medical attention. Please be sure you have a follow up appt with your cardiologist.  If you need refills for medications you take chronically, please call your pharmacy. Do not use My Chart to request refills or for acute issues that need immediate attention. If you send a my chart message, it may take a few days to be addressed, specially if I am not in the office.  Please be sure medication list is accurate. If a new problem present, please set up appointment sooner than planned today.  Preventive Care 5 Years and Older, Male Preventive care refers to lifestyle choices and visits with your health care provider that can promote health and wellness. Preventive care visits are also called wellness exams. What can I expect for my preventive care visit? Counseling During your preventive care visit, your health care provider may ask about your: Medical history, including: Past medical problems. Family medical history. History of falls. Current health, including: Emotional well-being. Home life and relationship well-being. Sexual activity. Memory and ability to understand (cognition). Lifestyle, including: Alcohol, nicotine or tobacco, and drug use. Access to firearms. Diet, exercise, and sleep habits. Work and work Statistician. Sunscreen use. Safety issues such as seatbelt and bike helmet use. Physical exam Your health care provider will check your: Height and weight. These may be used to calculate your BMI (body mass index). BMI is a measurement that tells if you are at a healthy weight. Waist circumference. This  measures the distance around your waistline. This measurement also tells if you are at a healthy weight and may help predict your risk of certain diseases, such as type 2 diabetes and high blood pressure. Heart rate and blood pressure. Body temperature. Skin for abnormal spots. What immunizations do I need?  Vaccines are usually given at various ages, according to a schedule. Your health care provider will recommend vaccines for you based on your age, medical history, and lifestyle or other factors, such as travel or where you work. What tests do I need? Screening Your health care provider may recommend screening tests for certain conditions. This may include: Lipid and cholesterol levels. Diabetes screening. This is done by checking your blood sugar (glucose) after you have not eaten for a while (fasting). Hepatitis C test. Hepatitis B test. HIV (human immunodeficiency virus) test. STI (sexually transmitted infection) testing, if you are at risk. Lung cancer screening. Colorectal cancer screening. Prostate cancer screening. Abdominal aortic aneurysm (AAA) screening. You may need this if you are a current or former smoker. Talk with your health care provider about your test results, treatment options, and if necessary, the need for more tests. Follow these instructions at home: Eating and drinking  Eat a diet that includes fresh fruits and vegetables, whole grains, lean protein, and low-fat dairy products. Limit your intake of foods with high amounts of sugar, saturated fats, and salt. Take vitamin and mineral supplements as recommended by your health care provider. Do not drink alcohol if your health care provider tells you not to drink. If you drink alcohol: Limit how much you have to 0-2 drinks a day. Know how much alcohol is in your drink.  In the [place] one drink equals one 12 oz bottle of beer (355 mL), one 5 oz glass of wine (148 mL), or one 1 oz glass of hard liquor (44  mL). Lifestyle Brush your teeth every morning and night with fluoride toothpaste. Floss one time each day. Exercise for at least 30 minutes 5 or more days each week. Do not use any products that contain nicotine or tobacco. These products include cigarettes, chewing tobacco, and vaping devices, such as e-cigarettes. If you need help quitting, ask your health care provider. Do not use drugs. If you are sexually active, practice safe sex. Use a condom or other form of protection to prevent STIs. Take aspirin only as told by your health care provider. Make sure that you understand how much to take and what form to take. Work with your health care provider to find out whether it is safe and beneficial for you to take aspirin daily. Ask your health care provider if you need to take a cholesterol-lowering medicine (statin). Find healthy ways to manage stress, such as: Meditation, yoga, or listening to music. Journaling. Talking to a trusted person. Spending time with friends and family. Safety Always wear your seat belt while driving or riding in a vehicle. Do not drive: If you have been drinking alcohol. Do not ride with someone who has been drinking. When you are tired or distracted. While texting. If you have been using any mind-altering substances or drugs. Wear a helmet and other protective equipment during sports activities. If you have firearms in your house, make sure you follow all gun safety procedures. Minimize exposure to UV radiation to reduce your risk of skin cancer. What's next? Visit your health care provider once a year for an annual wellness visit. Ask your health care provider how often you should have your eyes and teeth checked. Stay up to date on all vaccines. This information is not intended to replace advice given to you by your health care provider. Make sure you discuss any questions you have with your health care provider. Document Revised: 04/21/2021 Document  Reviewed: 04/21/2021 Elsevier Patient Education  [place].

## 2022-09-13 ENCOUNTER — Telehealth: Payer: Self-pay | Admitting: Family Medicine

## 2022-09-13 NOTE — Telephone Encounter (Signed)
Left message for patient to call back and schedule Medicare Annual Wellness Visit (AWV) either virtually or in office. Left  my Herbie Drape number [phone]   Last AWV  08/27/21 please schedule with Nurse Health Adviser   45 min for awv-i and in office appointments 30 min for awv-s  phone/virtual appointments

## 2022-11-16 ENCOUNTER — Inpatient Hospital Stay (HOSPITAL_COMMUNITY)
Admission: EM | Admit: 2022-11-16 | Discharge: 2022-11-18 | DRG: 243 | Disposition: A | Discharge status: Home / Self Care | Payer: Medicare Other | Source: Ambulatory Visit | Attending: Cardiovascular Disease | Admitting: Cardiovascular Disease

## 2022-11-16 ENCOUNTER — Emergency Department (HOSPITAL_COMMUNITY): Payer: Medicare Other

## 2022-11-16 ENCOUNTER — Other Ambulatory Visit: Payer: Self-pay

## 2022-11-16 DIAGNOSIS — I442 Atrioventricular block, complete: Secondary | ICD-10-CM | POA: Diagnosis not present

## 2022-11-16 DIAGNOSIS — Z8719 Personal history of other diseases of the digestive system: Secondary | ICD-10-CM

## 2022-11-16 DIAGNOSIS — N183 Chronic kidney disease, stage 3 unspecified: Secondary | ICD-10-CM | POA: Diagnosis present

## 2022-11-16 DIAGNOSIS — I129 Hypertensive chronic kidney disease with stage 1 through stage 4 chronic kidney disease, or unspecified chronic kidney disease: Secondary | ICD-10-CM | POA: Diagnosis present

## 2022-11-16 DIAGNOSIS — Z87891 Personal history of nicotine dependence: Secondary | ICD-10-CM

## 2022-11-16 DIAGNOSIS — Z888 Allergy status to other drugs, medicaments and biological substances status: Secondary | ICD-10-CM

## 2022-11-16 DIAGNOSIS — Z86711 Personal history of pulmonary embolism: Secondary | ICD-10-CM

## 2022-11-16 DIAGNOSIS — N400 Enlarged prostate without lower urinary tract symptoms: Secondary | ICD-10-CM | POA: Diagnosis present

## 2022-11-16 DIAGNOSIS — R001 Bradycardia, unspecified: Secondary | ICD-10-CM

## 2022-11-16 DIAGNOSIS — Z8249 Family history of ischemic heart disease and other diseases of the circulatory system: Secondary | ICD-10-CM

## 2022-11-16 DIAGNOSIS — I495 Sick sinus syndrome: Secondary | ICD-10-CM | POA: Diagnosis present

## 2022-11-16 DIAGNOSIS — R55 Syncope and collapse: Principal | ICD-10-CM

## 2022-11-16 DIAGNOSIS — N179 Acute kidney failure, unspecified: Secondary | ICD-10-CM | POA: Diagnosis present

## 2022-11-16 DIAGNOSIS — Z79899 Other long term (current) drug therapy: Secondary | ICD-10-CM

## 2022-11-16 DIAGNOSIS — Z91128 Patient's intentional underdosing of medication regimen for other reason: Secondary | ICD-10-CM

## 2022-11-16 DIAGNOSIS — I739 Peripheral vascular disease, unspecified: Secondary | ICD-10-CM | POA: Diagnosis present

## 2022-11-16 DIAGNOSIS — R7989 Other specified abnormal findings of blood chemistry: Secondary | ICD-10-CM | POA: Diagnosis present

## 2022-11-16 DIAGNOSIS — T465X6A Underdosing of other antihypertensive drugs, initial encounter: Secondary | ICD-10-CM | POA: Diagnosis present

## 2022-11-16 DIAGNOSIS — M1990 Unspecified osteoarthritis, unspecified site: Secondary | ICD-10-CM | POA: Diagnosis present

## 2022-11-16 DIAGNOSIS — E785 Hyperlipidemia, unspecified: Secondary | ICD-10-CM | POA: Diagnosis present

## 2022-11-16 DIAGNOSIS — Z7901 Long term (current) use of anticoagulants: Secondary | ICD-10-CM

## 2022-11-16 LAB — COMPREHENSIVE METABOLIC PANEL
ALT: 24 U/L (ref 0–44)
AST: 28 U/L (ref 15–41)
Albumin: 3.1 g/dL — ABNORMAL LOW (ref 3.5–5.0)
Alkaline Phosphatase: 81 U/L (ref 38–126)
Anion gap: 9 (ref 5–15)
BUN: 25 mg/dL — ABNORMAL HIGH (ref 8–23)
CO2: 24 mmol/L (ref 22–32)
Calcium: 8.8 mg/dL — ABNORMAL LOW (ref 8.9–10.3)
Chloride: 104 mmol/L (ref 98–111)
Creatinine, Ser: 1.6 mg/dL — ABNORMAL HIGH (ref 0.61–1.24)
GFR, Estimated: 41 mL/min — ABNORMAL LOW (ref >60)
Glucose, Bld: 182 mg/dL — ABNORMAL HIGH (ref 70–99)
Potassium: 4.4 mmol/L (ref 3.5–5.1)
Sodium: 137 mmol/L (ref 135–145)
Total Bilirubin: 0.7 mg/dL (ref 0.3–1.2)
Total Protein: 6.6 g/dL (ref 6.5–8.1)

## 2022-11-16 LAB — CBC WITH DIFFERENTIAL/PLATELET
Abs Immature Granulocytes: 0.01 10*3/uL (ref 0.00–0.07)
Basophils Absolute: 0 10*3/uL (ref 0.0–0.1)
Basophils Relative: 0 %
Eosinophils Absolute: 0.1 10*3/uL (ref 0.0–0.5)
Eosinophils Relative: 3 %
HCT: 38.3 % — ABNORMAL LOW (ref 39.0–52.0)
Hemoglobin: 12.8 g/dL — ABNORMAL LOW (ref 13.0–17.0)
Immature Granulocytes: 0 %
Lymphocytes Relative: 22 %
Lymphs Abs: 1 10*3/uL (ref 0.7–4.0)
MCH: 30.1 pg (ref 26.0–34.0)
MCHC: 33.4 g/dL (ref 30.0–36.0)
MCV: 90.1 fL (ref 80.0–100.0)
Monocytes Absolute: 0.4 10*3/uL (ref 0.1–1.0)
Monocytes Relative: 10 %
Neutro Abs: 2.9 10*3/uL (ref 1.7–7.7)
Neutrophils Relative %: 65 %
Platelets: 184 10*3/uL (ref 150–400)
RBC: 4.25 MIL/uL (ref 4.22–5.81)
RDW: 14 % (ref 11.5–15.5)
WBC: 4.5 10*3/uL (ref 4.0–10.5)
nRBC: 0 % (ref 0.0–0.2)

## 2022-11-16 LAB — TROPONIN I (HIGH SENSITIVITY)
Troponin I (High Sensitivity): 26 ng/L — ABNORMAL HIGH (ref <18)
Troponin I (High Sensitivity): 32 ng/L — ABNORMAL HIGH (ref <18)

## 2022-11-16 LAB — BRAIN NATRIURETIC PEPTIDE: B Natriuretic Peptide: 574.5 pg/mL — ABNORMAL HIGH (ref 0.0–100.0)

## 2022-11-16 MED ORDER — ACETAMINOPHEN 325 MG PO TABS
650.0000 mg | ORAL_TABLET | ORAL | Status: DC | PRN
  Administered 2022-11-17: 650 mg via ORAL
  Filled 2022-11-16: qty 2

## 2022-11-16 MED ORDER — NITROGLYCERIN 0.4 MG SL SUBL: 0.4000 mg | SUBLINGUAL_TABLET | SUBLINGUAL | Status: DC | PRN

## 2022-11-16 MED ORDER — LOSARTAN POTASSIUM 50 MG PO TABS
25.0000 mg | ORAL_TABLET | Freq: Every day | ORAL | Status: DC
  Administered 2022-11-16: 25 mg via ORAL
  Filled 2022-11-16: qty 1

## 2022-11-16 MED ORDER — ONDANSETRON HCL 4 MG/2ML IJ SOLN: 4.0000 mg | Freq: Four times a day (QID) | INTRAMUSCULAR | Status: DC | PRN

## 2022-11-16 NOTE — ED Notes (Signed)
Mealor, MD made aware of current HR. MD states as long as patient is not hypotensive or having pauses lasting longer than 15 seconds then he is okay.

## 2022-11-16 NOTE — ED Triage Notes (Signed)
Pt c/o dizziness that began this am; sent by pcp for further evaluation; pt endorses hx bradycardia, rate currently 30s; denies cp, sob

## 2022-11-16 NOTE — ED Provider Notes (Incomplete)
[facility] EMERGENCY DEPARTMENT Provider Note   CSN: [id number] Arrival date & time: 11/16/22  1335     History {Add pertinent medical, surgical, social history, OB history to HPI:1} No chief complaint on file.   Connor Neal is a 89 y.o. male.  HPI       89yo male with history of PE (no longer on eliquis), BPH, hyperlipidemia, PVD, history of complete AV block had declined pacemaker placement in the past     [facility] "Connor Neal has sinus bradycardia at baseline and episodic complete heart block with up to 6.8 second ventricular pauses. One of two pause episodes on Holter monitor occured in the middle of the day and the patient reports that he was likely awake at that time. He has episodic unexplained syncope which suggests symptomatic episodes of heart block which is an indication for pacemaker implant. I recommended a left sided dual chamber pacemaker. He understands this recommendation, but indicated that he will talk it over one more time with his daughter (who was also present at the visit) before committing to the procedure.  1. Intermittent complete heart block, symptomatic 2. Sinus node dysfunction - Schedule left sided dual chamber pacemaker implant (copying Kim) - Recommend receiving pacemaker before hernia surgery as he may develop worsened bradycardia or asystole with anesthesia  EDUCATION: The above plan was discussed with the patient, who voiced understanding and agreement. All questions were answered.  The Attending of record for this patient is Dr. Nancy Fetter. This case has been discussed and the Attending agrees with the recommended plan.  Teresa Pelton, MD Cardiac Electrophysiology Fellow "   Past Medical History:  Diagnosis Date   Arthritis    BENIGN PROSTATIC HYPERTROPHY 02/13/2008   COLONIC POLYPS 01/04/2008   Diverticulosis of colon (without mention of hemorrhage) 01/04/2008   Frequent headaches    GERD 11/17/2010    Hyperlipidemia    Internal hemorrhoids with other complication 6/96/7893   PROSTATITIS, RECURRENT 01/04/2008   Pulmonary embolism ([place])    PVD 08/17/2010   TOBACCO USE, QUIT 08/14/2009    Home Medications Prior to Admission medications   Medication Sig Start Date End Date Taking? Authorizing Provider  acetaminophen (TYLENOL) 325 MG tablet Take 650 mg by mouth every 4 (four) hours as needed for mild pain or moderate pain.     [provider]  tamsulosin (FLOMAX) 0.4 MG CAPS capsule Take 0.4 mg by mouth.    [provider]      Allergies    Terazosin    Review of Systems   Review of Systems  Physical Exam Updated Vital Signs BP (!) 124/95   Pulse (!) 53   Temp (!) 97.4 F (36.3 C)   Resp (!) 22   SpO2 99%  Physical Exam  ED Results / Procedures / Treatments   Labs (all labs ordered are listed, but only abnormal results are displayed) Labs Reviewed  CBC WITH DIFFERENTIAL/PLATELET  COMPREHENSIVE METABOLIC PANEL  BRAIN NATRIURETIC PEPTIDE  I-STAT CHEM 8, ED  TROPONIN I (HIGH SENSITIVITY)    EKG None  Radiology No results found.  Procedures Procedures  {Document cardiac monitor, telemetry assessment procedure when appropriate:1}  Medications Ordered in ED Medications - No data to display  ED Course/ Medical Decision Making/ A&P                           Medical Decision Making Amount and/or Complexity of Data Reviewed Labs:  ordered. Radiology: ordered.   ***  {Document critical care time when appropriate:1} {Document review of labs and clinical decision tools ie heart score, Chads2Vasc2 etc:1}  {Document your independent review of radiology images, and any outside records:1} {Document your discussion with family members, caretakers, and with consultants:1} {Document social determinants of health affecting pt's care:1} {Document your decision making why or why not admission, treatments were needed:1} Final Clinical Impression(s) / ED  Diagnoses Final diagnoses:  None    Rx / DC Orders ED Discharge Orders     None

## 2022-11-16 NOTE — H&P (Signed)
Cardiology Admission History and Physical   Patient ID: Connor Neal MRN: [id number]; DOB: 09/15/1931   Admission date: 11/16/2022  PCP:  [place], Betty G, [place] Providers Cardiologist:  None        Chief Complaint:  dizzy spell  Patient Profile:   Connor Neal is a 89 y.o. male with HTN, HLD, PE (on Eliquis), and known conduction system disease who is being seen 11/16/2022 for the evaluation of heart block.  History of Present Illness:   Connor Neal was referred to EP at the [place] on 07/26/22 their note reads: "As a part of pre-operative evaluation for hernia surgery, echocardiogram was  ordered. Echo showed normal LVEF and no major structural abnormalities. While in  the echo holding area, his heart rate was noted to be in the 20s. Follow up  Holter monitor showed heart rate ranging from 21-207 bpm (average 57), VT runs  up to 21 beats, and episodic high grade AV block lasting up to 6.8 seconds at 2  am and 5.5 seconds at midday.   He reports that he remains active going for walks and working in his garden. He  has had no overt syncope or falls. He has had episodes of severe lightheadedness  / presyncope. One episode was linked to working outside in the hot sun, but he  has had others not provoked by positional changes or other identifiable  triggers."  He was recommended to undergo PPM implant for CHB and SB dysfunction, he was seen with his daughter at that time, he wanted to think about it.  At his PMD visit in October his charted HR was 38.  He comes today reporting a strong dizzy spell, worse then usual, no syncope. No CP, palpitations No SOB He found his BP high, reached out to someone who recommended he go to the ER.  He does tell us independently that he has seen a specialist in the past and recommended that he get a PPM, though still had  not made up is mind.  Labs are drawn, pending   Past Medical History:  Diagnosis  Date   Arthritis    BENIGN PROSTATIC HYPERTROPHY 02/13/2008   COLONIC POLYPS 01/04/2008   Diverticulosis of colon (without mention of hemorrhage) 01/04/2008   Frequent headaches    GERD 11/17/2010   Hyperlipidemia    Internal hemorrhoids with other complication 1/44/8185   PROSTATITIS, RECURRENT 01/04/2008   Pulmonary embolism ([place])    PVD 08/17/2010   TOBACCO USE, QUIT 08/14/2009    Past Surgical History:  Procedure Laterality Date   Right wrist fracture       Medications Prior to Admission: Prior to Admission medications   Medication Sig Start Date End Date Taking? Authorizing Provider  acetaminophen (TYLENOL) 325 MG tablet Take 650 mg by mouth every 4 (four) hours as needed for mild pain or moderate pain.     [provider]  tamsulosin (FLOMAX) 0.4 MG CAPS capsule Take 0.4 mg by mouth.    [provider]     Allergies:    Allergies  Allergen Reactions   Terazosin     Social History:   Social History   Socioeconomic History   Marital status: Single    Spouse name: Not on file   Number of children: 3   Years of education: Not on file   Highest education level: Not on file  Occupational History   Not on file  Tobacco Use  Smoking status: Former    Types: Cigarettes    Quit date: 1958    Years since quitting: 66.0   Smokeless tobacco: Never  Substance and Sexual Activity   Alcohol use: No   Drug use: No   Sexual activity: Not on file  Other Topics Concern   Not on file  Social History Narrative   Not on file   Social Determinants of Health   Financial Resource Strain: Low Risk  (07/02/2021)   Overall Financial Resource Strain (CARDIA)    Difficulty of Paying Living Expenses: Not hard at all  Food Insecurity: Not on file  Transportation Needs: No Transportation Needs (07/02/2021)   PRAPARE - Hydrologist (Medical): No    Lack of Transportation (Non-Medical): No  Physical Activity: Not on file  Stress: Not on  file  Social Connections: Not on file  Intimate Partner Violence: Not on file    Family History:   The patient's family history includes CAD in his mother and sister.    ROS:  Please see the history of present illness.  All other ROS reviewed and negative.     Physical Exam/Data:   Vitals:   11/16/22 1502 11/16/22 1526 11/16/22 1600  BP: (!) 196/62 (!) 124/95 (!) 215/89  Pulse: (!) 38 (!) 53 (!) 56  Resp: 16 (!) 22 19  Temp: (!) 97.4 F (36.3 C)    SpO2: 100% 99% 99%   No intake or output data in the 24 hours ending 11/16/22 1632    08/29/2022    2:08 PM 08/27/2021   11:46 AM 06/11/2021    1:55 PM  Last 3 Weights  Weight (lbs) 147 lb 8 oz 161 lb 6 oz 152 lb  Weight (kg) 66.906 kg 73.199 kg 68.947 kg     There is no height or weight on file to calculate BMI.  General:  Well nourished, well developed, in no acute distress HEENT: normal Neck: no JVD Vascular: No carotid bruits; Distal pulses 2+ bilaterally   Cardiac: RRR; no murmurs, gallops or rubs Lungs:  CTA b/l, no wheezing, rhonchi or rales  Abd: soft, nontender Ext: no edema Musculoskeletal:  No deformities Skin: warm and dry  Neuro:  CNs 2-12 intact, no focal abnormalities noted Psych:  Normal affect    EKG:  The ECG that was done today was personally reviewed   Baseline artifact, evidence of Mobitz one AV block and 2:1 as well, V rate 39 Atach w/CHB > SR 1st degree AVblock, QRS 27m  TELE: SR, long 1st degree Avblock, blocked PACs, intermittently has BBB morphology, a lot of artifact at times makes underlying rhythm difficult, mostly HRs 40's-50's  Relevant CV Studies:  Noted in the EP consulted dated 07/26/22 ECG 04/21/2022: sinis bradycardia with Mobitz 1 second degree AV block. QRS  duration is 80 ms and QTc 360 ms.   Transthoracic echocardiogram This was essentially a normal study. Left ventricular systolic function is  normal. Ejection Fraction = >55%. Proximal septal thickening is noted. Right  ventricular systolic pressure is normal. Bradycardia noted throughout with occasional HR to 37  Ambulatory monitor (14-day) July 2023 1. Indications for study: Bradycardia, unspecified 2. Background rhythm: Sinus rhythm  3. Noteworthy arrhythmias a) Ventricular tachycardia runs: Fastest heart rate four-beat run 176 - 207 bpm  12:32 hrs. 05/06/2022 VT strip #1 b) Ventricular tachycardia runs: Longest episode 21-beat run 77 - 146 bpm 12:04  hrs. 04/23/2022 VT strip #3 c) VT burden: Nine episodes;  heart rate range 51 - 207 bpm  d) Longest pause: 6.8 seconds high-grade A-V block 02:04 hrs. 04/23/2022 Pause  strip #1 e) Second longest pause: 5.5 seconds high-grade A-V block 12:27 hrs. 04/29/2022  Pause strip #2 f) Pause burden: [3.0 sec. or longer] 261 episodes duration range 3.0 - 6.8  seconds  g) A-V block: Third degree long R-R interval 2.24 seconds 02:22 hrs. 04/28/2022  AVB strip #1 h) A-V block burden: Four episodes Ventricular ectopy consisted of isolated PVC (4,716; <1%) bigeminy () and  couplets () 4. Annotated events: none   Laboratory Data:  High Sensitivity Troponin:  No results for input(s): "TROPONINIHS" in the last 720 hours.    ChemistryNo results for input(s): "NA", "K", "CL", "CO2", "GLUCOSE", "BUN", "CREATININE", "CALCIUM", "MG", "GFRNONAA", "GFRAA", "ANIONGAP" in the last 168 hours.  No results for input(s): "PROT", "ALBUMIN", "AST", "ALT", "ALKPHOS", "BILITOT" in the last 168 hours. Lipids No results for input(s): "CHOL", "TRIG", "HDL", "LABVLDL", "LDLCALC", "CHOLHDL" in the last 168 hours. HematologyNo results for input(s): "WBC", "RBC", "HGB", "HCT", "MCV", "MCH", "MCHC", "RDW", "PLT" in the last 168 hours. Thyroid No results for input(s): "TSH", "FREET4" in the last 168 hours. BNPNo results for input(s): "BNP", "PROBNP" in the last 168 hours.  DDimer No results for input(s): "DDIMER" in the last 168 hours.   Radiology/Studies:  DG Chest Portable 1 View Result  Date: 11/16/2022 CLINICAL DATA:  Bradycardia.  Dizziness. EXAM: PORTABLE CHEST 1 VIEW COMPARISON:  Radiographs 12/11/2019 and 01/04/2017.  CT 03/18/2005. FINDINGS: The heart size and mediastinal contours are stable with aortic and brachiocephalic tortuosity. There are lower lung volumes with resulting mild bibasilar atelectasis. Mild biapical scarring appears unchanged. No confluent airspace opacity, pleural effusion or pneumothorax. The bones appear stable. Telemetry leads overlie the chest. IMPRESSION: Lower lung volumes with resulting mild bibasilar atelectasis. No acute cardiopulmonary process. Electronically Signed   By: Richardean Sale M.D.   On: 11/16/2022 15:51     Assessment and Plan:   CHB Intermittent BBB morphology on telemetry Known history of the same Previously declined PPM (saw EP in the [place]) No nodal blocking agents  He is still not quite on board with PPM but willing to come in for observation and will revisit possible PPM tomorrow  2. Hx of PE On eliquis chronically Hold for PPM SCDs  3. HTN Home losartan  Risk Assessment/Risk Scores:    For questions or updates, please contact [place] Please consult [email or web address] for contact info under     Signed, Baldwin [place], PA-C  11/16/2022 4:32 PM

## 2022-11-17 ENCOUNTER — Other Ambulatory Visit: Payer: Self-pay

## 2022-11-17 ENCOUNTER — Encounter: Payer: Self-pay | Admitting: Family Medicine

## 2022-11-17 ENCOUNTER — Encounter (HOSPITAL_COMMUNITY)
Admission: EM | Disposition: A | Discharge status: Home / Self Care | Payer: Self-pay | Source: Ambulatory Visit | Attending: Cardiovascular Disease

## 2022-11-17 DIAGNOSIS — M1990 Unspecified osteoarthritis, unspecified site: Secondary | ICD-10-CM | POA: Diagnosis present

## 2022-11-17 DIAGNOSIS — N183 Chronic kidney disease, stage 3 unspecified: Secondary | ICD-10-CM | POA: Diagnosis present

## 2022-11-17 DIAGNOSIS — R7989 Other specified abnormal findings of blood chemistry: Secondary | ICD-10-CM | POA: Diagnosis present

## 2022-11-17 DIAGNOSIS — I495 Sick sinus syndrome: Secondary | ICD-10-CM | POA: Diagnosis present

## 2022-11-17 DIAGNOSIS — I129 Hypertensive chronic kidney disease with stage 1 through stage 4 chronic kidney disease, or unspecified chronic kidney disease: Secondary | ICD-10-CM | POA: Diagnosis present

## 2022-11-17 DIAGNOSIS — Z91128 Patient's intentional underdosing of medication regimen for other reason: Secondary | ICD-10-CM | POA: Diagnosis not present

## 2022-11-17 DIAGNOSIS — T465X6A Underdosing of other antihypertensive drugs, initial encounter: Secondary | ICD-10-CM | POA: Diagnosis present

## 2022-11-17 DIAGNOSIS — Z86711 Personal history of pulmonary embolism: Secondary | ICD-10-CM | POA: Diagnosis not present

## 2022-11-17 DIAGNOSIS — N179 Acute kidney failure, unspecified: Secondary | ICD-10-CM | POA: Diagnosis present

## 2022-11-17 DIAGNOSIS — I739 Peripheral vascular disease, unspecified: Secondary | ICD-10-CM | POA: Diagnosis present

## 2022-11-17 DIAGNOSIS — I441 Atrioventricular block, second degree: Secondary | ICD-10-CM

## 2022-11-17 DIAGNOSIS — R55 Syncope and collapse: Secondary | ICD-10-CM | POA: Diagnosis present

## 2022-11-17 DIAGNOSIS — E785 Hyperlipidemia, unspecified: Secondary | ICD-10-CM | POA: Diagnosis present

## 2022-11-17 DIAGNOSIS — N400 Enlarged prostate without lower urinary tract symptoms: Secondary | ICD-10-CM | POA: Diagnosis present

## 2022-11-17 DIAGNOSIS — Z87891 Personal history of nicotine dependence: Secondary | ICD-10-CM | POA: Diagnosis not present

## 2022-11-17 DIAGNOSIS — Z8719 Personal history of other diseases of the digestive system: Secondary | ICD-10-CM | POA: Diagnosis not present

## 2022-11-17 DIAGNOSIS — Z8249 Family history of ischemic heart disease and other diseases of the circulatory system: Secondary | ICD-10-CM | POA: Diagnosis not present

## 2022-11-17 DIAGNOSIS — I442 Atrioventricular block, complete: Secondary | ICD-10-CM | POA: Diagnosis present

## 2022-11-17 DIAGNOSIS — Z79899 Other long term (current) drug therapy: Secondary | ICD-10-CM | POA: Diagnosis not present

## 2022-11-17 DIAGNOSIS — Z7901 Long term (current) use of anticoagulants: Secondary | ICD-10-CM | POA: Diagnosis not present

## 2022-11-17 DIAGNOSIS — Z888 Allergy status to other drugs, medicaments and biological substances status: Secondary | ICD-10-CM | POA: Diagnosis not present

## 2022-11-17 HISTORY — PX: PACEMAKER IMPLANT: EP1218

## 2022-11-17 LAB — BASIC METABOLIC PANEL
Anion gap: 8 (ref 5–15)
BUN: 24 mg/dL — ABNORMAL HIGH (ref 8–23)
CO2: 25 mmol/L (ref 22–32)
Calcium: 9.2 mg/dL (ref 8.9–10.3)
Chloride: 104 mmol/L (ref 98–111)
Creatinine, Ser: 1.39 mg/dL — ABNORMAL HIGH (ref 0.61–1.24)
GFR, Estimated: 48 mL/min — ABNORMAL LOW (ref >60)
Glucose, Bld: 84 mg/dL (ref 70–99)
Potassium: 4.3 mmol/L (ref 3.5–5.1)
Sodium: 137 mmol/L (ref 135–145)

## 2022-11-17 SURGERY — PACEMAKER IMPLANT

## 2022-11-17 MED ORDER — SODIUM CHLORIDE 0.9 % IV SOLN
INTRAVENOUS | Status: AC
  Filled 2022-11-17: qty 2

## 2022-11-17 MED ORDER — SODIUM CHLORIDE 0.9 % IV SOLN: INTRAVENOUS | Status: DC

## 2022-11-17 MED ORDER — HYDRALAZINE HCL 20 MG/ML IJ SOLN
10.0000 mg | Freq: Three times a day (TID) | INTRAMUSCULAR | Status: DC | PRN
  Administered 2022-11-17: 10 mg via INTRAVENOUS
  Filled 2022-11-17: qty 1

## 2022-11-17 MED ORDER — ORAL CARE MOUTH RINSE: 15.0000 mL | OROMUCOSAL | Status: DC | PRN

## 2022-11-17 MED ORDER — HEPARIN (PORCINE) IN NACL 1000-0.9 UT/500ML-% IV SOLN
INTRAVENOUS | Status: DC | PRN
  Administered 2022-11-17: 500 mL

## 2022-11-17 MED ORDER — FENTANYL CITRATE (PF) 100 MCG/2ML IJ SOLN
INTRAMUSCULAR | Status: AC
  Filled 2022-11-17: qty 2

## 2022-11-17 MED ORDER — HEPARIN (PORCINE) IN NACL 1000-0.9 UT/500ML-% IV SOLN
INTRAVENOUS | Status: AC
  Filled 2022-11-17: qty 500

## 2022-11-17 MED ORDER — CHLORHEXIDINE GLUCONATE 4 % EX LIQD: 60.0000 mL | Freq: Once | CUTANEOUS | Status: AC

## 2022-11-17 MED ORDER — LIDOCAINE HCL (PF) 1 % IJ SOLN
INTRAMUSCULAR | Status: AC
  Filled 2022-11-17: qty 60

## 2022-11-17 MED ORDER — SODIUM CHLORIDE 0.9 % IV SOLN: 250.0000 mL | INTRAVENOUS | Status: DC

## 2022-11-17 MED ORDER — CHLORHEXIDINE GLUCONATE 4 % EX LIQD: 60.0000 mL | Freq: Once | CUTANEOUS | Status: DC

## 2022-11-17 MED ORDER — SODIUM CHLORIDE 0.9% FLUSH: 3.0000 mL | INTRAVENOUS | Status: DC | PRN

## 2022-11-17 MED ORDER — LIDOCAINE HCL (PF) 1 % IJ SOLN
INTRAMUSCULAR | Status: DC | PRN
  Administered 2022-11-17: 60 mL

## 2022-11-17 MED ORDER — CEFAZOLIN SODIUM-DEXTROSE 2-4 GM/100ML-% IV SOLN
INTRAVENOUS | Status: AC
  Filled 2022-11-17: qty 100

## 2022-11-17 MED ORDER — SODIUM CHLORIDE 0.9% FLUSH
3.0000 mL | Freq: Two times a day (BID) | INTRAVENOUS | Status: DC
  Administered 2022-11-17 (×2): 3 mL via INTRAVENOUS

## 2022-11-17 MED ORDER — SODIUM CHLORIDE 0.9 % IV SOLN
80.0000 mg | INTRAVENOUS | Status: AC
  Administered 2022-11-17: 80 mg
  Filled 2022-11-17: qty 2

## 2022-11-17 MED ORDER — HYDRALAZINE HCL 25 MG PO TABS: 25.0000 mg | ORAL_TABLET | Freq: Three times a day (TID) | ORAL | Status: DC | PRN

## 2022-11-17 MED ORDER — CEFAZOLIN SODIUM-DEXTROSE 1-4 GM/50ML-% IV SOLN
1.0000 g | Freq: Four times a day (QID) | INTRAVENOUS | Status: AC
  Administered 2022-11-17 – 2022-11-18 (×3): 1 g via INTRAVENOUS
  Filled 2022-11-17 (×3): qty 50

## 2022-11-17 MED ORDER — SENNOSIDES-DOCUSATE SODIUM 8.6-50 MG PO TABS
1.0000 | ORAL_TABLET | Freq: Every evening | ORAL | Status: DC | PRN
  Administered 2022-11-17: 1 via ORAL
  Filled 2022-11-17: qty 1

## 2022-11-17 MED ORDER — CEFAZOLIN SODIUM-DEXTROSE 2-4 GM/100ML-% IV SOLN
2.0000 g | INTRAVENOUS | Status: AC
  Administered 2022-11-17: 2 g via INTRAVENOUS

## 2022-11-17 MED ORDER — HYDRALAZINE HCL 25 MG PO TABS: 25.0000 mg | ORAL_TABLET | Freq: Four times a day (QID) | ORAL | Status: DC | PRN

## 2022-11-17 MED ORDER — FENTANYL CITRATE (PF) 100 MCG/2ML IJ SOLN
INTRAMUSCULAR | Status: DC | PRN
  Administered 2022-11-17: 25 ug via INTRAVENOUS

## 2022-11-17 SURGICAL SUPPLY — 14 items
CABLE SURGICAL S-101-97-12 (CABLE) ×1 IMPLANT
CATH CPS LOCATOR 3D MED (CATHETERS) IMPLANT
HELIX LOCKING TOOL (MISCELLANEOUS) ×1
LEAD ULTIPACE 52 LPA1231/52 (Lead) IMPLANT
LEAD ULTIPACE 65 LPA1231/65 (Lead) IMPLANT
PACEMAKER ASSURITY DR-RF (Pacemaker) IMPLANT
PAD DEFIB RADIO PHYSIO CONN (PAD) ×1 IMPLANT
SHEATH 7FR PRELUDE SNAP 13 (SHEATH) IMPLANT
SHEATH 9FR PRELUDE SNAP 13 (SHEATH) IMPLANT
SHEATH PROBE COVER 6X72 (BAG) IMPLANT
SLITTER AGILIS HISPRO (INSTRUMENTS) IMPLANT
TOOL HELIX LOCKING (MISCELLANEOUS) IMPLANT
TRAY PACEMAKER INSERTION (PACKS) ×1 IMPLANT
WIRE HI TORQ VERSACORE-J 145CM (WIRE) IMPLANT

## 2022-11-17 NOTE — ED Notes (Addendum)
Writer paged on-call provider to inquire about current BP and whether treatment with medication is necessary, awaiting response. Pt asymptomatic at this time, denies dizziness, SOB. Reports mild headache that he's had off and on for several hours.

## 2022-11-17 NOTE — Discharge Summary (Addendum)
DISCHARGE SUMMARY    Patient ID: Connor Neal,  MRN: [id number], DOB/AGE: 11-Jan-1931 89 y.o.  Admit date: 11/16/2022 Discharge date: 11/18/21  Primary Care Physician: [place], Betty G, MD  Electrophysiologist: new, Dr. Quentin Neal  Primary Discharge Diagnosis:  CHB Conduction system disease Near syncope  Secondary Discharge Diagnosis:  HTN Uncontrolled, poor medication compliance Historically had been on losartan, though self stopped HLD PE Historically (apparently in 2016 and described as unprovoked), self stopped his [place], well known to his out patient primary providers.    Allergies  Allergen Reactions   Terazosin      Procedures This Admission:  1.  Implantation of a Abbottdual chamber PPM on 11/17/22 by Dr Connor Neal.    There were no immediate post procedure complications. CXR on 11/18/22 demonstrated no pneumothorax status post device implantation.   Brief HPI: Connor Neal is a 89 y.o. male with above PMHx, known conduction system disease including CHB that had previously been recommended PPM though declined, came with c/o a near syncopal event, the worst dizzy spell he has "ever had".  Admitting that intermittently he does have lightheaded spells, none had been as severe as that morning. He was observed to have various degrees of heart block on his EKGs and telemetry EP called to evaluate  Hospital Course:  No reversible causes were found and recommended that he have PPM implanted.  He was initially reluctant to consider PPM though was agreeable to stay for observation.  Labs once back noted some AKI/CKD, that did resolve.  With his family's support, and a daughter at bedside, and further discussion he did ultimately become agreeable to have PPM implant done.  He was monitored on telemetry overnight which demonstrated SR/V pacing and Av pacing.  Left chest was without hematoma or ecchymosis.  The device was interrogated and found to be functioning  normally.  CXR was obtained and demonstrated no pneumothorax status post device implantation.  Wound care, arm mobility, and restrictions were reviewed with the patient and his son at bedside  The patient feels well, denies any CP/SOB, with minimal site discomfort.  He was examined by Dr. Curt Neal and considered stable for discharge to home.   His BP without PRN meds has normalized Connor Neal defer ongoing monitoring.management to his PMD   Discussed he needs to re-establish with the EP team at the [facility], once that happens we can transfer his PPM monitring/management to them.  No changes to home meds/regime   Physical Exam: Vitals:   11/17/22 1953 11/17/22 2319 11/18/22 0035 11/18/22 0442  BP: 114/69 139/78 136/71 (!) 147/78  Pulse: 60 63  (!) 2  Resp: '16 18 18 19  '$ Temp: 98.2 F (36.8 C) 98.3 F (36.8 C) 98.3 F (36.8 C) 98.2 F (36.8 C)  TempSrc: Oral Oral Oral Oral  SpO2: 97% 98%  97%  Weight:    63.9 kg  Height:        GEN- The patient is well appearing, alert and oriented x 3 today.   HEENT: normocephalic, atraumatic; sclera clear, conjunctiva pink; hearing intact; oropharynx clear; neck supple, no JVP Lungs-  CTA b/l, normal work of breathing.  No wheezes, rales, rhonchi Heart- RRR, no murmurs, rubs or gallops, PMI not laterally displaced GI- soft, non-tender, non-distended Extremities- no clubbing, cyanosis, or edema MS- no significant deformity or atrophy Skin- warm and dry, no rash or lesion, left chest without hematoma/ecchymosis Psych- euthymic mood, full affect Neuro- no gross deficits   Labs:  Lab Results  Component Value Date   WBC 4.5 11/16/2022   HGB 12.8 (L) 11/16/2022   HCT 38.3 (L) 11/16/2022   MCV 90.1 11/16/2022   PLT 184 11/16/2022    Recent Labs  Lab 11/16/22 2053 11/17/22 0838 11/18/22 0108  NA 137   < > 136  K 4.4   < > 4.1  CL 104   < > 105  CO2 24   < > 22  BUN 25*   < > 29*  CREATININE 1.60*   < > 1.19  CALCIUM 8.8*   < > 8.9  PROT 6.6   --   --   BILITOT 0.7  --   --   ALKPHOS 81  --   --   ALT 24  --   --   AST 28  --   --   GLUCOSE 182*   < > 90   < > = values in this interval not displayed.    Discharge Medications:  Allergies as of 11/18/2022       Reactions   Terazosin         Medication List     TAKE these medications    acetaminophen 325 MG tablet Commonly known as: TYLENOL Take 650 mg by mouth every 4 (four) hours as needed for mild pain or moderate pain.   tamsulosin 0.4 MG Caps capsule Commonly known as: FLOMAX Take 0.4 mg by mouth.        Disposition: Home Discharge Instructions     Diet - low sodium heart healthy   Complete by: As directed    Increase activity slowly   Complete by: As directed         Duration of Discharge Encounter: Greater than 30 minutes including physician time.  Connor Night, PA-C 11/18/2022 8:44 AM  I have seen and examined this patient with Connor Neal.  Agree with above, note added to reflect my findings.  On exam, RRR, no murmurs, lungs clear.  She is now status post Abbott pacemaker for second degree AV block.  Device functioning appropriately.  Chest x-ray and interrogation without issue.  Plan for discharge today with follow-up in device clinic.  Connor Neal M. Connor Perot MD 11/18/2022 10:23 AM

## 2022-11-17 NOTE — Progress Notes (Addendum)
Rounding Note    Patient Name: Connor Neal Date of Encounter: 11/17/2022  [place] Cardiologist: None   Subjective   Feels "good" this morning, no complaints  Inpatient Medications    Scheduled Meds:  Continuous Infusions:  PRN Meds: acetaminophen, hydrALAZINE, ondansetron (ZOFRAN) IV   Vital Signs    Vitals:   11/17/22 0600 11/17/22 0630 11/17/22 0703 11/17/22 0809  BP: (!) 203/92 (!) 212/90 (!) 182/94 (!) 193/92  Pulse: (!) 53 (!) 41 (!) 58 (!) 57  Resp: 16 (!) 21  16  Temp:      TempSrc:      SpO2: 100% 95% 96% 100%    Intake/Output Summary (Last 24 hours) at 11/17/2022 0908 Last data filed at 11/17/2022 0300 Gross per 24 hour  Intake --  Output 1000 ml  Net -1000 ml      08/29/2022    2:08 PM 08/27/2021   11:46 AM 06/11/2021    1:55 PM  Last 3 Weights  Weight (lbs) 147 lb 8 oz 161 lb 6 oz 152 lb  Weight (kg) 66.906 kg 73.199 kg 68.947 kg      Telemetry    Mostly SR 60's-70's with intermittent Mobitz one and CHB, some with intermittent BBB escape morphology, rates towards 40   - Personally Reviewed  ECG    No new EKGs - Personally Reviewed  Physical Exam   GEN: No acute distress.   Neck: No JVD Cardiac: RRR, bradycardic, no murmurs, rubs, or gallops.  Respiratory: CTA b/l. GI: Soft, nontender, non-distended  MS: No edema; No deformity. Neuro:  Nonfocal  Psych: Normal affect   Labs    High Sensitivity Troponin:   Recent Labs  Lab 11/16/22 1736 11/16/22 2053  TROPONINIHS 26* 32*     Chemistry Recent Labs  Lab 11/16/22 2053  NA 137  K 4.4  CL 104  CO2 24  GLUCOSE 182*  BUN 25*  CREATININE 1.60*  CALCIUM 8.8*  PROT 6.6  ALBUMIN 3.1*  AST 28  ALT 24  ALKPHOS 81  BILITOT 0.7  GFRNONAA 41*  ANIONGAP 9    Lipids No results for input(s): "CHOL", "TRIG", "HDL", "LABVLDL", "LDLCALC", "CHOLHDL" in the last 168 hours.  Hematology Recent Labs  Lab 11/16/22 2053  WBC 4.5  RBC 4.25  HGB 12.8*  HCT  38.3*  MCV 90.1  MCH 30.1  MCHC 33.4  RDW 14.0  PLT 184   Thyroid No results for input(s): "TSH", "FREET4" in the last 168 hours.  BNP Recent Labs  Lab 11/16/22 2053  BNP 574.5*    DDimer No results for input(s): "DDIMER" in the last 168 hours.   Radiology    DG Chest Portable 1 View  Result Date: 11/16/2022 CLINICAL DATA:  Bradycardia.  Dizziness. EXAM: PORTABLE CHEST 1 VIEW COMPARISON:  Radiographs 12/11/2019 and 01/04/2017.  CT 03/18/2005. FINDINGS: The heart size and mediastinal contours are stable with aortic and brachiocephalic tortuosity. There are lower lung volumes with resulting mild bibasilar atelectasis. Mild biapical scarring appears unchanged. No confluent airspace opacity, pleural effusion or pneumothorax. The bones appear stable. Telemetry leads overlie the chest. IMPRESSION: Lower lung volumes with resulting mild bibasilar atelectasis. No acute cardiopulmonary process. Electronically Signed   By: Richardean Sale M.D.   On: 11/16/2022 15:51    Cardiac Studies   Noted in the EP consulted dated 07/26/22 ECG 04/21/2022: sinis bradycardia with Mobitz 1 second degree AV block. QRS  duration is 80 ms and QTc 360 ms.  Transthoracic echocardiogram This was essentially a normal study. Left ventricular systolic function is  normal. Ejection Fraction = >55%. Proximal septal thickening is noted. Right ventricular systolic pressure is normal. Bradycardia noted throughout with occasional HR to 37  Ambulatory monitor (14-day) July 2023 1. Indications for study: Bradycardia, unspecified 2. Background rhythm: Sinus rhythm  3. Noteworthy arrhythmias a) Ventricular tachycardia runs: Fastest heart rate four-beat run 176 - 207 bpm  12:32 hrs. 05/06/2022 VT strip #1 b) Ventricular tachycardia runs: Longest episode 21-beat run 77 - 146 bpm 12:04  hrs. 04/23/2022 VT strip #3 c) VT burden: Nine episodes; heart rate range 51 - 207 bpm  d) Longest pause: 6.8 seconds high-grade A-V block  02:04 hrs. 04/23/2022 Pause  strip #1 e) Second longest pause: 5.5 seconds high-grade A-V block 12:27 hrs. 04/29/2022  Pause strip #2 f) Pause burden: [3.0 sec. or longer] 261 episodes duration range 3.0 - 6.8  seconds  g) A-V block: Third degree long R-R interval 2.24 seconds 02:22 hrs. 04/28/2022  AVB strip #1 h) A-V block burden: Four episodes Ventricular ectopy consisted of isolated PVC (4,716; <1%) bigeminy () and  couplets () 4. Annotated events: none   Patient Profile     89 y.o. male w/PMHx of HTN, HLD, PE, known CHB,  sought attention with a near syncopal spell.  He was previously referred to EP at the [place] on 07/26/22 their note reads: "As a part of pre-operative evaluation for hernia surgery, echocardiogram was  ordered. Echo showed normal LVEF and no major structural abnormalities. While in  the echo holding area, his heart rate was noted to be in the 20s. Follow up  Holter monitor showed heart rate ranging from 21-207 bpm (average 57), VT runs  up to 21 beats, and episodic high grade AV block lasting up to 6.8 seconds at 2  am and 5.5 seconds at midday. Recommended PPM at that time, though he declined.  Yesterday AM had a near syncopal event, he described as a weak, dizzy spell, the worst he has ever had, no syncope.  He also reports that he was not actively taking any home medications on any regular basis, perhaps once in a while, mentioned that they don't seem to work to control his BP anyway.  Assessment & Plan    Heart block of varying degrees including CHB Intermittent BBB morphology on telemetry Known history of the same Previously declined PPM (saw EP in the [place]) No nodal blocking agents   His daughter is at bedside this AM, Dr. Quentin Ore had long discussion with the patient this morningh about rational for PPM implant, the procedure. The patient while he is closer to being agreeable to the procedure, not yet ready. His daughter and her siblings by her reportare all  in favir of him getting a PPM Dr. Quentin Ore has advised that he can no longer drive given his unstable conduction system.  At this time, the patient/daughter Connor Neal discuss further Connor Neal hold him NPO and circle back around later today to revisit his decision   2. Hx of PE He WAS on eliquis though self stopped S[facility] [place] [facility] defer to his PMD out patient, seems they have previously discussed it    3. HTN PRN hydralazine for now Not taking any meds at home, seems he was on losartan somewhere along the way  4. AKI on CKD (III) \follow A little better this AM   For questions or updates, please contact [place] Please consult [email or web address] for contact info under  Signed, Baldwin [place], PA-C  11/17/2022, 9:08 AM    I have seen and examined this patient with Tommye Standard.  Agree with above, note added to reflect my findings.  Continued episodes of heart block. No pain. No further near syncope.  GEN: Well nourished, well developed, in no acute distress  HEENT: normal  Neck: no JVD, carotid bruits, or masses Cardiac: RRR; no murmurs, rubs, or gallops,no edema  Respiratory:  clear to auscultation bilaterally, normal work of breathing GI: soft, nontender, nondistended, + BS MS: no deformity or atrophy  Skin: warm and dry Neuro:  Strength and sensation are intact Psych: euthymic mood, full affect   Heart block: no obvious causes noted. Evanell Redlich plan for pacemaker implant. Risk and benefits discussed. Risks include bleeding, infection, tamponade, pneumothorax. Lead dislodgement. The patient understand the risks and has agreed to the procedure.   Tamon Parkerson M. Lonzell Dorris MD 11/17/2022 4:40 PM

## 2022-11-17 NOTE — ED Notes (Signed)
Pt woke up, used urinal independently. No distress noted, patient denies any dizziness, weakness, pain.

## 2022-11-17 NOTE — ED Notes (Signed)
On-call provider responded to page and stated that we're okay to continue to monitor BP at this time and that unless patient becomes more symptomatic then we are okay to let it ride out where it's at currently.

## 2022-11-17 NOTE — Interval H&P Note (Signed)
History and Physical Interval Note:  11/17/2022 2:24 PM  Connor Neal  has presented today for surgery, with the diagnosis of COMPLETE HEART BLOCK.  The various methods of treatment have been discussed with the patient and family. After consideration of risks, benefits and other options for treatment, the patient has consented to  Procedure(s): PACEMAKER IMPLANT (N/A) as a surgical intervention.  The patient's history has been reviewed, patient examined, no change in status, stable for surgery.  I have reviewed the patient's chart and labs.  Questions were answered to the patient's satisfaction.     Rodnisha Blomgren [employer]

## 2022-11-17 NOTE — Discharge Instructions (Addendum)
     Supplemental Discharge Instructions for  Pacemaker/Defibrillator Patients   Activity No heavy lifting or vigorous activity with your left/right arm for 6 to 8 weeks.  Do not raise your left/right arm above your head for one week.  Gradually raise your affected arm as drawn below.             11/22/22                     11/23/22                      11/24/22                 11/25/22 __  NO DRIVING until cleared to at your wound check visit.  WOUND CARE Keep the wound area clean and dry.  Do not get this area wet , no showers untili cleared to at your wound check visit . The tape/steri-strips on your wound will fall off; do not pull them off.  No bandage is needed on the site.  DO  NOT apply any creams, oils, or ointments to the wound area. If you notice any drainage or discharge from the wound, any swelling or bruising at the site, or you develop a fever > 101? F after you are discharged home, call the office at once.  Special Instructions You are still able to use cellular telephones; use the ear opposite the side where you have your pacemaker/defibrillator.  Avoid carrying your cellular phone near your device. When traveling through airports, show security personnel your identification card to avoid being screened in the metal detectors.  Ask the security personnel to use the hand wand. Avoid arc welding equipment, MRI testing (magnetic resonance imaging), TENS units (transcutaneous nerve stimulators).  Call the office for questions about other devices. Avoid electrical appliances that are in poor condition or are not properly grounded. Microwave ovens are safe to be near or to operate.

## 2022-11-17 NOTE — ED Notes (Signed)
ED TO INPATIENT HANDOFF REPORT  ED Nurse Name and Phone #: Suella Grove [phone]  S Name/Age/Gender Connor Neal 89 y.o. male Room/Bed: 039C/039C  Code Status   Code Status: Full Code  Home/SNF/Other Home Patient oriented to: self, place, time, and situation Is this baseline? Yes   Triage Complete: Triage complete  Chief Complaint Complete heart block ([place]) [I44.2]  Triage Note Pt c/o dizziness that began this am; sent by pcp for further evaluation; pt endorses hx bradycardia, rate currently 30s; denies cp, sob   Allergies Allergies  Allergen Reactions   Terazosin     Level of Care/Admitting Diagnosis ED Disposition     ED Disposition  Admit   Condition  --   [place]: [place] [[id number]]  Level of Care: Telemetry Cardiac [[id number]]  May place patient in observation at [facility] or [facility] if equivalent level of care is available:: No  Covid Evaluation: Asymptomatic - no recent exposure (last 10 days) testing not required  Diagnosis: Complete heart block [facility]) [[id number]]  Admitting Physician: Melida Quitter [[id number]]  Attending Physician: Melida Quitter [[id number]]          B Medical/Surgery History Past Medical History:  Diagnosis Date   Arthritis    BENIGN PROSTATIC HYPERTROPHY 02/13/2008   COLONIC POLYPS 01/04/2008   Diverticulosis of colon (without mention of hemorrhage) 01/04/2008   Frequent headaches    GERD 11/17/2010   Hyperlipidemia    Internal hemorrhoids with other complication 06/01/3663   PROSTATITIS, RECURRENT 01/04/2008   Pulmonary embolism ([place])    PVD 08/17/2010   TOBACCO USE, QUIT 08/14/2009   Past Surgical History:  Procedure Laterality Date   Right wrist fracture       A IV Location/Drains/Wounds Patient Lines/Drains/Airways Status     Active Line/Drains/Airways     Name Placement date Placement time Site Days   Peripheral IV 11/16/22 20 G Anterior;Left Forearm 11/16/22  1525   Forearm  1            Intake/Output Last 24 hours  Intake/Output Summary (Last 24 hours) at 11/17/2022 1112 Last data filed at 11/17/2022 0300 Gross per 24 hour  Intake --  Output 1000 ml  Net -1000 ml    Labs/Imaging Results for orders placed or performed during the hospital encounter of 11/16/22 (from the past 48 hour(s))  Troponin I (High Sensitivity)     Status: Abnormal   Collection Time: 11/16/22  5:36 PM  Result Value Ref Range   Troponin I (High Sensitivity) 26 (H) <18 ng/L    Comment: (NOTE) Elevated high sensitivity troponin I (hsTnI) values and significant  changes across serial measurements may suggest ACS but many other  chronic and acute conditions are known to elevate hsTnI results.  Refer to the "Links" section for chest pain algorithms and additional  guidance. Performed at [facility], [place] [address], [place], [place] [address]   CBC with Differential     Status: Abnormal   Collection Time: 11/16/22  8:53 PM  Result Value Ref Range   WBC 4.5 4.0 - 10.5 K/uL   RBC 4.25 4.22 - 5.81 MIL/uL   Hemoglobin 12.8 (L) 13.0 - 17.0 g/dL   HCT 38.3 (L) 39.0 - 52.0 %   MCV 90.1 80.0 - 100.0 fL   MCH 30.1 26.0 - 34.0 pg   MCHC 33.4 30.0 - 36.0 g/dL   RDW 14.0 11.5 - 15.5 %   Platelets 184 150 -  400 K/uL   nRBC 0.0 0.0 - 0.2 %   Neutrophils Relative % 65 %   Neutro Abs 2.9 1.7 - 7.7 K/uL   Lymphocytes Relative 22 %   Lymphs Abs 1.0 0.7 - 4.0 K/uL   Monocytes Relative 10 %   Monocytes Absolute 0.4 0.1 - 1.0 K/uL   Eosinophils Relative 3 %   Eosinophils Absolute 0.1 0.0 - 0.5 K/uL   Basophils Relative 0 %   Basophils Absolute 0.0 0.0 - 0.1 K/uL   Immature Granulocytes 0 %   Abs Immature Granulocytes 0.01 0.00 - 0.07 K/uL    Comment: Performed at [facility] [address], [place], [place] [address]  Comprehensive metabolic panel     Status: Abnormal   Collection Time: 11/16/22  8:53 PM  Result Value Ref Range   Sodium 137 135 - 145 mmol/L    Potassium 4.4 3.5 - 5.1 mmol/L   Chloride 104 98 - 111 mmol/L   CO2 24 22 - 32 mmol/L   Glucose, Bld 182 (H) 70 - 99 mg/dL    Comment: Glucose reference range applies only to samples taken after fasting for at least 8 hours.   BUN 25 (H) 8 - 23 mg/dL   Creatinine, Ser 1.60 (H) 0.61 - 1.24 mg/dL   Calcium 8.8 (L) 8.9 - 10.3 mg/dL   Total Protein 6.6 6.5 - 8.1 g/dL   Albumin 3.1 (L) 3.5 - 5.0 g/dL   AST 28 15 - 41 U/L   ALT 24 0 - 44 U/L   Alkaline Phosphatase 81 38 - 126 U/L   Total Bilirubin 0.7 0.3 - 1.2 mg/dL   GFR, Estimated 41 (L) >60 mL/min    Comment: (NOTE) Calculated using the CKD-EPI Creatinine Equation (2021)    Anion gap 9 5 - 15    Comment: Performed at [facility], [place] [address], [place], [place] [address]  Troponin I (High Sensitivity)     Status: Abnormal   Collection Time: 11/16/22  8:53 PM  Result Value Ref Range   Troponin I (High Sensitivity) 32 (H) <18 ng/L    Comment: (NOTE) Elevated high sensitivity troponin I (hsTnI) values and significant  changes across serial measurements may suggest ACS but many other  chronic and acute conditions are known to elevate hsTnI results.  Refer to the "Links" section for chest pain algorithms and additional  guidance. Performed at [facility], [place] [address], [place], [place] [address]   Brain natriuretic peptide     Status: Abnormal   Collection Time: 11/16/22  8:53 PM  Result Value Ref Range   B Natriuretic Peptide 574.5 (H) 0.0 - 100.0 pg/mL    Comment: Performed at [facility] [address], [place], [place] [address]  Basic metabolic panel     Status: Abnormal   Collection Time: 11/17/22  8:38 AM  Result Value Ref Range   Sodium 137 135 - 145 mmol/L   Potassium 4.3 3.5 - 5.1 mmol/L   Chloride 104 98 - 111 mmol/L   CO2 25 22 - 32 mmol/L   Glucose, Bld 84 70 - 99 mg/dL    Comment: Glucose reference range applies only to samples taken after fasting for at least 8 hours.   BUN 24 (H) 8 - 23  mg/dL   Creatinine, Ser 1.39 (H) 0.61 - 1.24 mg/dL   Calcium 9.2 8.9 - 10.3 mg/dL   GFR, Estimated 48 (L) >60 mL/min  Comment: (NOTE) Calculated using the CKD-EPI Creatinine Equation (2021)    Anion gap 8 5 - 15    Comment: Performed at [facility], [place] [address], [place], [place] [address]   DG Chest Portable 1 View  Result Date: 11/16/2022 CLINICAL DATA:  Bradycardia.  Dizziness. EXAM: PORTABLE CHEST 1 VIEW COMPARISON:  Radiographs 12/11/2019 and 01/04/2017.  CT 03/18/2005. FINDINGS: The heart size and mediastinal contours are stable with aortic and brachiocephalic tortuosity. There are lower lung volumes with resulting mild bibasilar atelectasis. Mild biapical scarring appears unchanged. No confluent airspace opacity, pleural effusion or pneumothorax. The bones appear stable. Telemetry leads overlie the chest. IMPRESSION: Lower lung volumes with resulting mild bibasilar atelectasis. No acute cardiopulmonary process. Electronically Signed   By: Richardean Sale M.D.   On: 11/16/2022 15:51    Pending Labs Unresulted Labs (From admission, onward)     Start     Ordered   11/17/22 1047  Surgical PCR screen  (Screening)  Once,   R        11/17/22 1046   11/16/22 7939  Basic metabolic panel  ONCE - STAT,   STAT        11/16/22 2043   11/16/22 2044  CBC  ONCE - STAT,   STAT        11/16/22 2043            Vitals/Pain Today's Vitals   11/17/22 0600 11/17/22 0630 11/17/22 0703 11/17/22 0809  BP: (!) 203/92 (!) 212/90 (!) 182/94 (!) 193/92  Pulse: (!) 53 (!) 41 (!) 58 (!) 57  Resp: 16 (!) 21  16  Temp:      TempSrc:      SpO2: 100% 95% 96% 100%  PainSc:    0-No pain    Isolation Precautions No active isolations  Medications Medications  acetaminophen (TYLENOL) tablet 650 mg (has no administration in time range)  ondansetron (ZOFRAN) injection 4 mg (has no administration in time range)  hydrALAZINE (APRESOLINE) injection 10 mg (10 mg Intravenous Given 11/17/22 0832)   0.9 %  sodium chloride infusion (has no administration in time range)  gentamicin (GARAMYCIN) 80 mg in sodium chloride 0.9 % 500 mL irrigation (has no administration in time range)  chlorhexidine (HIBICLENS) 4 % liquid 4 Application (has no administration in time range)  chlorhexidine (HIBICLENS) 4 % liquid 4 Application (has no administration in time range)  sodium chloride flush (NS) 0.9 % injection 3 mL (has no administration in time range)  sodium chloride flush (NS) 0.9 % injection 3 mL (has no administration in time range)  0.9 %  sodium chloride infusion (has no administration in time range)  ceFAZolin (ANCEF) IVPB 2g/100 mL premix (has no administration in time range)    Mobility walks Low fall risk   Focused Assessments Cardiac Assessment Handoff:  Cardiac Rhythm: Heart block, Sinus bradycardia No results found for: "CKTOTAL", "CKMB", "CKMBINDEX", "TROPONINI" No results found for: "DDIMER" Does the Patient currently have chest pain? No    R Recommendations: See Admitting Provider Note  Report given to:   Additional Notes: Pt  suppose to get pacemaker. Hr in the 30's, 40's and 50's. Bp was in the 190'sto 200, but he receive hydrazine 10 mg. It is on his chart prn. He had a near sycopy episode before coming in the ED. He needs to get up with assist. He wont use the Bedpan to do number 2. He want's to use the bedside commode.

## 2022-11-18 ENCOUNTER — Encounter (HOSPITAL_COMMUNITY): Payer: Self-pay | Admitting: Cardiology

## 2022-11-18 ENCOUNTER — Telehealth: Payer: Self-pay | Admitting: Cardiology

## 2022-11-18 ENCOUNTER — Inpatient Hospital Stay (HOSPITAL_COMMUNITY): Payer: Medicare Other

## 2022-11-18 DIAGNOSIS — I441 Atrioventricular block, second degree: Secondary | ICD-10-CM | POA: Diagnosis not present

## 2022-11-18 LAB — BASIC METABOLIC PANEL
Anion gap: 9 (ref 5–15)
BUN: 29 mg/dL — ABNORMAL HIGH (ref 8–23)
CO2: 22 mmol/L (ref 22–32)
Calcium: 8.9 mg/dL (ref 8.9–10.3)
Chloride: 105 mmol/L (ref 98–111)
Creatinine, Ser: 1.19 mg/dL (ref 0.61–1.24)
GFR, Estimated: 58 mL/min — ABNORMAL LOW (ref >60)
Glucose, Bld: 90 mg/dL (ref 70–99)
Potassium: 4.1 mmol/L (ref 3.5–5.1)
Sodium: 136 mmol/L (ref 135–145)

## 2022-11-18 NOTE — Telephone Encounter (Signed)
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  Patient would like to know directly from Dr. 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

## 2022-11-18 NOTE — Progress Notes (Signed)
Discharge instructions reviewed with pt and his son.  Reviewed s/p pacemaker placement instructions and movement restrictions as per handout.  Copy of instructions given to pt./son.  No new scripts. Questions answered.   Pt to be d/c'd via wheelchair with belongings, with son.            To be escorted by hospital volunteer/staff.

## 2022-11-18 NOTE — Plan of Care (Signed)
  Problem: Nutrition: Goal: Adequate nutrition will be maintained Outcome: Completed/Met   Problem: Elimination: Goal: Will not experience complications related to urinary retention Outcome: Completed/Met   Problem: Pain Managment: Goal: General experience of comfort will improve Outcome: Completed/Met

## 2022-11-18 NOTE — TOC Transition Note (Signed)
Transition of Care [facility]) - CM/SW Discharge Note   Patient Details  Name: Morgan Rennert MRN: [id number] Date of Birth: 01/06/1931  [facility]) CM/SW Contact:  Zenon Mayo, RN Phone Number: 11/18/2022, 9:46 AM   Clinical Narrative:    Patient is for dc today, she has no needs. She has transport at dc.          Patient Goals and CMS Choice      Discharge Placement                         Discharge Plan and Services Additional resources added to the After Visit Summary for                                       Social Determinants of Health (SDOH) Interventions SDOH Screenings   Transportation Needs: No Transportation Needs (07/02/2021)  Depression (PHQ2-9): Low Risk  (08/29/2022)  Financial Resource Strain: Low Risk  (07/02/2021)  Tobacco Use: Medium Risk (11/18/2022)     Readmission Risk Interventions     No data to display

## 2022-11-21 ENCOUNTER — Telehealth: Payer: Self-pay

## 2022-11-21 NOTE — Patient Outreach (Signed)
  Care Coordination TOC Note Transition Care Management Follow-up Telephone Call Date of discharge and from where: 11/18/22-[place]   DX: "near syncope, pacer inserted" How have you been since you were released from the hospital? Patient states he is "doing good." Denies any acute issues or concerns at present. He has had no further syncope episodes. He reports no issues with newly inserted pacer. Patient states bandage to area is clean,dry and intact. He has some occasional soreness to area but has not really need to take anything for it.  Any questions or concerns? No  Items Reviewed: Did the pt receive and understand the discharge instructions provided? Yes  Medications obtained and verified? Yes  Other? No  Any new allergies since your discharge? No  Dietary orders reviewed? Yes Do you have support at home? Yes   Home Care and Equipment/Supplies: Were home health services ordered? not applicable If so, what is the name of the agency? N/A  Has the agency set up a time to come to the patient's home? not applicable Were any new equipment or medical supplies ordered?  No What is the name of the medical supply agency? N/A Were you able to get the supplies/equipment? not applicable Do you have any questions related to the use of the equipment or supplies? No  Functional Questionnaire: (I = Independent and D = Dependent) ADLs: I  Bathing/Dressing- I  Meal Prep- I  Eating- I  Maintaining continence- I  Transferring/Ambulation- I  Managing Meds- I  Follow up appointments reviewed:  PCP Hospital f/u appt confirmed? Yes  Scheduled to see Dr. Martinique on 11/30/22 @ [facility] f/u appt confirmed? Yes  Scheduled to see Dr. Chalmers Cater on 12/02/22 @ 9:20am. Are transportation arrangements needed? No  If their condition worsens, is the pt aware to call PCP or go to the Emergency Dept.? Yes Was the patient provided with contact information for the PCP's office or ED?  Yes Was to pt encouraged to call back with questions or concerns? Yes  SDOH assessments and interventions completed:   Yes SDOH Interventions Today    Flowsheet Row Most Recent Value  SDOH Interventions   Food Insecurity Interventions Intervention Not Indicated  Transportation Interventions Intervention Not Indicated       Care Coordination Interventions:  Education provided on post-op care    Encounter Outcome:  Pt. Visit Completed     Enzo Montgomery, RN,BSN,CCM [place] Management Telephonic Care Management Coordinator Direct Phone: [phone] Toll Free: [phone] Fax: [phone]

## 2022-11-21 NOTE — Telephone Encounter (Signed)
Left message to call back

## 2022-11-21 NOTE — Patient Outreach (Signed)
  Care Coordination TOC Note Transition Care Management Unsuccessful Follow-up Telephone Call  Date of discharge and from where:  11/18/22-[facility]  Attempts:  1st Attempt  Reason for unsuccessful TCM follow-up call:  Left voice message    [place] [place] Management Telephonic Care Management Coordinator Direct Phone: [phone] Toll Free: [phone] Fax: [phone]

## 2022-11-21 NOTE — Telephone Encounter (Signed)
Transition Care Management Unsuccessful Follow-up Telephone Call  Date of discharge and from where:  [facility] 1.12.24  Attempts:  1st Attempt  Reason for unsuccessful TCM follow-up call:  Left voice message

## 2022-11-29 ENCOUNTER — Encounter: Payer: Self-pay | Admitting: Cardiology

## 2022-11-29 NOTE — Progress Notes (Unsigned)
Chief Complaint  Patient presents with   Hospitalization Follow-up   HPI: [name] is a 89 y.o. male, who is here today to follow on recent hospitalization. He was admitted on 11/16/2022 and discharged home on 11/18/2022. Presented today ED with near syncope with known of CHB, s/p pacemaker placement on 11/17/22. AKI-resolved during hospitalization.  TCM call on 11/21/2022. Lab Results  Component Value Date   CREATININE 1.19 11/18/2022   BUN 29 (H) 11/18/2022   NA 136 11/18/2022   K 4.1 11/18/2022   CL 105 11/18/2022   CO2 22 11/18/2022   Lab Results  Component Value Date   WBC 4.5 11/16/2022   HGB 12.8 (L) 11/16/2022   HCT 38.3 (L) 11/16/2022   MCV 90.1 11/16/2022   PLT 184 11/16/2022   Review of Systems See other pertinent positives and negatives in HPI.  Current Outpatient Medications on File Prior to Visit  Medication Sig Dispense Refill   acetaminophen (TYLENOL) 325 MG tablet Take 650 mg by mouth every 4 (four) hours as needed for mild pain or moderate pain.      tamsulosin (FLOMAX) 0.4 MG CAPS capsule Take 0.4 mg by mouth.     No current facility-administered medications on file prior to visit.    Past Medical History:  Diagnosis Date   Arthritis    BENIGN PROSTATIC HYPERTROPHY 02/13/2008   COLONIC POLYPS 01/04/2008   Diverticulosis of colon (without mention of hemorrhage) 01/04/2008   Frequent headaches    GERD 11/17/2010   Hyperlipidemia    Internal hemorrhoids with other complication 3/66/2947   PROSTATITIS, RECURRENT 01/04/2008   Pulmonary embolism ([place])    PVD 08/17/2010   TOBACCO USE, QUIT 08/14/2009   Allergies  Allergen Reactions   Terazosin     Social History   Socioeconomic History   Marital status: Single    Spouse name: Not on file   Number of children: 3   Years of education: Not on file   Highest education level: Not on file  Occupational History   Not on file  Tobacco Use   Smoking status: Former    Types: Cigarettes     Quit date: 64    Years since quitting: 66.1   Smokeless tobacco: Never  Substance and Sexual Activity   Alcohol use: No   Drug use: No   Sexual activity: Not on file  Other Topics Concern   Not on file  Social History Narrative   Not on file   Social Determinants of Health   Financial Resource Strain: Low Risk  (07/02/2021)   Overall Financial Resource Strain (CARDIA)    Difficulty of Paying Living Expenses: Not hard at all  Food Insecurity: No Food Insecurity (11/21/2022)   Hunger Vital Sign    Worried About Running Out of Food in the Last Year: Never true    Ran Out of Food in the Last Year: Never true  Transportation Needs: No Transportation Needs (11/21/2022)   PRAPARE - Hydrologist (Medical): No    Lack of Transportation (Non-Medical): No  Physical Activity: Not on file  Stress: Not on file  Social Connections: Not on file    Vitals:   11/30/22 1341  BP: 120/70  Pulse: 75  Temp: 98.6 F (37 C)  SpO2: 98%   Body mass index is 21.59 kg/m.  Physical Exam Nursing note reviewed.  Constitutional:      General: He is not in acute distress.    Appearance: He  is well-developed.  HENT:     Head: Normocephalic and atraumatic.  Eyes:     Conjunctiva/sclera: Conjunctivae normal.  Cardiovascular:     Rate and Rhythm: Normal rate and regular rhythm.     Pulses:          Dorsalis pedis pulses are 2+ on the right side and 2+ on the left side.     Heart sounds: No murmur (SEM I/VI RUSB and LUSB) heard. Pulmonary:     Effort: Pulmonary effort is normal. No respiratory distress.     Breath sounds: Normal breath sounds.  Abdominal:     Palpations: Abdomen is soft. There is no hepatomegaly or mass.     Tenderness: There is no abdominal tenderness.  Lymphadenopathy:     Cervical: No cervical adenopathy.  Skin:    General: Skin is warm.     Findings: No erythema or rash.  Neurological:     Mental Status: He is alert and oriented to  person, place, and time.     Cranial Nerves: No cranial nerve deficit.     Gait: Gait normal.  Psychiatric:     Comments: Well groomed, good eye contact.     ASSESSMENT AND PLAN:  There are no diagnoses linked to this encounter.  No orders of the defined types were placed in this encounter.   No problem-specific Assessment & Plan notes found for this encounter.   No follow-ups on file.  Connor Alvi G. Martinique, MD  [facility]. [place] office.

## 2022-11-30 ENCOUNTER — Encounter: Payer: Self-pay | Admitting: Family Medicine

## 2022-11-30 ENCOUNTER — Ambulatory Visit (INDEPENDENT_AMBULATORY_CARE_PROVIDER_SITE_OTHER): Payer: Medicare Other | Admitting: Family Medicine

## 2022-11-30 VITALS — BP 120/70 | HR 75 | Temp 98.6°F | Resp 16 | Ht 70.0 in | Wt 150.5 lb

## 2022-11-30 DIAGNOSIS — I2699 Other pulmonary embolism without acute cor pulmonale: Secondary | ICD-10-CM

## 2022-11-30 DIAGNOSIS — I442 Atrioventricular block, complete: Secondary | ICD-10-CM

## 2022-11-30 DIAGNOSIS — I739 Peripheral vascular disease, unspecified: Secondary | ICD-10-CM

## 2022-11-30 DIAGNOSIS — I10 Essential (primary) hypertension: Secondary | ICD-10-CM | POA: Diagnosis not present

## 2022-11-30 NOTE — Assessment & Plan Note (Signed)
In 2016. He stopped Eliquis over a year ago, he did not want to continue it. Instructed about warning signs.

## 2022-11-30 NOTE — Patient Instructions (Addendum)
A few things to remember from today's visit:  Complete heart block ([place])  Essential (primary) hypertension - Plan: Basic metabolic panel  Continue monitoring blood pressure every 1-2 weeks. Keep appt with cardiologist. Avoid driving for now.  If you need refills for medications you take chronically, please call your pharmacy. Do not use My Chart to request refills or for acute issues that need immediate attention. If you send a my chart message, it may take a few days to be addressed, specially if I am not in the office.  Please be sure medication list is accurate. If a new problem present, please set up appointment sooner than planned today.

## 2022-12-01 ENCOUNTER — Encounter: Payer: Self-pay | Admitting: Family Medicine

## 2022-12-01 LAB — BASIC METABOLIC PANEL
BUN: 32 mg/dL — ABNORMAL HIGH (ref 6–23)
CO2: 25 mEq/L (ref 19–32)
Calcium: 9.1 mg/dL (ref 8.4–10.5)
Chloride: 105 mEq/L (ref 96–112)
Creatinine, Ser: 1.18 mg/dL (ref 0.40–1.50)
GFR: 54.63 mL/min — ABNORMAL LOW (ref >60.00)
Glucose, Bld: 110 mg/dL — ABNORMAL HIGH (ref 70–99)
Potassium: 4.3 mEq/L (ref 3.5–5.1)
Sodium: 138 mEq/L (ref 135–145)

## 2022-12-01 NOTE — Assessment & Plan Note (Signed)
He is not on antiplatelet therapy nor statin medication. He is not interested in resuming Atorvastatin.Last LDL 74 in 03/2020. Given his age and risk for bleeding, I am not recommending starting Aspirin at this time.

## 2022-12-01 NOTE — Progress Notes (Signed)
Wound check appointment.  Steri-strips removed. Wound without redness or edema. Incision edges approximated, wound well healed. Normal device function. Thresholds, sensing, and impedances consistent with implant measurements. Device programmed at 3.5V on for extra safety margin until 3 month visit. Histogram distribution appropriate for patient and level of activity. No mode switches or high ventricular rates noted. Patient educated about wound care, arm mobility, lifting restrictions. ROV in 3 months with implanting physician

## 2022-12-01 NOTE — Assessment & Plan Note (Signed)
Problem is well controlled. Continue non pharmacologic treatment. Continue monitoring BP q 1-2 weeks. Low salt/DASH diet.

## 2022-12-01 NOTE — Assessment & Plan Note (Signed)
S/P pacemaker placement on 11/17/22. Surgical area is healing well. He has an appt with his cardiologist later this week.

## 2022-12-02 ENCOUNTER — Ambulatory Visit: Payer: Medicare Other | Attending: Student | Admitting: Cardiology

## 2022-12-02 DIAGNOSIS — Z950 Presence of cardiac pacemaker: Secondary | ICD-10-CM

## 2022-12-02 DIAGNOSIS — I442 Atrioventricular block, complete: Secondary | ICD-10-CM

## 2022-12-02 LAB — CUP PACEART INCLINIC DEVICE CHECK
Battery Remaining Longevity: 60 mo
Battery Voltage: 3.05 V
Brady Statistic RA Percent Paced: 44 %
Brady Statistic RV Percent Paced: 99.69 %
Date Time Interrogation Session: 20240126104433
Implantable Lead Connection Status: 753985
Implantable Lead Connection Status: 753985
Implantable Lead Implant Date: 20240111
Implantable Lead Implant Date: 20240111
Implantable Lead Location: 753859
Implantable Lead Location: 753860
Implantable Pulse Generator Implant Date: 20240111
Lead Channel Impedance Value: 400 Ohm
Lead Channel Impedance Value: 450 Ohm
Lead Channel Pacing Threshold Amplitude: 0.5 V
Lead Channel Pacing Threshold Amplitude: 0.5 V
Lead Channel Pacing Threshold Amplitude: 1.25 V
Lead Channel Pacing Threshold Amplitude: 1.25 V
Lead Channel Pacing Threshold Pulse Width: 0.5 ms
Lead Channel Pacing Threshold Pulse Width: 0.5 ms
Lead Channel Pacing Threshold Pulse Width: 0.5 ms
Lead Channel Pacing Threshold Pulse Width: 0.5 ms
Lead Channel Sensing Intrinsic Amplitude: 2.5 mV
Lead Channel Setting Pacing Amplitude: 3.5 V
Lead Channel Setting Pacing Amplitude: 3.5 V
Lead Channel Setting Pacing Pulse Width: 0.5 ms
Lead Channel Setting Sensing Sensitivity: 2 mV
Pulse Gen Model: 2272
Pulse Gen Serial Number: 8145709

## 2022-12-02 NOTE — Patient Instructions (Signed)
Medication Instructions:   Your physician recommends that you continue on your current medications as directed. Please refer to the Current Medication list given to you today.   *If you need a refill on your cardiac medications before your next appointment, please call your pharmacy*   Lab Work:  None ordered.  If you have labs (blood work) drawn today and your tests are completely normal, you will receive your results only by: [place] (if you have MyChart) OR A paper copy in the mail If you have any lab test that is abnormal or we need to change your treatment, we will call you to review the results.   Testing/Procedures:  None ordered.   Follow-Up: At [facility], you and your health needs are our priority.  As part of our continuing mission to provide you with exceptional heart care, we have created designated Provider Care Teams.  These Care Teams include your primary Cardiologist (physician) and Advanced Practice Providers (APPs -  Physician Assistants and Nurse Practitioners) who all work together to provide you with the care you need, when you need it.  We recommend signing up for the patient portal called "MyChart".  Sign up information is provided on this After Visit Summary.  MyChart is used to connect with patients for Virtual Visits (Telemedicine).  Patients are able to view lab/test results, encounter notes, upcoming appointments, etc.  Non-urgent messages can be sent to your provider as well.   To learn more about what you can do with MyChart, go to [email or web address].    Your next appointment:   3 month(s)  Provider:   Allegra Lai, MD

## 2023-01-27 IMAGING — DX DG CERVICAL SPINE COMPLETE 4+V
5 series · 5 of 5 positions shown · non-contrast
Comparison: None.

CLINICAL DATA: 2 months of mid cervical pain, reports this as new.
No hx of trauma. Limitation of ROM.

EXAM:
CERVICAL SPINE - COMPLETE 4+ VIEW

[c-spine lat]
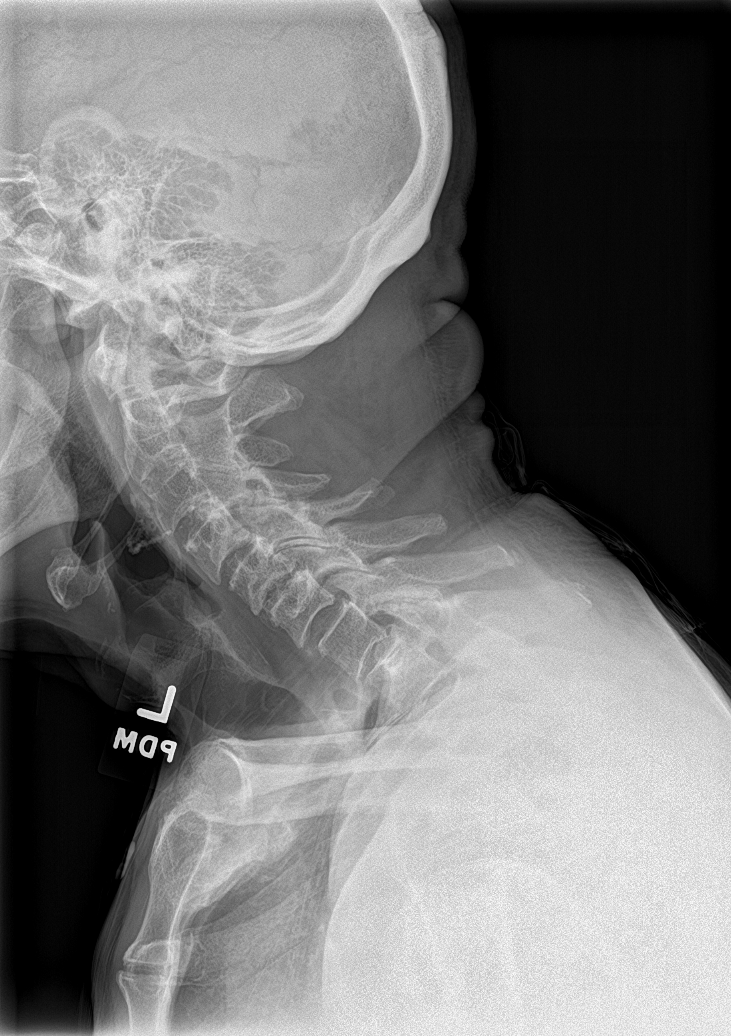

[c-spine obl (1 of 2)]
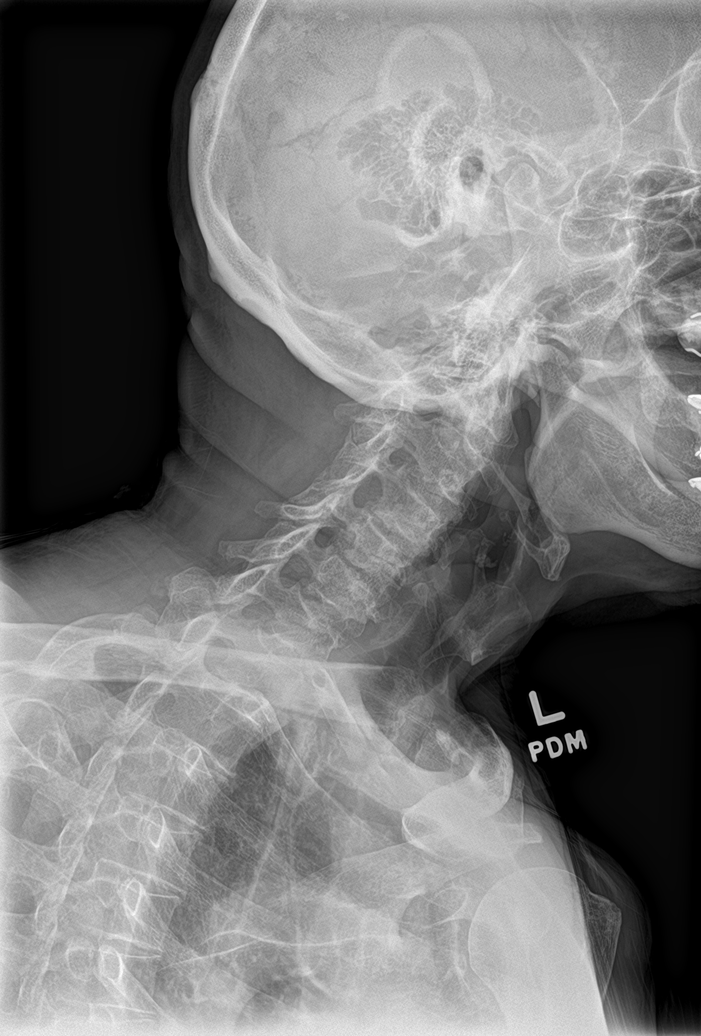

[c-spine obl (2 of 2)]
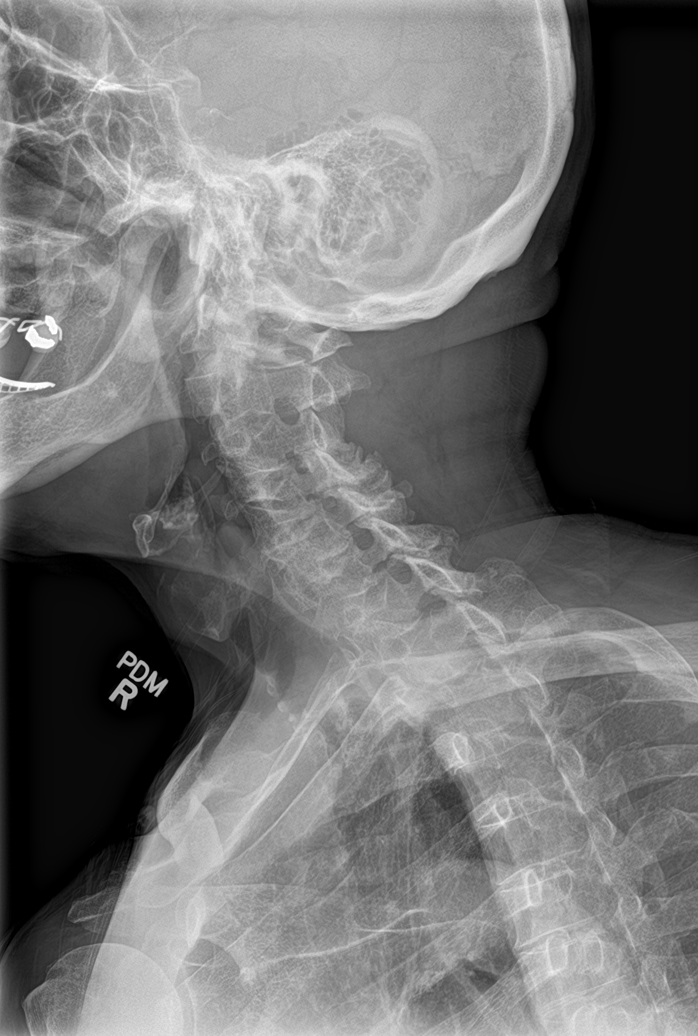

[c-spine ap]
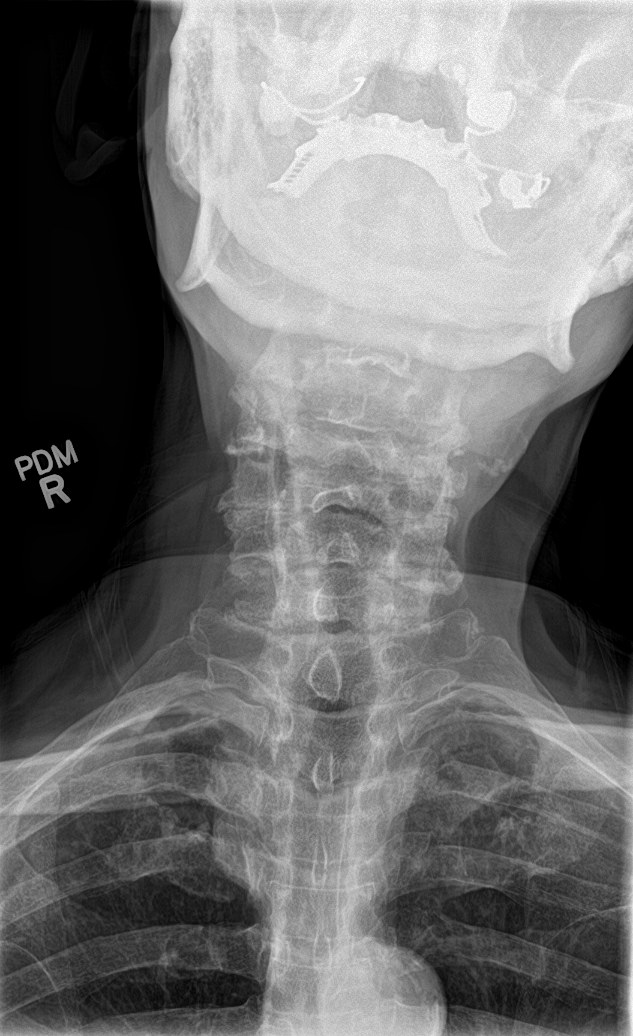

[c-spine open mouth]
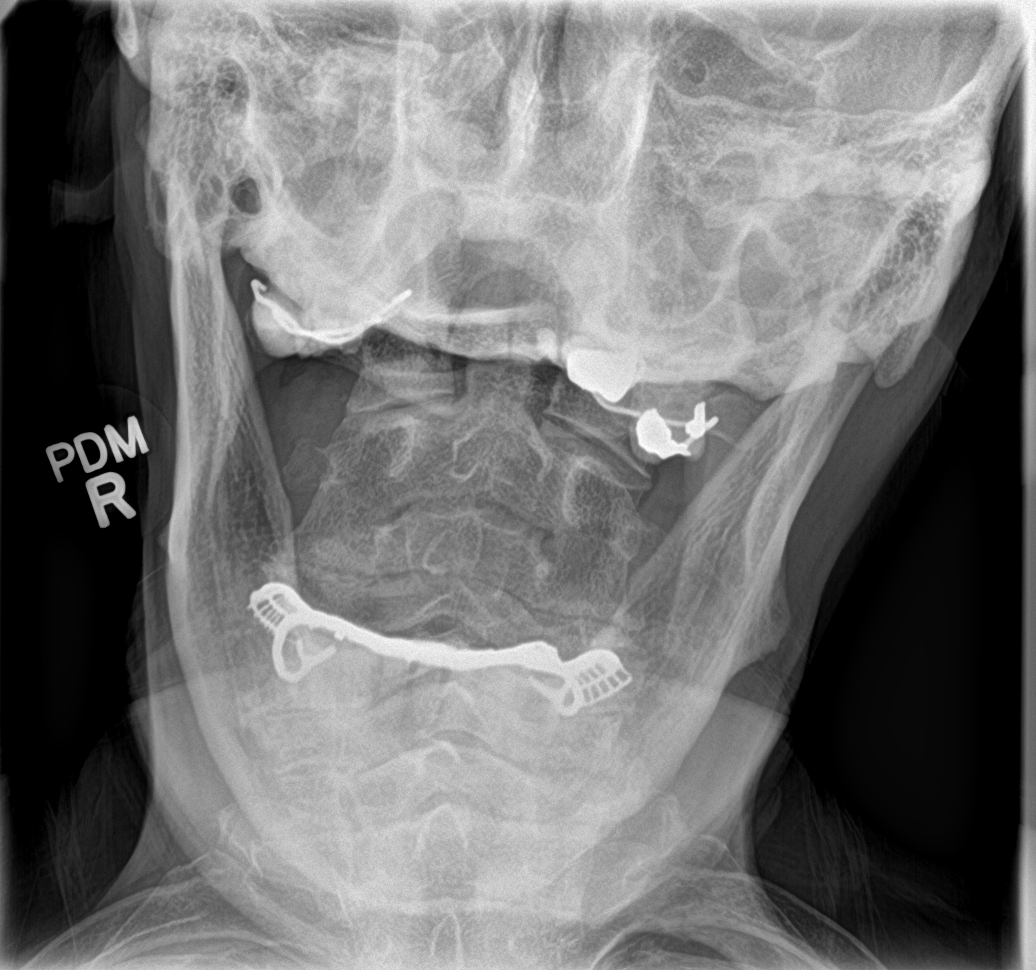

[5 of 5 positions shown; findings below may reference images not displayed]

FINDINGS: Grade 1 anterolisthesis at C5-C6 and C6-C7 measuring 2 mm. There is
multilevel mild-to-moderate degenerative disc disease, worst at
C5-C6. There is moderate multilevel facet arthropathy. There is
likely neural foraminal narrowing at C3-C4 and C4-C5.
IMPRESSION: Mild-to-moderate multilevel degenerative disc disease, worst at
C5-C6. Moderate multilevel facet arthropathy.

Degenerative grade 1 anterolisthesis at C5-C6 and C6-C7 measuring 2
mm.

Likely neural foraminal narrowing at C3-C4 and C4-C5.

## 2023-02-16 ENCOUNTER — Encounter: Payer: Self-pay | Admitting: Cardiology

## 2023-02-16 ENCOUNTER — Ambulatory Visit: Payer: Medicare Other | Attending: Cardiology | Admitting: Cardiology

## 2023-02-16 ENCOUNTER — Telehealth: Payer: Self-pay | Admitting: Family Medicine

## 2023-02-16 VITALS — BP 144/82 | HR 60 | Ht 70.0 in | Wt 149.0 lb

## 2023-02-16 DIAGNOSIS — I10 Essential (primary) hypertension: Secondary | ICD-10-CM

## 2023-02-16 DIAGNOSIS — I442 Atrioventricular block, complete: Secondary | ICD-10-CM | POA: Diagnosis not present

## 2023-02-16 LAB — CUP PACEART REMOTE DEVICE CHECK
Battery Remaining Longevity: 56 mo
Battery Remaining Percentage: 95.5 %
Battery Voltage: 2.99 V
Brady Statistic AP VP Percent: 43 %
Brady Statistic AP VS Percent: 1 %
Brady Statistic AS VP Percent: 56 %
Brady Statistic AS VS Percent: 1 %
Brady Statistic RA Percent Paced: 43 %
Brady Statistic RV Percent Paced: 99 %
Date Time Interrogation Session: 20240411105919
Implantable Lead Connection Status: 753985
Implantable Lead Connection Status: 753985
Implantable Lead Implant Date: 20240111
Implantable Lead Implant Date: 20240111
Implantable Lead Location: 753859
Implantable Lead Location: 753860
Implantable Pulse Generator Implant Date: 20240111
Lead Channel Impedance Value: 430 Ohm
Lead Channel Impedance Value: 430 Ohm
Lead Channel Pacing Threshold Amplitude: 0.5 V
Lead Channel Pacing Threshold Amplitude: 1.25 V
Lead Channel Pacing Threshold Pulse Width: 0.5 ms
Lead Channel Pacing Threshold Pulse Width: 0.5 ms
Lead Channel Sensing Intrinsic Amplitude: 5 mV
Lead Channel Sensing Intrinsic Amplitude: 7 mV
Lead Channel Setting Pacing Amplitude: 3.5 V
Lead Channel Setting Pacing Amplitude: 3.5 V
Lead Channel Setting Pacing Pulse Width: 0.5 ms
Lead Channel Setting Sensing Sensitivity: 2 mV
Pulse Gen Model: 2272
Pulse Gen Serial Number: 8145709

## 2023-02-16 NOTE — Telephone Encounter (Signed)
Contacted Venita Lick to schedule their annual wellness visit. Patient declined to schedule AWV at this time.  DO NOT CALL  Rudell Cobb AWV direct phone # [phone]   Spoke with patient to schedule his AWV.  He stated he is not interested and DO NOT CALL

## 2023-02-16 NOTE — Progress Notes (Signed)
Electrophysiology Office Note   Date:  02/16/2023   ID:  Connor Neal, DOB 01-24-1931, MRN [id number]  PCP:  [place], Betty G, MD  Cardiologist:   Primary Electrophysiologist:  Shereen Marton Jorja Loa, MD    Chief Complaint: pacemaker   History of Present Illness: Connor Neal is a 89 y.o. male who is being seen today for the evaluation of pacemaker at the request of [place], Timoteo Expose, MD. Presenting today for electrophysiology evaluation.  She has a history significant for hypertension, hyperlipidemia.  She presented to the hospital with intermittent lightheadedness.  She was found to have complete heart block.  She is now status post Abbott dual-chamber pacemaker implanted 11/17/2022.  Today, he denies symptoms of palpitations, chest pain, shortness of breath, orthopnea, PND, lower extremity edema, claudication, dizziness, presyncope, syncope, bleeding, or neurologic sequela. The patient is tolerating medications without difficulties.  He has been having some chest pain.  Pain is not changed with exertion or rest.  It happens at random times and he cannot find a pattern.  Does not change with changing positions or with deep breathing.   Past Medical History:  Diagnosis Date   Arthritis    BENIGN PROSTATIC HYPERTROPHY 02/13/2008   COLONIC POLYPS 01/04/2008   Diverticulosis of colon (without mention of hemorrhage) 01/04/2008   Frequent headaches    GERD 11/17/2010   Hyperlipidemia    Internal hemorrhoids with other complication 01/04/2008   PROSTATITIS, RECURRENT 01/04/2008   Pulmonary embolism    PVD 08/17/2010   TOBACCO USE, QUIT 08/14/2009   Past Surgical History:  Procedure Laterality Date   PACEMAKER IMPLANT N/A 11/17/2022   Procedure: PACEMAKER IMPLANT;  Surgeon: Regan Lemming, MD;  Location: [facility];  Service: Cardiovascular;  Laterality: N/A;   Right wrist fracture       Current Outpatient Medications  Medication Sig Dispense Refill    acetaminophen (TYLENOL) 325 MG tablet Take 650 mg by mouth every 4 (four) hours as needed for mild pain or moderate pain.      tamsulosin (FLOMAX) 0.4 MG CAPS capsule Take 0.4 mg by mouth.     No current facility-administered medications for this visit.    Allergies:   Terazosin   Social History:  The patient  reports that he quit smoking about 66 years ago. His smoking use included cigarettes. He has never used smokeless tobacco. He reports that he does not drink alcohol and does not use drugs.   Family History:  The patient's family history includes CAD in his mother and sister.    ROS:  Please see the history of present illness.   Otherwise, review of systems is positive for none.   All other systems are reviewed and negative.    PHYSICAL EXAM: VS:  BP (!) 144/82   Pulse 60   Ht 5\' 10"  (1.778 m)   Wt 149 lb (67.6 kg)   SpO2 99%   BMI 21.38 kg/m  , BMI Body mass index is 21.38 kg/m. GEN: Well nourished, well developed, in no acute distress  HEENT: normal  Neck: no JVD, carotid bruits, or masses Cardiac: RRR; no murmurs, rubs, or gallops,no edema  Respiratory:  clear to auscultation bilaterally, normal work of breathing GI: soft, nontender, nondistended, + BS MS: no deformity or atrophy  Skin: warm and dry, device pocket is well healed Neuro:  Strength and sensation are intact Psych: euthymic mood, full affect  EKG:  EKG is ordered today. Personal review of the ekg ordered  shows intermittent atrial pacing, ventricular paced  Device interrogation is reviewed today in detail.  See PaceArt for details.   Recent Labs: 08/29/2022: TSH 1.67 11/16/2022: ALT 24; B Natriuretic Peptide 574.5; Hemoglobin 12.8; Platelets 184 11/30/2022: BUN 32; Creatinine, Ser 1.18; Potassium 4.3; Sodium 138    Lipid Panel     Component Value Date/Time   CHOL 159 03/23/2020 1257   TRIG 63.0 03/23/2020 1257   HDL 72.20 03/23/2020 1257   CHOLHDL 2 03/23/2020 1257   VLDL 12.6 03/23/2020 1257    LDLCALC 74 03/23/2020 1257   LDLDIRECT 111.0 08/14/2009 0944     Wt Readings from Last 3 Encounters:  02/16/23 149 lb (67.6 kg)  11/30/22 150 lb 8 oz (68.3 kg)  11/18/22 140 lb 14 oz (63.9 kg)      Other studies Reviewed: Additional studies/ records that were reviewed today include: TTE  Review of the above records today demonstrates:  This was essentially a normal study. Left ventricular systolic function is  normal. Ejection Fraction = >55%. Proximal septal thickening is noted. Right ventricular systolic pressure is normal. Bradycardia noted throughout with occasional HR to 37    ASSESSMENT AND PLAN:  1.  Complete heart block: Status post Abbott dual-chamber pacemaker implanted 11/17/2022.  Device functioning appropriately.  Sensing, threshold, impedance within normal limits.  No changes at this time.  2.  Hypertension: Currently well-controlled  3.  Hyperlipidemia: Continue management per primary physician  4.  Chest pain: Based on his symptoms, his pain is atypical for cardiac sources of pain.  The patient is okay not looking into this further.  He Lynnet Hefley call us back if the pain continues.  Current medicines are reviewed at length with the patient today.   The patient does not have concerns regarding his medicines.  The following changes were made today:  none  Labs/ tests ordered today include:  Orders Placed This Encounter  Procedures   EKG 12-Lead     Disposition:   FU with Yaslyn Cumby 1 year  Signed, Oneda Duffett Jorja Loa, MD  02/16/2023 1:59 PM     [facility] [address] [place] [address] [phone] (office) [phone] (fax)

## 2023-02-17 ENCOUNTER — Ambulatory Visit (INDEPENDENT_AMBULATORY_CARE_PROVIDER_SITE_OTHER): Payer: Medicare Other

## 2023-02-17 DIAGNOSIS — I442 Atrioventricular block, complete: Secondary | ICD-10-CM | POA: Diagnosis not present

## 2023-03-23 NOTE — Progress Notes (Signed)
Remote pacemaker transmission.   

## 2023-05-19 ENCOUNTER — Ambulatory Visit (INDEPENDENT_AMBULATORY_CARE_PROVIDER_SITE_OTHER): Payer: Medicare Other

## 2023-05-19 DIAGNOSIS — I442 Atrioventricular block, complete: Secondary | ICD-10-CM

## 2023-05-22 LAB — CUP PACEART REMOTE DEVICE CHECK
Battery Remaining Longevity: 95 mo
Battery Remaining Percentage: 95.5 %
Battery Voltage: 2.99 V
Brady Statistic AP VP Percent: 41 %
Brady Statistic AP VS Percent: 1 %
Brady Statistic AS VP Percent: 59 %
Brady Statistic AS VS Percent: 1 %
Brady Statistic RA Percent Paced: 40 %
Brady Statistic RV Percent Paced: 99 %
Date Time Interrogation Session: 20240712174623
Implantable Lead Connection Status: 753985
Implantable Lead Connection Status: 753985
Implantable Lead Implant Date: 20240111
Implantable Lead Implant Date: 20240111
Implantable Lead Location: 753859
Implantable Lead Location: 753860
Implantable Pulse Generator Implant Date: 20240111
Lead Channel Impedance Value: 360 Ohm
Lead Channel Impedance Value: 430 Ohm
Lead Channel Pacing Threshold Amplitude: 0.5 V
Lead Channel Pacing Threshold Amplitude: 1.5 V
Lead Channel Pacing Threshold Pulse Width: 0.5 ms
Lead Channel Pacing Threshold Pulse Width: 0.5 ms
Lead Channel Sensing Intrinsic Amplitude: 4.1 mV
Lead Channel Sensing Intrinsic Amplitude: 8.2 mV
Lead Channel Setting Pacing Amplitude: 1.75 V
Lead Channel Setting Pacing Amplitude: 2.5 V
Lead Channel Setting Pacing Pulse Width: 0.5 ms
Lead Channel Setting Sensing Sensitivity: 2 mV
Pulse Gen Model: 2272
Pulse Gen Serial Number: 8145709

## 2023-06-05 NOTE — Progress Notes (Signed)
Remote pacemaker transmission.   

## 2023-08-18 ENCOUNTER — Ambulatory Visit (INDEPENDENT_AMBULATORY_CARE_PROVIDER_SITE_OTHER): Payer: Medicare Other

## 2023-08-18 DIAGNOSIS — I442 Atrioventricular block, complete: Secondary | ICD-10-CM

## 2023-08-22 LAB — CUP PACEART REMOTE DEVICE CHECK
Battery Remaining Longevity: 92 mo
Battery Remaining Percentage: 93 %
Battery Voltage: 2.99 V
Brady Statistic AP VP Percent: 40 %
Brady Statistic AP VS Percent: 1 %
Brady Statistic AS VP Percent: 59 %
Brady Statistic AS VS Percent: 1 %
Brady Statistic RA Percent Paced: 40 %
Brady Statistic RV Percent Paced: 99 %
Date Time Interrogation Session: 20241015082924
Implantable Lead Connection Status: 753985
Implantable Lead Connection Status: 753985
Implantable Lead Implant Date: 20240111
Implantable Lead Implant Date: 20240111
Implantable Lead Location: 753859
Implantable Lead Location: 753860
Implantable Pulse Generator Implant Date: 20240111
Lead Channel Impedance Value: 350 Ohm
Lead Channel Impedance Value: 440 Ohm
Lead Channel Pacing Threshold Amplitude: 0.5 V
Lead Channel Pacing Threshold Amplitude: 1.625 V
Lead Channel Pacing Threshold Pulse Width: 0.5 ms
Lead Channel Pacing Threshold Pulse Width: 0.5 ms
Lead Channel Sensing Intrinsic Amplitude: 11.8 mV
Lead Channel Sensing Intrinsic Amplitude: 4.5 mV
Lead Channel Setting Pacing Amplitude: 1.875
Lead Channel Setting Pacing Amplitude: 2.5 V
Lead Channel Setting Pacing Pulse Width: 0.5 ms
Lead Channel Setting Sensing Sensitivity: 2 mV
Pulse Gen Model: 2272
Pulse Gen Serial Number: 8145709

## 2023-08-28 NOTE — Progress Notes (Signed)
Remote pacemaker transmission.   

## 2023-11-06 ENCOUNTER — Ambulatory Visit: Payer: Self-pay | Admitting: Family Medicine

## 2023-11-06 NOTE — Telephone Encounter (Signed)
  Chief Complaint: tingling in both feet Symptoms: tingling in feet, numbness in heels Frequency: 1 year Pertinent Negatives: Patient denies discoloration, injury, increased edema Disposition: [] ED /[] Urgent Care (no appt availability in office) / [x] Appointment(In office/virtual)/ []  [facility]/ [] Home Care/ [] Refused Recommended Disposition /[] [facility] [place] [facility]/ []  Follow-up with PCP Additional Notes: Pt called stating he has had tingling in both of his feet x 1 year. States it is not getting better or worse, states his feeling is normal but has some numbness in his heels. Denies any injury to either foot or wounds, denies bruising or redness in both feet. Per protocol, pt to be evaluated within 2 weeks. Pt scheduled for 11/09/23 at 1430. Care advice reviewed, pt verbalized understanding. Alerting PCP for review.   Copied from CRM [id number]. Topic: Clinical - Red Word Triage >> Nov 06, 2023 10:28 AM Isabell A wrote: Red Word that prompted transfer to Nurse Triage: Poor circulation in feet/numbness Reason for Disposition  [1] MILD pain (e.g., does not interfere with normal activities) AND [2] present > 7 days  Answer Assessment - Initial Assessment Questions 1. ONSET: "When did the pain start?"      Approx 1 year 2. LOCATION: "Where is the pain located?"      Both feet 3. PAIN: "How bad is the pain?"    (Scale 1-10; or mild, moderate, severe)  - MILD (1-3): doesn't interfere with normal activities.   - MODERATE (4-7): interferes with normal activities (e.g., work or school) or awakens from sleep, limping.   - SEVERE (8-10): excruciating pain, unable to do any normal activities, unable to walk.      5/10- burning 4. WORK OR EXERCISE: "Has there been any recent work or exercise that involved this part of the body?"      denies 5. CAUSE: "What do you think is causing the foot pain?"     unknown 6. OTHER SYMPTOMS: "Do you have any other symptoms?" (e.g., leg pain, rash, fever,  numbness)     Burning in feet  Protocols used: Foot Pain-A-AH

## 2023-11-09 ENCOUNTER — Ambulatory Visit (INDEPENDENT_AMBULATORY_CARE_PROVIDER_SITE_OTHER): Payer: Medicare Other | Admitting: Internal Medicine

## 2023-11-09 ENCOUNTER — Encounter: Payer: Self-pay | Admitting: Internal Medicine

## 2023-11-09 VITALS — BP 128/64 | HR 66 | Temp 97.6°F | Wt 149.3 lb

## 2023-11-09 DIAGNOSIS — R296 Repeated falls: Secondary | ICD-10-CM | POA: Insufficient documentation

## 2023-11-09 DIAGNOSIS — R202 Paresthesia of skin: Secondary | ICD-10-CM | POA: Diagnosis not present

## 2023-11-09 LAB — VITAMIN D 25 HYDROXY (VIT D DEFICIENCY, FRACTURES): VITD: 19.34 ng/mL — ABNORMAL LOW (ref 30.00–100.00)

## 2023-11-09 LAB — VITAMIN B12: Vitamin B-12: 657 pg/mL (ref 211–911)

## 2023-11-09 LAB — TSH: TSH: 2.31 u[IU]/mL (ref 0.35–5.50)

## 2023-11-09 NOTE — Progress Notes (Signed)
     Established Patient Office Visit     CC/Reason for Visit: Bilateral feet tingling  HPI: Connor Neal is a 89 y.o. male who is coming in today for the above mentioned reasons.  The tingling has been present for over a year.  He has been noted to have B12 deficiency.  He sometimes has difficulty walking because of it.  No weakness or upper extremity involvement.   Past Medical/Surgical History: Past Medical History:  Diagnosis Date   Arthritis    BENIGN PROSTATIC HYPERTROPHY 02/13/2008   COLONIC POLYPS 01/04/2008   Diverticulosis of colon (without mention of hemorrhage) 01/04/2008   Frequent headaches    GERD 11/17/2010   Hyperlipidemia    Internal hemorrhoids with other complication 01/04/2008   PROSTATITIS, RECURRENT 01/04/2008   Pulmonary embolism (HCC)    PVD 08/17/2010   TOBACCO USE, QUIT 08/14/2009    Past Surgical History:  Procedure Laterality Date   PACEMAKER IMPLANT N/A 11/17/2022   Procedure: PACEMAKER IMPLANT;  Surgeon: Inocencio Soyla Lunger, MD;  Location: [facility];  Service: Cardiovascular;  Laterality: N/A;   Right wrist fracture      Social History:  reports that he quit smoking about 67 years ago. His smoking use included cigarettes. He has never used smokeless tobacco. He reports that he does not drink alcohol and does not use drugs.  Allergies: Allergies  Allergen Reactions   Terazosin     Family History:  Family History  Problem Relation Age of Onset   CAD Mother    CAD Sister      Current Outpatient Medications:    acetaminophen  (TYLENOL ) 325 MG tablet, Take 650 mg by mouth every 4 (four) hours as needed for mild pain or moderate pain. , Disp: , Rfl:    tamsulosin  (FLOMAX ) 0.4 MG CAPS capsule, Take 0.4 mg by mouth., Disp: , Rfl:   Review of Systems:  Negative unless indicated in HPI.   Physical Exam: Vitals:   11/09/23 1508  BP: 128/64  Pulse: 66  Temp: 97.6 F (36.4 C)  SpO2: 96%  Weight: 149 lb 4.8 oz (67.7 kg)     Body mass index is 21.42 kg/m.    Impression and Plan:  Tingling of both feet -     Vitamin B12; Future -     TSH; Future -     VITAMIN D  25 Hydroxy (Vit-D Deficiency, Fractures); Future   -Rule out hypothyroidism and B12 deficiency as potential causes.  If lab work normal then this is likely peripheral neuropathy.  He can follow-up with his PCP.  Time spent:30 minutes reviewing chart, interviewing and examining patient and formulating plan of care.     Tully Theophilus Andrews, MD [facility]

## 2023-11-13 ENCOUNTER — Other Ambulatory Visit: Payer: Self-pay | Admitting: Internal Medicine

## 2023-11-13 ENCOUNTER — Encounter: Payer: Self-pay | Admitting: Internal Medicine

## 2023-11-13 DIAGNOSIS — E559 Vitamin D deficiency, unspecified: Secondary | ICD-10-CM

## 2023-11-13 MED ORDER — VITAMIN D (ERGOCALCIFEROL) 1.25 MG (50000 UNIT) PO CAPS: 50000.0000 [IU] | ORAL_CAPSULE | ORAL | 0 refills | Status: AC

## 2023-11-20 ENCOUNTER — Other Ambulatory Visit: Payer: Self-pay | Admitting: *Deleted

## 2023-11-20 DIAGNOSIS — E559 Vitamin D deficiency, unspecified: Secondary | ICD-10-CM

## 2023-11-23 ENCOUNTER — Telehealth: Payer: Self-pay | Admitting: *Deleted

## 2023-11-23 NOTE — Telephone Encounter (Signed)
Copied from CRM [id number]. Topic: General - Other >> Nov 23, 2023  9:38 AM Florestine Avers wrote: Reason for CRM: Patient had a missed call from the clinic and is requesting a call back.

## 2023-11-27 NOTE — Telephone Encounter (Signed)
I did not.  I looks like he had lab results and took a look at them.

## 2024-01-25 ENCOUNTER — Other Ambulatory Visit: Payer: Self-pay | Admitting: Internal Medicine

## 2024-01-25 DIAGNOSIS — E559 Vitamin D deficiency, unspecified: Secondary | ICD-10-CM

## 2024-01-28 ENCOUNTER — Other Ambulatory Visit: Payer: Self-pay | Admitting: Internal Medicine

## 2024-01-28 DIAGNOSIS — E559 Vitamin D deficiency, unspecified: Secondary | ICD-10-CM

## 2024-01-30 ENCOUNTER — Other Ambulatory Visit: Payer: Self-pay | Admitting: Internal Medicine

## 2024-01-30 ENCOUNTER — Other Ambulatory Visit (INDEPENDENT_AMBULATORY_CARE_PROVIDER_SITE_OTHER)

## 2024-01-30 DIAGNOSIS — E559 Vitamin D deficiency, unspecified: Secondary | ICD-10-CM

## 2024-01-30 LAB — VITAMIN D 25 HYDROXY (VIT D DEFICIENCY, FRACTURES): VITD: 40.27 ng/mL (ref 30.00–100.00)

## 2024-02-03 ENCOUNTER — Other Ambulatory Visit: Payer: Self-pay | Admitting: Internal Medicine

## 2024-02-03 DIAGNOSIS — E559 Vitamin D deficiency, unspecified: Secondary | ICD-10-CM

## 2024-02-27 ENCOUNTER — Ambulatory Visit: Payer: Medicare Other | Attending: Cardiology | Admitting: Cardiology

## 2024-02-27 ENCOUNTER — Encounter: Payer: Self-pay | Admitting: Cardiology

## 2024-02-27 VITALS — BP 104/60 | HR 60 | Ht 70.0 in | Wt 148.0 lb

## 2024-02-27 DIAGNOSIS — I442 Atrioventricular block, complete: Secondary | ICD-10-CM | POA: Diagnosis not present

## 2024-02-27 LAB — CUP PACEART INCLINIC DEVICE CHECK
Date Time Interrogation Session: 20250422150708
Implantable Lead Connection Status: 753985
Implantable Lead Connection Status: 753985
Implantable Lead Implant Date: 20240111
Implantable Lead Implant Date: 20240111
Implantable Lead Location: 753859
Implantable Lead Location: 753860
Implantable Pulse Generator Implant Date: 20240111
Pulse Gen Model: 2272
Pulse Gen Serial Number: 8145709

## 2024-02-27 NOTE — Progress Notes (Signed)
  Electrophysiology Office Note:   Date:  02/27/2024  ID:  Connor Neal, DOB Apr 15, 1931, MRN [id number]  Primary Cardiologist: None Primary Heart Failure: None Electrophysiologist: Keaghan Staton Cortland Ding, MD      History of Present Illness:   Connor Neal is a 89 y.o. male with h/o hypertension, hyperlipidemia complete heart block seen today for routine electrophysiology followup.   Since last being seen in our clinic the patient reports doing well.  He is able to do all of his daily activities.  He does continue to complain of chest discomfort.  The discomfort happens few times a month and lasts for hours at a time.  There is no relation with exertion.  He continues to do all of his daily activities.  Pain does not improve with rest.  He thinks that getting more sleep would help with his discomfort..  he denies palpitations, dyspnea, PND, orthopnea, nausea, vomiting, dizziness, syncope, edema, weight gain, or early satiety.   Review of systems complete and found to be negative unless listed in HPI.      EP Information / Studies Reviewed:    EKG is ordered today. Personal review as below.      PPM Interrogation-  reviewed in detail today,  See PACEART report.  Device History: Abbott Dual Chamber PPM implanted 11/17/22 for CHB  Risk Assessment/Calculations:              Physical Exam:   VS:  BP 104/60 (BP Location: Right Arm, Patient Position: Sitting, Cuff Size: Normal)   Pulse 60   Ht 5\' 10"  (1.778 m)   Wt 148 lb (67.1 kg)   SpO2 96%   BMI 21.24 kg/m    Wt Readings from Last 3 Encounters:  02/27/24 148 lb (67.1 kg)  11/09/23 149 lb 4.8 oz (67.7 kg)  02/16/23 149 lb (67.6 kg)     GEN: Well nourished, well developed in no acute distress NECK: No JVD; No carotid bruits CARDIAC: Regular rate and rhythm, no murmurs, rubs, gallops RESPIRATORY:  Clear to auscultation without rales, wheezing or rhonchi  ABDOMEN: Soft, non-tender, non-distended EXTREMITIES:   No edema; No deformity   ASSESSMENT AND PLAN:    CHB s/p Abbott PPM  Normal PPM function Sensing, threshold, impedance within normal limits Programming reviewed and appropriate per patient See Valeta Gaudier Art report No changes today  2.  Hypertension: Well-controlled  Disposition:   Follow up with EP APP in 12 months  Signed, Verita Kuroda Cortland Ding, MD

## 2024-02-29 ENCOUNTER — Ambulatory Visit (INDEPENDENT_AMBULATORY_CARE_PROVIDER_SITE_OTHER)

## 2024-02-29 DIAGNOSIS — I442 Atrioventricular block, complete: Secondary | ICD-10-CM

## 2024-03-01 LAB — CUP PACEART REMOTE DEVICE CHECK
Battery Remaining Longevity: 86 mo
Battery Remaining Percentage: 86 %
Battery Voltage: 2.99 V
Brady Statistic AP VP Percent: 40 %
Brady Statistic AP VS Percent: 1 %
Brady Statistic AS VP Percent: 60 %
Brady Statistic AS VS Percent: 1 %
Brady Statistic RA Percent Paced: 40 %
Brady Statistic RV Percent Paced: 99 %
Date Time Interrogation Session: 20250424140038
Implantable Lead Connection Status: 753985
Implantable Lead Connection Status: 753985
Implantable Lead Implant Date: 20240111
Implantable Lead Implant Date: 20240111
Implantable Lead Location: 753859
Implantable Lead Location: 753860
Implantable Pulse Generator Implant Date: 20240111
Lead Channel Impedance Value: 340 Ohm
Lead Channel Impedance Value: 430 Ohm
Lead Channel Pacing Threshold Amplitude: 0.5 V
Lead Channel Pacing Threshold Amplitude: 1.375 V
Lead Channel Pacing Threshold Pulse Width: 0.5 ms
Lead Channel Pacing Threshold Pulse Width: 0.5 ms
Lead Channel Sensing Intrinsic Amplitude: 1.6 mV
Lead Channel Sensing Intrinsic Amplitude: 8.4 mV
Lead Channel Setting Pacing Amplitude: 1.625
Lead Channel Setting Pacing Amplitude: 2.5 V
Lead Channel Setting Pacing Pulse Width: 0.5 ms
Lead Channel Setting Sensing Sensitivity: 2 mV
Pulse Gen Model: 2272
Pulse Gen Serial Number: 8145709

## 2024-04-09 NOTE — Addendum Note (Signed)
 Addended by: Lott Rouleau A on: 04/09/2024 03:51 PM   Modules accepted: Orders

## 2024-04-09 NOTE — Progress Notes (Signed)
 Remote pacemaker transmission.

## 2024-05-30 ENCOUNTER — Ambulatory Visit

## 2024-05-30 DIAGNOSIS — I442 Atrioventricular block, complete: Secondary | ICD-10-CM

## 2024-05-31 LAB — CUP PACEART REMOTE DEVICE CHECK
Battery Remaining Longevity: 84 mo
Battery Remaining Percentage: 83 %
Battery Voltage: 2.99 V
Brady Statistic AP VP Percent: 45 %
Brady Statistic AP VS Percent: 1 %
Brady Statistic AS VP Percent: 55 %
Brady Statistic AS VS Percent: 1 %
Brady Statistic RA Percent Paced: 44 %
Brady Statistic RV Percent Paced: 99 %
Date Time Interrogation Session: 20250724020014
Implantable Lead Connection Status: 753985
Implantable Lead Connection Status: 753985
Implantable Lead Implant Date: 20240111
Implantable Lead Implant Date: 20240111
Implantable Lead Location: 753859
Implantable Lead Location: 753860
Implantable Pulse Generator Implant Date: 20240111
Lead Channel Impedance Value: 340 Ohm
Lead Channel Impedance Value: 430 Ohm
Lead Channel Pacing Threshold Amplitude: 0.5 V
Lead Channel Pacing Threshold Amplitude: 1.25 V
Lead Channel Pacing Threshold Pulse Width: 0.5 ms
Lead Channel Pacing Threshold Pulse Width: 0.5 ms
Lead Channel Sensing Intrinsic Amplitude: 2.6 mV
Lead Channel Sensing Intrinsic Amplitude: 4.2 mV
Lead Channel Setting Pacing Amplitude: 1.5 V
Lead Channel Setting Pacing Amplitude: 2.5 V
Lead Channel Setting Pacing Pulse Width: 0.5 ms
Lead Channel Setting Sensing Sensitivity: 2 mV
Pulse Gen Model: 2272
Pulse Gen Serial Number: 8145709

## 2024-06-03 ENCOUNTER — Ambulatory Visit: Payer: Self-pay | Admitting: Cardiology

## 2024-08-08 NOTE — Progress Notes (Signed)
 Remote PPM Transmission

## 2024-08-29 ENCOUNTER — Ambulatory Visit

## 2024-08-29 DIAGNOSIS — I442 Atrioventricular block, complete: Secondary | ICD-10-CM | POA: Diagnosis not present

## 2024-08-29 LAB — CUP PACEART REMOTE DEVICE CHECK
Battery Remaining Longevity: 83 mo
Battery Remaining Percentage: 80 %
Battery Voltage: 2.99 V
Brady Statistic AP VP Percent: 47 %
Brady Statistic AP VS Percent: 1 %
Brady Statistic AS VP Percent: 52 %
Brady Statistic AS VS Percent: 1 %
Brady Statistic RA Percent Paced: 47 %
Brady Statistic RV Percent Paced: 99 %
Date Time Interrogation Session: 20251023020014
Implantable Lead Connection Status: 753985
Implantable Lead Connection Status: 753985
Implantable Lead Implant Date: 20240111
Implantable Lead Implant Date: 20240111
Implantable Lead Location: 753859
Implantable Lead Location: 753860
Implantable Pulse Generator Implant Date: 20240111
Lead Channel Impedance Value: 350 Ohm
Lead Channel Impedance Value: 440 Ohm
Lead Channel Pacing Threshold Amplitude: 0.5 V
Lead Channel Pacing Threshold Amplitude: 1.125 V
Lead Channel Pacing Threshold Pulse Width: 0.5 ms
Lead Channel Pacing Threshold Pulse Width: 0.5 ms
Lead Channel Sensing Intrinsic Amplitude: 1.9 mV
Lead Channel Sensing Intrinsic Amplitude: 7.7 mV
Lead Channel Setting Pacing Amplitude: 1.375
Lead Channel Setting Pacing Amplitude: 2.5 V
Lead Channel Setting Pacing Pulse Width: 0.5 ms
Lead Channel Setting Sensing Sensitivity: 2 mV
Pulse Gen Model: 2272
Pulse Gen Serial Number: 8145709

## 2024-08-31 NOTE — Progress Notes (Signed)
 Remote PPM Transmission

## 2024-09-02 ENCOUNTER — Ambulatory Visit: Payer: Self-pay | Admitting: Cardiology

## 2024-09-26 ENCOUNTER — Ambulatory Visit: Payer: Self-pay

## 2024-09-26 NOTE — Telephone Encounter (Signed)
 FYI Only or Action Required?: Action required by provider: request for appointment.  Patient was last seen in primary care on 11/09/2023 by Theophilus Andrews, Tully GRADE, MD.  Called Nurse Triage reporting Abdominal Pain.  Symptoms began several days ago.  Interventions attempted: Nothing.  Symptoms are: unchanged.  Triage Disposition: See Physician Within 24 Hours  Patient/caregiver understands and will follow disposition?: No, wishes to speak with PCP   Copied from CRM #[id number]. Topic: Clinical - Red Word Triage >> Sep 26, 2024 11:54 AM Mia F wrote: Red Word that prompted transfer to Nurse Triage: Having pain and discomfort in lower part of his left stomach. He says something is bothering me inside. Has been going on for 3 weeks. Does not seem to be getting worse but not getting better. HE also mentions that he is losing weight without trying.  He also mentions tingling in his prostate. Reason for Disposition  [1] MODERATE pain (e.g., interferes with normal activities) AND [2] pain comes and goes (cramps) AND [3] present > 24 hours  (Exception: Pain with Vomiting or Diarrhea - see that Guideline.)  Answer Assessment - Initial Assessment Questions No available appts with pcp today. Offered alternative provider, patient declines and request appt with Dr. Jordan.  Advised call back or UC/ED if symptoms worsen.  1. LOCATION: Where does it hurt?      Lower left abdomen, belly button; intermittent pain, reports left side feels different from right;  feels like carrying weight; started month ago;  Last BM today, denies bleeding, diarrhea, bloating, distended  Weight loss-lost 8lbs without trying, able to eat and drink, slight decrease in appetite  Able to stand and walk, denies unsteadiness, blurred vision, HA, dizziness, weakness/ numbness 2. RADIATION: Does the pain shoot anywhere else? (e.g., chest, back)     no 3. ONSET: When did the pain begin? (Minutes, hours or days ago)       3 weeks  5. PATTERN Does the pain come and go, or is it constant?     Comes and goes 6. SEVERITY: How bad is the pain?  (e.g., Scale 1-10; mild, moderate, or severe)     No pain currently  8. CAUSE: What do you think is causing the stomach pain? (e.g., gallstones, recent abdominal surgery)     unsure  10. OTHER SYMPTOMS: Do you have any other symptoms? (e.g., back pain, diarrhea, fever, urination pain, vomiting)       denies  Protocols used: Abdominal Pain - Male-A-AH

## 2024-09-27 NOTE — Telephone Encounter (Signed)
 If pain gets worse or vomiting/fever, he needs to seek immediate medical attention. I can see him Monday. Thanks, BJ

## 2024-09-27 NOTE — Telephone Encounter (Signed)
 Spoke with patient. Advised patient of Dr. Gib response. Patient states he would like to be seen on Monday. Appt scheduled.

## 2024-09-30 ENCOUNTER — Ambulatory Visit: Admitting: Family Medicine

## 2024-10-01 ENCOUNTER — Encounter: Payer: Self-pay | Admitting: Family Medicine

## 2024-10-01 ENCOUNTER — Ambulatory Visit (INDEPENDENT_AMBULATORY_CARE_PROVIDER_SITE_OTHER): Admitting: Family Medicine

## 2024-10-01 VITALS — BP 128/70 | HR 59 | Temp 97.9°F | Resp 16 | Ht 70.0 in | Wt 144.8 lb

## 2024-10-01 DIAGNOSIS — Z95811 Presence of heart assist device: Secondary | ICD-10-CM | POA: Diagnosis not present

## 2024-10-01 DIAGNOSIS — R1032 Left lower quadrant pain: Secondary | ICD-10-CM

## 2024-10-01 DIAGNOSIS — R300 Dysuria: Secondary | ICD-10-CM | POA: Diagnosis not present

## 2024-10-01 DIAGNOSIS — N1831 Chronic kidney disease, stage 3a: Secondary | ICD-10-CM

## 2024-10-01 DIAGNOSIS — R0789 Other chest pain: Secondary | ICD-10-CM

## 2024-10-01 LAB — POCT URINALYSIS DIPSTICK
Blood, UA: NEGATIVE
Glucose, UA: NEGATIVE
Ketones, UA: NEGATIVE
Leukocytes, UA: NEGATIVE
Nitrite, UA: NEGATIVE
Protein, UA: POSITIVE — AB
Spec Grav, UA: 1.025 (ref 1.010–1.025)
Urobilinogen, UA: 0.2 U/dL
pH, UA: 6 (ref 5.0–8.0)

## 2024-10-01 NOTE — Assessment & Plan Note (Signed)
 Stable. Last e GFR 54 and Cr 1.1 in 11/2022. Continue adequate hydration, low salt diet,and avoidance of NSAID's.

## 2024-10-01 NOTE — Assessment & Plan Note (Signed)
 Complete AV block s/p pacemaker placement. Follows with cardiologist regularly.

## 2024-10-01 NOTE — Patient Instructions (Addendum)
 A few things to remember from today's visit:  Presence of heart assist device [facility]) - Plan: POCT Urinalysis Dipstick  Stage 3a chronic kidney disease (HCC)  Dysuria - Plan: Culture, Urine  Left lower quadrant abdominal pain - Plan: Comprehensive metabolic panel with GFR, CBC, CT ABDOMEN PELVIS W CONTRAST  Monitor for new symptoms. Tylenol  if needed 500 mg 3-4 times. If suddenly worse, seek immediate medical attention.  Arrange appt with your cardiologist if chest pain becomes more frequent or new symptoms present.  If you need refills for medications you take chronically, please call your pharmacy. Do not use My Chart to request refills or for acute issues that need immediate attention. If you send a my chart message, it may take a few days to be addressed, specially if I am not in the office.  Please be sure medication list is accurate. If a new problem present, please set up appointment sooner than planned today.

## 2024-10-01 NOTE — Assessment & Plan Note (Signed)
 C/O a month of LLQ abdominal pain. Pain is not elicited today on examination. We discussed possible etiologies, including diverticulosis as well as more serious process given his age and mild wt loss. Abdominal CT w contrast will be arranged. Clearly instructed about warning signs.

## 2024-10-01 NOTE — Progress Notes (Signed)
 d  ACUTE VISIT Chief Complaint  Patient presents with   Abdominal Pain   Discussed the use of AI scribe software for clinical note transcription with the patient, who gave verbal consent to proceed. History of Present Illness Amer Alcindor is a 89 year old male PMHx significant for hx significant for HTN,hx of PE, PAD,GERD, BPH,OA, and complete heart block s/p pacemaker placement here today complaining of abdominal pain. Since his last visit on 11/30/2022, he has seen his cardiology regularly.  He has been experiencing left lower quadrant abdominal pain for approximately one month. The pain is described as dull and achy, with an intensity of 8 out of 10. It is intermittent and does not radiate. The pain is not affected by defecation, it is not worse with movement, sometimes occurs after eating without a consistent pattern. No associated fever, chills, nausea, vomiting, or changes in bowel habits.  He experiences some burning during urination, which began after discontinuing Flomax  a few weeks ago. There are no changes in urine frequency or gross hematuria.  He reports occasional chest pain, described as sharp and sometimes accompanied by a feeling of heaviness in the chest. This problem has been going on for a few months, has not identified exacerbating or alleviating factors. Not sure about duration.The pain does not radiate to the arm or neck.No associated symptoms, happens at rest or when walking. Last visit with cardiologist 02/2024. His pacemaker is checked remotely.  -He notes a decrease in weight from 150 pounds to 138 pounds over the past year, attributing this to his eating habits. He tries to eat two full meals a day and a snack. He is currently taking vitamin D  and magnesium supplements. Negative for fever or night sweats.  He mentions that he has a history of back pain that occurs when lifting heavy objects, which is not associated with his current abdominal pain.  He lives  alone and independent ADL's, he drives himself.  Lab Results  Component Value Date   NA 138 11/30/2022   CL 105 11/30/2022   K 4.3 11/30/2022   CO2 25 11/30/2022   BUN 32 (H) 11/30/2022   CREATININE 1.18 11/30/2022   GFR 54.63 (L) 11/30/2022   CALCIUM 9.1 11/30/2022   ALBUMIN 3.1 (L) 11/16/2022   GLUCOSE 110 (H) 11/30/2022   Review of Systems  Constitutional:  Positive for unexpected weight change. Negative for activity change, appetite change, chills and fever.  HENT:  Negative for sore throat and trouble swallowing.   Respiratory:  Negative for cough and shortness of breath.   Gastrointestinal:  Positive for abdominal pain.  Genitourinary:  Negative for decreased urine volume and hematuria.  Skin:  Negative for rash.  Neurological:  Negative for syncope and weakness.  Psychiatric/Behavioral:  Negative for confusion and hallucinations.   See other pertinent positives and negatives in HPI.  Current Outpatient Medications on File Prior to Visit  Medication Sig Dispense Refill   acetaminophen  (TYLENOL ) 325 MG tablet Take 650 mg by mouth every 4 (four) hours as needed for mild pain or moderate pain.      tamsulosin  (FLOMAX ) 0.4 MG CAPS capsule Take 0.4 mg by mouth. (Patient not taking: Reported on 10/01/2024)     No current facility-administered medications on file prior to visit.    Past Medical History:  Diagnosis Date   Arthritis    BENIGN PROSTATIC HYPERTROPHY 02/13/2008   COLONIC POLYPS 01/04/2008   Diverticulosis of colon (without mention of hemorrhage) 01/04/2008   Frequent headaches  GERD 11/17/2010   Hyperlipidemia    Internal hemorrhoids with other complication 01/04/2008   PROSTATITIS, RECURRENT 01/04/2008   Pulmonary embolism (HCC)    PVD 08/17/2010   TOBACCO USE, QUIT 08/14/2009   Allergies  Allergen Reactions   Terazosin     Social History   Socioeconomic History   Marital status: Single    Spouse name: Not on file   Number of children: 3   Years of  education: Not on file   Highest education level: Not on file  Occupational History   Not on file  Tobacco Use   Smoking status: Former    Current packs/day: 0.00    Types: Cigarettes    Quit date: 1958    Years since quitting: 67.9   Smokeless tobacco: Never  Substance and Sexual Activity   Alcohol use: No   Drug use: No   Sexual activity: Not on file  Other Topics Concern   Not on file  Social History Narrative   Not on file   Social Drivers of Health   Financial Resource Strain: Low Risk  (07/02/2021)   Overall Financial Resource Strain (CARDIA)    Difficulty of Paying Living Expenses: Not hard at all  Food Insecurity: No Food Insecurity (11/21/2022)   Hunger Vital Sign    Worried About Running Out of Food in the Last Year: Never true    Ran Out of Food in the Last Year: Never true  Transportation Needs: No Transportation Needs (11/21/2022)   PRAPARE - Administrator, Civil Service (Medical): No    Lack of Transportation (Non-Medical): No  Physical Activity: Not on file  Stress: Not on file  Social Connections: Not on file    Vitals:   10/01/24 1501  BP: 128/70  Pulse: (!) 59  Resp: 16  Temp: 97.9 F (36.6 C)  SpO2: 99%   Wt Readings from Last 3 Encounters:  10/01/24 144 lb 12.8 oz (65.7 kg)  02/27/24 148 lb (67.1 kg)  11/09/23 149 lb 4.8 oz (67.7 kg)   Body mass index is 20.78 kg/m.  Physical Exam Vitals and nursing note reviewed.  Constitutional:      General: He is not in acute distress.    Appearance: He is well-developed.  HENT:     Head: Normocephalic and atraumatic.     Mouth/Throat:     Mouth: Mucous membranes are moist.     Pharynx: Oropharynx is clear.  Eyes:     Conjunctiva/sclera: Conjunctivae normal.  Cardiovascular:     Rate and Rhythm: Regular rhythm. Bradycardia present.     Pulses:          Dorsalis pedis pulses are 2+ on the right side and 2+ on the left side.     Heart sounds: Murmur (SEM I/VI RUSB and LUSB) heard.   Pulmonary:     Effort: Pulmonary effort is normal. No respiratory distress.     Breath sounds: Normal breath sounds.  Abdominal:     Palpations: Abdomen is soft. There is no mass.     Tenderness: There is no abdominal tenderness.  Lymphadenopathy:     Cervical: No cervical adenopathy.  Skin:    General: Skin is warm.     Findings: No erythema or rash.  Neurological:     Mental Status: He is alert and oriented to person, place, and time.     Cranial Nerves: No cranial nerve deficit.     Gait: Gait normal.  Psychiatric:  Mood and Affect: Mood and affect normal.   ASSESSMENT AND PLAN:  Mr. Mian was seen today for LLQ abdominal pain and follow up.  Orders Placed This Encounter  Procedures   Culture, Urine   CT ABDOMEN PELVIS W CONTRAST   Comprehensive metabolic panel with GFR   CBC   POCT Urinalysis Dipstick   Lab Results  Component Value Date   WBC 5.7 10/01/2024   HGB 12.8 (L) 10/01/2024   HCT 38.6 (L) 10/01/2024   MCV 89.9 10/01/2024   PLT 148.0 (L) 10/01/2024   Lab Results  Component Value Date   NA 139 10/01/2024   CL 104 10/01/2024   K 4.3 10/01/2024   CO2 28 10/01/2024   BUN 30 (H) 10/01/2024   CREATININE 1.31 10/01/2024   GFR 47.57 (L) 10/01/2024   CALCIUM 9.4 10/01/2024   ALBUMIN 4.0 10/01/2024   GLUCOSE 77 10/01/2024   Lab Results  Component Value Date   ALT 10 10/01/2024   AST 21 10/01/2024   ALKPHOS 80 10/01/2024   BILITOT 0.6 10/01/2024   Left lower quadrant abdominal pain Assessment & Plan: C/O a month of LLQ abdominal pain. Pain is not elicited today on examination. We discussed possible etiologies, including diverticulosis as well as more serious process given his age and mild wt loss. Abdominal CT w contrast will be arranged. Clearly instructed about warning signs.   Orders: -     Comprehensive metabolic panel with GFR; Future -     CBC; Future -     CT ABDOMEN PELVIS W CONTRAST; Future  Presence of heart assist device  [facility]) Assessment & Plan: Complete AV block s/p pacemaker placement. Follows with cardiologist regularly.  Orders: -     POCT urinalysis dipstick  Stage 3a chronic kidney disease (HCC) Assessment & Plan: Stable. Last e GFR 54 and Cr 1.1 in 11/2022. Continue adequate hydration, low salt diet,and avoidance of NSAID's.  Orders: -     Comprehensive metabolic panel with GFR; Future  Dysuria Problem has been going on since he stopped Flomax . No associated gross hematuria or changes in frequency. Will follow UA/Ucx.  -     Urine Culture  Other chest pain Problem has been going on for a few months. We discussed possible etiologies. Instructed to arrange an appt with his cardiologist. Clearly instructed about warning signs.  In regard to wt changes, last visit here his wt was 150 (11/30/22), has lost 5-6 Lb since then. Explained that could be related to decreased oral intake but given his age we also need to consider a more serious process. Stressed the importance of adequate protein intake. He sees PCP regularly at the [facility].  Return if symptoms worsen or fail to improve, for keep next appointment.  Shardae Kleinman G. Gerad Cornelio, MD  [facility]. [place] office.

## 2024-10-02 ENCOUNTER — Ambulatory Visit: Payer: Self-pay | Admitting: Family Medicine

## 2024-10-02 ENCOUNTER — Ambulatory Visit
Admission: RE | Admit: 2024-10-02 | Discharge: 2024-10-02 | Disposition: A | Discharge status: Home / Self Care | Source: Ambulatory Visit | Attending: Family Medicine | Admitting: Family Medicine

## 2024-10-02 DIAGNOSIS — R1032 Left lower quadrant pain: Secondary | ICD-10-CM | POA: Insufficient documentation

## 2024-10-02 LAB — URINE CULTURE
MICRO NUMBER:: 17281496
Result:: NO GROWTH
SPECIMEN QUALITY:: ADEQUATE

## 2024-10-02 LAB — COMPREHENSIVE METABOLIC PANEL WITH GFR
ALT: 10 U/L (ref 0–53)
AST: 21 U/L (ref 0–37)
Albumin: 4 g/dL (ref 3.5–5.2)
Alkaline Phosphatase: 80 U/L (ref 39–117)
BUN: 30 mg/dL — ABNORMAL HIGH (ref 6–23)
CO2: 28 meq/L (ref 19–32)
Calcium: 9.4 mg/dL (ref 8.4–10.5)
Chloride: 104 meq/L (ref 96–112)
Creatinine, Ser: 1.31 mg/dL (ref 0.40–1.50)
GFR: 47.57 mL/min — ABNORMAL LOW (ref >60.00)
Glucose, Bld: 77 mg/dL (ref 70–99)
Potassium: 4.3 meq/L (ref 3.5–5.1)
Sodium: 139 meq/L (ref 135–145)
Total Bilirubin: 0.6 mg/dL (ref 0.2–1.2)
Total Protein: 7.3 g/dL (ref 6.0–8.3)

## 2024-10-02 LAB — CBC
HCT: 38.6 % — ABNORMAL LOW (ref 39.0–52.0)
Hemoglobin: 12.8 g/dL — ABNORMAL LOW (ref 13.0–17.0)
MCHC: 33 g/dL (ref 30.0–36.0)
MCV: 89.9 fl (ref 78.0–100.0)
Platelets: 148 K/uL — ABNORMAL LOW (ref 150.0–400.0)
RBC: 4.3 Mil/uL (ref 4.22–5.81)
RDW: 14.4 % (ref 11.5–15.5)
WBC: 5.7 K/uL (ref 4.0–10.5)

## 2024-10-02 MED ORDER — IOHEXOL 300 MG/ML  SOLN
100.0000 mL | Freq: Once | INTRAMUSCULAR | Status: AC | PRN
  Administered 2024-10-02: 100 mL via INTRAVENOUS

## 2024-10-14 ENCOUNTER — Encounter: Payer: Self-pay | Admitting: Family Medicine

## 2024-10-17 ENCOUNTER — Telehealth: Payer: Self-pay

## 2024-10-17 NOTE — Telephone Encounter (Signed)
 Copied from CRM #[id number]. Topic: Clinical - Medical Advice >> Oct 17, 2024  8:56 AM Zy'onna H wrote: Reason for CRM: Patient daughter called in on behalf of her father.  She stated it appears Connor Neal, Connor MATSU, Connor Neal discussed Stage 3A Chronic Kidney Disease with the patient 10/01/2024, but he cold not recall and fully understand what he was told. She followed up on the information given 10/14/2024.   She stated she hasn't heard anything back from Jordan Connor Neal, but daughter would like to know -  Is this a new diagnosis?  Protein was found in his urine sample.  What do we need to understand about the kidney disease and the protein? We can make another appointment with me accompanying him if necessary.  Please give her a callback at:  [phone]

## 2024-10-17 NOTE — Telephone Encounter (Signed)
 Copied from CRM #[id number]. Topic: Clinical - Medical Advice >> Oct 17, 2024  8:56 AM Zy'onna H wrote: Reason for CRM: Patient daughter called in on behalf of her father.  She stated it appears Jordan, Connor MATSU, MD discussed Stage 3A Chronic Kidney Disease with the patient 10/01/2024, but he cold not recall and fully understand what he was told. She followed up on the information given 10/14/2024.   She stated she hasn't heard anything back from Jordan MD, but daughter would like to know -  Is this a new diagnosis?  Protein was found in his urine sample.  What do we need to understand about the kidney disease and the protein? We can make another appointment with me accompanying him if necessary.  Please give her a callback at:  [phone] >> Oct 17, 2024  4:32 PM Rea ORN wrote: Pt daughter calling again to follow up. Please call back [phone] or respond to message that was sent within Mychart on 10/14/24

## 2024-10-18 NOTE — Telephone Encounter (Signed)
 Reviewing prior records, it seems like he has had a mildly abnormal eGFR,mid 50's, it has been stable. Continue adequate hydration, low-salt diet, and avoidance of NSAIDs. I have been following renal function regularly and I believe he also sees provider at the [facility]. We can discussed this problem in detail during his next follow-up visit, before if there are further concerns. Thanks, BJ

## 2024-10-18 NOTE — Telephone Encounter (Signed)
 LVMTRC

## 2024-10-18 NOTE — Telephone Encounter (Signed)
 Spoke with the patients daughter informed her of Dr. Gib message. Daughter voiced understanding and did not have any further questions.

## 2024-10-18 NOTE — Telephone Encounter (Signed)
 Spoke with the patients daughter informed her of Dr. Gib message from previous tel encounter. Daughter voiced understanding and did not have any further questions.

## 2024-10-18 NOTE — Telephone Encounter (Signed)
 See previous tel encounter with same concerns. BJ

## 2024-11-18 ENCOUNTER — Encounter: Payer: Self-pay | Admitting: Family Medicine

## 2024-11-28 ENCOUNTER — Ambulatory Visit

## 2024-11-28 ENCOUNTER — Ambulatory Visit: Payer: Self-pay | Admitting: Cardiology

## 2024-11-28 DIAGNOSIS — I442 Atrioventricular block, complete: Secondary | ICD-10-CM

## 2024-11-28 LAB — CUP PACEART REMOTE DEVICE CHECK
Battery Remaining Longevity: 80 mo
Battery Remaining Percentage: 78 %
Battery Voltage: 2.98 V
Brady Statistic AP VP Percent: 47 %
Brady Statistic AP VS Percent: 1 %
Brady Statistic AS VP Percent: 52 %
Brady Statistic AS VS Percent: 1 %
Brady Statistic RA Percent Paced: 46 %
Brady Statistic RV Percent Paced: 99 %
Date Time Interrogation Session: 20260122020021
Implantable Lead Connection Status: 753985
Implantable Lead Connection Status: 753985
Implantable Lead Implant Date: 20240111
Implantable Lead Implant Date: 20240111
Implantable Lead Location: 753859
Implantable Lead Location: 753860
Implantable Pulse Generator Implant Date: 20240111
Lead Channel Impedance Value: 350 Ohm
Lead Channel Impedance Value: 430 Ohm
Lead Channel Pacing Threshold Amplitude: 0.5 V
Lead Channel Pacing Threshold Amplitude: 1.25 V
Lead Channel Pacing Threshold Pulse Width: 0.5 ms
Lead Channel Pacing Threshold Pulse Width: 0.5 ms
Lead Channel Sensing Intrinsic Amplitude: 2.7 mV
Lead Channel Sensing Intrinsic Amplitude: 4.2 mV
Lead Channel Setting Pacing Amplitude: 1.5 V
Lead Channel Setting Pacing Amplitude: 2.5 V
Lead Channel Setting Pacing Pulse Width: 0.5 ms
Lead Channel Setting Sensing Sensitivity: 2 mV
Pulse Gen Model: 2272
Pulse Gen Serial Number: 8145709

## 2024-11-30 NOTE — Progress Notes (Signed)
 Remote PPM Transmission

## 2025-02-27 ENCOUNTER — Ambulatory Visit

## 2025-03-05 ENCOUNTER — Ambulatory Visit: Admitting: Cardiology

## 2025-05-29 ENCOUNTER — Ambulatory Visit

## 2025-08-28 ENCOUNTER — Ambulatory Visit

## 2025-11-27 ENCOUNTER — Ambulatory Visit

## 2026-02-26 ENCOUNTER — Ambulatory Visit
